# Patient Record
Sex: Male | Born: 1950 | Race: White | Hispanic: No | Marital: Married | Smoking: Former smoker
Health system: Southern US, Community
[De-identification: ages and names within clinical notes are randomized; demographics above are authoritative.]

## PROBLEM LIST (undated history)

## (undated) DIAGNOSIS — K5900 Constipation, unspecified: Secondary | ICD-10-CM

## (undated) DIAGNOSIS — I4510 Unspecified right bundle-branch block: Secondary | ICD-10-CM

## (undated) DIAGNOSIS — E785 Hyperlipidemia, unspecified: Secondary | ICD-10-CM

## (undated) DIAGNOSIS — E1140 Type 2 diabetes mellitus with diabetic neuropathy, unspecified: Secondary | ICD-10-CM

## (undated) DIAGNOSIS — R319 Hematuria, unspecified: Secondary | ICD-10-CM

## (undated) DIAGNOSIS — L0390 Cellulitis, unspecified: Secondary | ICD-10-CM

## (undated) DIAGNOSIS — I776 Arteritis, unspecified: Secondary | ICD-10-CM

## (undated) DIAGNOSIS — C679 Malignant neoplasm of bladder, unspecified: Secondary | ICD-10-CM

## (undated) DIAGNOSIS — E119 Type 2 diabetes mellitus without complications: Secondary | ICD-10-CM

## (undated) HISTORY — PX: TONSILLECTOMY: SUR1361

---

## 1978-05-28 HISTORY — PX: MOUTH SURGERY: SHX715

## 2008-03-01 ENCOUNTER — Ambulatory Visit: Payer: Self-pay | Admitting: Internal Medicine

## 2008-03-01 LAB — CONVERTED CEMR LAB
ALT: 22 units/L (ref 0–53)
Albumin: 4 g/dL (ref 3.5–5.2)
Basophils Absolute: 0 10*3/uL (ref 0.0–0.1)
Basophils Relative: 0.3 % (ref 0.0–3.0)
Calcium: 9.2 mg/dL (ref 8.4–10.5)
Creatinine, Ser: 0.9 mg/dL (ref 0.4–1.5)
Direct LDL: 131.8 mg/dL
Eosinophils Absolute: 0.3 10*3/uL (ref 0.0–0.7)
GFR calc Af Amer: 111 mL/min
HDL: 55.5 mg/dL (ref >39.0)
Hemoglobin, Urine: NEGATIVE
Leukocytes, UA: NEGATIVE
Monocytes Absolute: 0.7 10*3/uL (ref 0.1–1.0)
Neutro Abs: 6.4 10*3/uL (ref 1.4–7.7)
Neutrophils Relative %: 65 % (ref 43.0–77.0)
Platelets: 249 10*3/uL (ref 150–400)
RDW: 11.7 % (ref 11.5–14.6)
Specific Gravity, Urine: >=1.03 (ref 1.000–1.03)
Total CHOL/HDL Ratio: 3.9
Triglycerides: 140 mg/dL (ref 0–149)
Urine Glucose: >=1000 mg/dL — CR

## 2008-03-08 ENCOUNTER — Ambulatory Visit: Payer: Self-pay | Admitting: Internal Medicine

## 2008-03-08 DIAGNOSIS — E785 Hyperlipidemia, unspecified: Secondary | ICD-10-CM

## 2008-03-08 DIAGNOSIS — E1149 Type 2 diabetes mellitus with other diabetic neurological complication: Secondary | ICD-10-CM

## 2008-03-08 DIAGNOSIS — F17200 Nicotine dependence, unspecified, uncomplicated: Secondary | ICD-10-CM

## 2008-03-08 DIAGNOSIS — E1165 Type 2 diabetes mellitus with hyperglycemia: Secondary | ICD-10-CM

## 2015-01-17 ENCOUNTER — Ambulatory Visit (INDEPENDENT_AMBULATORY_CARE_PROVIDER_SITE_OTHER): Payer: Medicare HMO | Admitting: Family Medicine

## 2015-01-17 VITALS — BP 122/72 | HR 91 | Temp 98.1°F | Resp 17 | Ht 71.0 in | Wt 202.0 lb

## 2015-01-17 DIAGNOSIS — L03116 Cellulitis of left lower limb: Secondary | ICD-10-CM | POA: Diagnosis not present

## 2015-01-17 DIAGNOSIS — E118 Type 2 diabetes mellitus with unspecified complications: Secondary | ICD-10-CM

## 2015-01-17 LAB — POCT CBC
Granulocyte percent: 83.4 %G — AB (ref 37–80)
HEMATOCRIT: 51.2 % (ref 43.5–53.7)
HEMOGLOBIN: 16.9 g/dL (ref 14.1–18.1)
Lymph, poc: 1.6 (ref 0.6–3.4)
MCH: 30.2 pg (ref 27–31.2)
MCHC: 33 g/dL (ref 31.8–35.4)
MCV: 91.5 fL (ref 80–97)
MID (cbc): 0.3 (ref 0–0.9)
MPV: 7.2 fL (ref 0–99.8)
POC GRANULOCYTE: 9.4 — AB (ref 2–6.9)
POC LYMPH PERCENT: 13.9 %L (ref 10–50)
POC MID %: 2.7 % (ref 0–12)
Platelet Count, POC: 305 10*3/uL (ref 142–424)
RBC: 5.6 M/uL (ref 4.69–6.13)
RDW, POC: 13 %
WBC: 11.3 10*3/uL — AB (ref 4.6–10.2)

## 2015-01-17 LAB — BASIC METABOLIC PANEL
BUN: 30 mg/dL — AB (ref 7–25)
CO2: 26 mmol/L (ref 20–31)
CREATININE: 1.05 mg/dL (ref 0.70–1.25)
Calcium: 9.6 mg/dL (ref 8.6–10.3)
Chloride: 102 mmol/L (ref 98–110)
Glucose, Bld: 143 mg/dL — ABNORMAL HIGH (ref 65–99)
Potassium: 4.8 mmol/L (ref 3.5–5.3)
Sodium: 140 mmol/L (ref 135–146)

## 2015-01-17 LAB — POCT GLYCOSYLATED HEMOGLOBIN (HGB A1C): HEMOGLOBIN A1C: 7.1

## 2015-01-17 MED ORDER — MUPIROCIN 2 % EX OINT: TOPICAL_OINTMENT | CUTANEOUS | Status: DC

## 2015-01-17 MED ORDER — DOXYCYCLINE HYCLATE 100 MG PO CAPS: 100.0000 mg | ORAL_CAPSULE | Freq: Two times a day (BID) | ORAL | Status: DC

## 2015-01-17 MED ORDER — METFORMIN HCL 500 MG PO TABS: 500.0000 mg | ORAL_TABLET | Freq: Two times a day (BID) | ORAL | Status: DC

## 2015-01-17 NOTE — Progress Notes (Signed)
[facility] [address] [place] [address] [phone]  Date:  01/17/2015   Name:  Sergio Pennington   DOB:  1950/06/15   MRN:  [id number]  PCP:  Adella Hare, MD    Chief Complaint: Leg Injury and Insect Bite   History of Present Illness:  Sergio Pennington is a 64 y.o. very pleasant male patient who presents with the following:  Here today as a new patient with a couple of concerns A couple of months ago a piece of plywood fell and scraped the medial aspect of the left ankle and shin.  He treated it with peroxide, coconut oil, he thought it was getting better.  It never healed all the way, it left a scaly area on the shin.  However over the last 3-4 days it got a lot worse.  It is now red, tender, and weeping a bit.  He is able to walk but it does feel like "shin splints"   He also notes a bug bite on his right arm about 6 weeks ago-  It seemed to have a Bullseye around it and he was worried that it could be a tick.  It has now resolved except for a small red scaly mark at the site  He has a history of diet controlled DM- however admits that he has not followed up for routine care or A1c in some time, he has no idea how his numbers are now He has otherwise been a healthy person No other sx of lyme or RMSF  Patient Active Problem List   Diagnosis Date Noted  . DIAB W/O MENTION COMP TYPE II/UNS TYPE UNCNTRL 03/08/2008  . DIABETES MELLITUS, TYPE II, UNCONTROLLED, W/NEUROLO COMPS 03/08/2008  . HYPERLIPIDEMIA 03/08/2008  . TOBACCO ABUSE 03/08/2008    No past medical history on file.  No past surgical history on file.  Social History  Substance Use Topics  . Smoking status: Never Smoker   . Smokeless tobacco: None  . Alcohol Use: None    No family history on file.  Allergies  Allergen Reactions  . Sulfa Antibiotics Hives    Medication list has been reviewed and updated.  No current outpatient prescriptions on file  prior to visit.   No current facility-administered medications on file prior to visit.    Review of Systems:  As per HPI- otherwise negative.   Physical Examination: Filed Vitals:   01/17/15 1458  BP: 122/72  Pulse: 91  Temp: 98.1 F (36.7 C)  Resp: 17   Filed Vitals:   01/17/15 1458  Height: 5\' 11"  (1.803 m)  Weight: 202 lb (91.627 kg)   Body mass index is 28.19 kg/(m^2). Ideal Body Weight: Weight in (lb) to have BMI = 25: 178.9  GEN: WDWN, NAD, Non-toxic, A & O x 3, mild overweight, looks well HEENT: Atraumatic, Normocephalic. Neck supple. No masses, No LAD.  Bilateral TM wnl, oropharynx normal.  PEERL,EOMI.   Ears and Nose: No external deformity. CV: RRR, No M/G/R. No JVD. No thrill. No extra heart sounds. PULM: CTA B, no wheezes, crackles, rhonchi. No retractions. No resp. distress. No accessory muscle use. EXTR: No c/c/e NEURO Normal gait.  PSYCH: Normally interactive. Conversant. Not depressed or anxious appearing.  Calm demeanor.  Left shin, medial aspect: there is a dry and scaly patch approx 4in by 6in in size.  Part of this area is moist and weeping.  The entire area appears to be irritated and is red/  inflamed.  It is mildly tender.    Results for orders placed or performed in visit on 01/17/15  POCT CBC  Result Value Ref Range   WBC 11.3 (A) 4.6 - 10.2 K/uL   Lymph, poc 1.6 0.6 - 3.4   POC LYMPH PERCENT 13.9 10 - 50 %L   MID (cbc) 0.3 0 - 0.9   POC MID % 2.7 0 - 12 %M   POC Granulocyte 9.4 (A) 2 - 6.9   Granulocyte percent 83.4 (A) 37 - 80 %G   RBC 5.60 4.69 - 6.13 M/uL   Hemoglobin 16.9 14.1 - 18.1 g/dL   HCT, POC 51.2 43.5 - 53.7 %   MCV 91.5 80 - 97 fL   MCH, POC 30.2 27 - 31.2 pg   MCHC 33.0 31.8 - 35.4 g/dL   RDW, POC 13.0 %   Platelet Count, POC 305 142 - 424 K/uL   MPV 7.2 0 - 99.8 fL  POCT glycosylated hemoglobin (Hb A1C)  Result Value Ref Range   Hemoglobin A1C 7.1     Assessment and Plan: Cellulitis of leg, left - Plan: doxycycline  (VIBRAMYCIN) 100 MG capsule, POCT CBC, mupirocin ointment (BACTROBAN) 2 %  Diabetes mellitus type 2 with complications - Plan: POCT glycosylated hemoglobin (Hb A1C), metFORMIN (GLUCOPHAGE) 500 MG tablet, Basic metabolic panel  cellulitis and statis dermatitis of the left leg- dressed with bactroban and a protective dressing.   He will take doxycycline, come in for a recheck later this week Start on metformin for his DM- once daily for one week, then BID  Signed Lamar Blinks, MD

## 2015-01-17 NOTE — Patient Instructions (Addendum)
Apply the bactroban ointment to the weepy area on your ankle, and keep a non- stick dressing (may use vaseline gauze) covered by a protective dressing ove rthe wound You may also use petroleum jelly to moisturize the wound Take the doxycycline twice a day for 10 days for infection Let us know if you are not improving over the next few days- Sooner if worse.   Please come and see me Thursday or Friday to recheck your wound.  Start on the metformin for your diabetes- take it once a day in the evening for a week, then go to twice a day

## 2015-01-19 ENCOUNTER — Telehealth: Payer: Self-pay

## 2015-01-19 NOTE — Telephone Encounter (Signed)
Pt called asking should he come to be seen sooner due to his cellulitis causing "water blisters" on his skin. I advised him that this was unlikely and that he needed to come in ASAP. He will come to see Dr. Lorelei Pont tomorrow to be re-evaluated.

## 2015-01-20 ENCOUNTER — Ambulatory Visit (INDEPENDENT_AMBULATORY_CARE_PROVIDER_SITE_OTHER): Payer: Medicare HMO | Admitting: Family Medicine

## 2015-01-20 VITALS — BP 122/80 | HR 64 | Temp 98.2°F | Resp 16 | Ht 72.0 in | Wt 207.0 lb

## 2015-01-20 DIAGNOSIS — L299 Pruritus, unspecified: Secondary | ICD-10-CM | POA: Diagnosis not present

## 2015-01-20 DIAGNOSIS — L03116 Cellulitis of left lower limb: Secondary | ICD-10-CM

## 2015-01-20 MED ORDER — AMOXICILLIN-POT CLAVULANATE 875-125 MG PO TABS: 1.0000 | ORAL_TABLET | Freq: Two times a day (BID) | ORAL | Status: DC

## 2015-01-20 MED ORDER — TRIAMCINOLONE ACETONIDE 0.1 % EX CREA: 1.0000 "application " | TOPICAL_CREAM | Freq: Two times a day (BID) | CUTANEOUS | Status: DC

## 2015-01-20 NOTE — Patient Instructions (Signed)
We are going to change you over to augmentin from the doxycycline in case the itching is related  Continue to keep your ankle covered as needed However when you are at home wearing shorts you can leave the ankle open to air Please come and see me on Monday for a recheck  Let me know if any worsening or other concerns.   Use the triamcinolone cream as needed for itching   Results for orders placed or performed in visit on 123456  Basic metabolic panel  Result Value Ref Range   Sodium 140 135 - 146 mmol/L   Potassium 4.8 3.5 - 5.3 mmol/L   Chloride 102 98 - 110 mmol/L   CO2 26 20 - 31 mmol/L   Glucose, Bld 143 (H) 65 - 99 mg/dL   BUN 30 (H) 7 - 25 mg/dL   Creat 1.05 0.70 - 1.25 mg/dL   Calcium 9.6 8.6 - 10.3 mg/dL  POCT CBC  Result Value Ref Range   WBC 11.3 (A) 4.6 - 10.2 K/uL   Lymph, poc 1.6 0.6 - 3.4   POC LYMPH PERCENT 13.9 10 - 50 %L   MID (cbc) 0.3 0 - 0.9   POC MID % 2.7 0 - 12 %M   POC Granulocyte 9.4 (A) 2 - 6.9   Granulocyte percent 83.4 (A) 37 - 80 %G   RBC 5.60 4.69 - 6.13 M/uL   Hemoglobin 16.9 14.1 - 18.1 g/dL   HCT, POC 51.2 43.5 - 53.7 %   MCV 91.5 80 - 97 fL   MCH, POC 30.2 27 - 31.2 pg   MCHC 33.0 31.8 - 35.4 g/dL   RDW, POC 13.0 %   Platelet Count, POC 305 142 - 424 K/uL   MPV 7.2 0 - 99.8 fL  POCT glycosylated hemoglobin (Hb A1C)  Result Value Ref Range   Hemoglobin A1C 7.1

## 2015-01-20 NOTE — Progress Notes (Signed)
[facility] [address], [place] [address] [phone]  Date:  01/20/2015   Name:  Sergio Pennington   DOB:  [birth year]   MRN:  [id number]  PCP:  Adella Hare, MD    Chief Complaint: Follow-up; blisters on ankles; and Rash   History of Present Illness:  Sergio Pennington is a 64 y.o. very pleasant male patient who presents with the following:  Here today to recheck- he was seen on 8/22 for cellullits of the left shin.  He also had unmonitored DM We started him on doxycycline and bactroban for the leg.   A1c was just over 7%, started on metformin He is here today for a recheck- he feels like he is getting better Pain is less, however he has noted some "blisters" on his leg  He also has noted some itching of his right shin, and some itching of his chest.  Not sure if this might be related to the doxycycline.  He did have some itching prior to starting this medication but it has gotten worse  Patient Active Problem List   Diagnosis Date Noted  . DIAB W/O MENTION COMP TYPE II/UNS TYPE UNCNTRL 03/08/2008  . DIABETES MELLITUS, TYPE II, UNCONTROLLED, W/NEUROLO COMPS 03/08/2008  . HYPERLIPIDEMIA 03/08/2008  . TOBACCO ABUSE 03/08/2008    History reviewed. No pertinent past medical history.  History reviewed. No pertinent past surgical history.  Social History  Substance Use Topics  . Smoking status: Never Smoker   . Smokeless tobacco: None  . Alcohol Use: None    History reviewed. No pertinent family history.  Allergies  Allergen Reactions  . Sulfa Antibiotics Hives    Medication list has been reviewed and updated.  Current Outpatient Prescriptions on File Prior to Visit  Medication Sig Dispense Refill  . doxycycline (VIBRAMYCIN) 100 MG capsule Take 1 capsule (100 mg total) by mouth 2 (two) times daily. 20 capsule 0  . metFORMIN (GLUCOPHAGE) 500 MG tablet Take 1 tablet (500 mg total) by mouth 2 (two) times daily with a  meal. 180 tablet 3  . mupirocin ointment (BACTROBAN) 2 % Use on wound twice a day as needed 30 g 1   No current facility-administered medications on file prior to visit.    Review of Systems:  As per HPI- otherwise negative.   Physical Examination: Filed Vitals:   01/20/15 0852  BP: 122/80  Pulse: 64  Temp: 98.2 F (36.8 C)  Resp: 16   Filed Vitals:   01/20/15 0852  Height: 6' (1.829 m)  Weight: 207 lb (93.895 kg)   Body mass index is 28.07 kg/(m^2). Ideal Body Weight: Weight in (lb) to have BMI = 25: 183.9  GEN: WDWN, NAD, Non-toxic, A & O x 3, looks well HEENT: Atraumatic, Normocephalic. Neck supple. No masses, No LAD. Ears and Nose: No external deformity. CV: RRR, No M/G/R. No JVD. No thrill. No extra heart sounds. PULM: CTA B, no wheezes, crackles, rhonchi. No retractions. No resp. distress. No accessory muscle use. EXTR: No c/c/e NEURO Normal gait.  PSYCH: Normally interactive. Conversant. Not depressed or anxious appearing.  Calm demeanor.  Left ankle: the area of cellulitis is improving.  He does have a few clear blisters distal to the area that he has been wrapping.  However the weeping ulcer is healing, redness is less, heat is gone Dressed wound for him Small area of scaly rash on the right anterior shin, and a few bumps on his chest that  appear c/w folliculitis  Assessment and Plan: Cellulitis of leg, left - Plan: amoxicillin-clavulanate (AUGMENTIN) 875-125 MG per tablet  Itching - Plan: triamcinolone cream (KENALOG) 0.1 %  Changed to augmentin in case the doxy is causing itching He is using otc cortisone cream on some of the itchy areas He will come and see me next week for a follow-up- sooner if any concerns   Signed Lamar Blinks, MD

## 2015-01-24 ENCOUNTER — Ambulatory Visit (INDEPENDENT_AMBULATORY_CARE_PROVIDER_SITE_OTHER): Payer: Medicare HMO | Admitting: Family Medicine

## 2015-01-24 ENCOUNTER — Encounter: Payer: Self-pay | Admitting: Family Medicine

## 2015-01-24 VITALS — BP 133/74 | HR 72 | Temp 98.5°F | Resp 16 | Ht 72.0 in | Wt 205.0 lb

## 2015-01-24 DIAGNOSIS — L299 Pruritus, unspecified: Secondary | ICD-10-CM

## 2015-01-24 DIAGNOSIS — L97921 Non-pressure chronic ulcer of unspecified part of left lower leg limited to breakdown of skin: Secondary | ICD-10-CM

## 2015-01-24 MED ORDER — TRIAMCINOLONE ACETONIDE 0.1 % EX CREA: 1.0000 "application " | TOPICAL_CREAM | Freq: Two times a day (BID) | CUTANEOUS | Status: DC

## 2015-01-24 NOTE — Patient Instructions (Addendum)
If you cannot get in with Dr. Denna Haggard this week- in that case I will try.   We will see what Dr. Denna Haggard thinks, but we may also need to evaluate the blood flow to your leg.   It is possible that poor blood flow is contributing to your symptoms.   I will check your liver function and get back with you I refilled your steroid cream in a larger tube Let me know if you are getting worse

## 2015-01-24 NOTE — Progress Notes (Addendum)
[facility] [address]  [place] [address] [phone]  Date:  01/24/2015   Name:  Sergio Pennington   DOB:  March 17, 1951   MRN:  [id number]  PCP:  Adella Hare, MD    Chief Complaint: Follow-up and Ankle Problem   History of Present Illness:  Sergio Pennington is a 64 y.o. very pleasant male patient who presents with the following:  Here today to follow-up cellulitis of the left shin.  We changed from doxy to augmentin at our last visit as he had some itching.  However this does not seem to have improved and he continues to have itching on his limbs and trunk.  This is worst at night while he is trying to sleep  He has a history of DM on metformin only He sees Dr. Denna Haggard for derm; would be happy to go back and see him again if needed The area of the left ankle is less tender, but he continues to have some blisters with clear fluid. The skin continues to itch and crack.  He also has woody appearing hyperpigmentation superior to the ulcer on his left ankle, and some vasculitis type rash on the right ankle  Lab Results  Component Value Date   HGBA1C 7.1 01/17/2015     Patient Active Problem List   Diagnosis Date Noted  . DIAB W/O MENTION COMP TYPE II/UNS TYPE UNCNTRL 03/08/2008  . DIABETES MELLITUS, TYPE II, UNCONTROLLED, W/NEUROLO COMPS 03/08/2008  . HYPERLIPIDEMIA 03/08/2008  . TOBACCO ABUSE 03/08/2008    No past medical history on file.  No past surgical history on file.  Social History  Substance Use Topics  . Smoking status: Never Smoker   . Smokeless tobacco: None  . Alcohol Use: None    No family history on file.  Allergies  Allergen Reactions  . Sulfa Antibiotics Hives    Medication list has been reviewed and updated.  Current Outpatient Prescriptions on File Prior to Visit  Medication Sig Dispense Refill  . amoxicillin-clavulanate (AUGMENTIN) 875-125 MG per tablet Take 1 tablet by mouth 2 (two) times  daily. 20 tablet 0  . metFORMIN (GLUCOPHAGE) 500 MG tablet Take 1 tablet (500 mg total) by mouth 2 (two) times daily with a meal. 180 tablet 3  . mupirocin ointment (BACTROBAN) 2 % Use on wound twice a day as needed 30 g 1  . triamcinolone cream (KENALOG) 0.1 % Apply 1 application topically 2 (two) times daily. 30 g 0   No current facility-administered medications on file prior to visit.    Review of Systems:  As per HPI- otherwise negative.   Physical Examination: Filed Vitals:   01/24/15 1141  BP: 133/74  Pulse: 72  Temp: 98.5 F (36.9 C)  Resp: 16   Filed Vitals:   01/24/15 1141  Height: 6' (1.829 m)  Weight: 205 lb (92.987 kg)   Body mass index is 27.8 kg/(m^2). Ideal Body Weight: Weight in (lb) to have BMI = 25: 183.9  GEN: WDWN, NAD, Non-toxic, A & O x 3, looks well HEENT: Atraumatic, Normocephalic. Neck supple. No masses, No LAD. Ears and Nose: No external deformity. CV: RRR, No M/G/R. No JVD. No thrill. No extra heart sounds. PULM: CTA B, no wheezes, crackles, rhonchi. No retractions. No resp. distress. No accessory muscle use. ABD: S, NT, ND, +BS. No rebound. No HSM. EXTR: No c/c/e NEURO Normal gait.  PSYCH: Normally interactive. Conversant. Not depressed or anxious appearing.  Calm demeanor.  Left  ankle: the main ulcerated area appears a bit better, but generalized redness continues He felt all areas with monofilament on both feet, but the left toes appear slightly dusky compared to the right.  He has normal cap refill of the left toes Small patches of confluent vasculitis type rash on the right ankle, excoriations on his back  Assessment and Plan: Itching - Plan: triamcinolone cream (KENALOG) 0.1 %, Hepatic Function Panel  Lower extremity ulceration, left, limited to breakdown of skin  At this point we are not having much improvement of his wound.  Would like to consult dermatology.  Pt will call Dr. Delma Officer office but if not able to get an appt this week he  will call us.  Also consider ABI/ vascular consultation about possible PVD.  He will let me know if he needs anything, await liver function   Signed Lamar Blinks, MD  Called 8/31- got his wife.  Let her know that liver function normal She reports that he saw Dr. Denna Haggard yesterday and will see him again in a week for follow-up Results for orders placed or performed in visit on 01/24/15  Hepatic Function Panel  Result Value Ref Range   Total Bilirubin 0.4 0.2 - 1.2 mg/dL   Bilirubin, Direct 0.1 <=0.2 mg/dL   Indirect Bilirubin 0.3 0.2 - 1.2 mg/dL   Alkaline Phosphatase 52 40 - 115 U/L   AST 19 10 - 35 U/L   ALT 26 9 - 46 U/L   Total Protein 6.7 6.1 - 8.1 g/dL   Albumin 4.4 3.6 - 5.1 g/dL

## 2015-01-25 LAB — HEPATIC FUNCTION PANEL
ALBUMIN: 4.4 g/dL (ref 3.6–5.1)
ALK PHOS: 52 U/L (ref 40–115)
ALT: 26 U/L (ref 9–46)
AST: 19 U/L (ref 10–35)
BILIRUBIN INDIRECT: 0.3 mg/dL (ref 0.2–1.2)
Bilirubin, Direct: 0.1 mg/dL (ref <=0.2)
TOTAL PROTEIN: 6.7 g/dL (ref 6.1–8.1)
Total Bilirubin: 0.4 mg/dL (ref 0.2–1.2)

## 2015-01-26 ENCOUNTER — Ambulatory Visit: Payer: Self-pay | Admitting: Family

## 2016-02-14 ENCOUNTER — Other Ambulatory Visit: Payer: Self-pay | Admitting: Family Medicine

## 2016-02-14 DIAGNOSIS — E118 Type 2 diabetes mellitus with unspecified complications: Secondary | ICD-10-CM

## 2016-02-17 ENCOUNTER — Other Ambulatory Visit: Payer: Self-pay | Admitting: Emergency Medicine

## 2016-02-17 DIAGNOSIS — E118 Type 2 diabetes mellitus with unspecified complications: Secondary | ICD-10-CM

## 2016-02-17 MED ORDER — METFORMIN HCL 500 MG PO TABS: 500.0000 mg | ORAL_TABLET | Freq: Two times a day (BID) | ORAL | 0 refills | Status: DC

## 2016-03-30 ENCOUNTER — Other Ambulatory Visit: Payer: Self-pay | Admitting: Family Medicine

## 2016-03-30 DIAGNOSIS — E118 Type 2 diabetes mellitus with unspecified complications: Secondary | ICD-10-CM

## 2016-05-16 ENCOUNTER — Ambulatory Visit (INDEPENDENT_AMBULATORY_CARE_PROVIDER_SITE_OTHER): Payer: Medicare HMO | Admitting: Family Medicine

## 2016-05-16 ENCOUNTER — Encounter: Payer: Self-pay | Admitting: Family Medicine

## 2016-05-16 VITALS — BP 130/85 | HR 64 | Temp 98.0°F | Ht 72.0 in | Wt 206.6 lb

## 2016-05-16 DIAGNOSIS — Z119 Encounter for screening for infectious and parasitic diseases, unspecified: Secondary | ICD-10-CM

## 2016-05-16 DIAGNOSIS — L299 Pruritus, unspecified: Secondary | ICD-10-CM | POA: Diagnosis not present

## 2016-05-16 DIAGNOSIS — E785 Hyperlipidemia, unspecified: Secondary | ICD-10-CM | POA: Diagnosis not present

## 2016-05-16 DIAGNOSIS — E118 Type 2 diabetes mellitus with unspecified complications: Secondary | ICD-10-CM

## 2016-05-16 LAB — COMPREHENSIVE METABOLIC PANEL
ALT: 33 U/L (ref 0–53)
AST: 22 U/L (ref 0–37)
Albumin: 4.5 g/dL (ref 3.5–5.2)
Alkaline Phosphatase: 52 U/L (ref 39–117)
BILIRUBIN TOTAL: 0.6 mg/dL (ref 0.2–1.2)
BUN: 24 mg/dL — ABNORMAL HIGH (ref 6–23)
CALCIUM: 9.5 mg/dL (ref 8.4–10.5)
CHLORIDE: 103 meq/L (ref 96–112)
CO2: 29 meq/L (ref 19–32)
Creatinine, Ser: 1.06 mg/dL (ref 0.40–1.50)
GFR: 73.87 mL/min (ref >60.00)
GLUCOSE: 211 mg/dL — AB (ref 70–99)
Potassium: 4.5 mEq/L (ref 3.5–5.1)
Sodium: 139 mEq/L (ref 135–145)
Total Protein: 6.8 g/dL (ref 6.0–8.3)

## 2016-05-16 LAB — LIPID PANEL
CHOL/HDL RATIO: 3
Cholesterol: 188 mg/dL (ref 0–200)
HDL: 54.8 mg/dL (ref >39.00)
LDL CALC: 109 mg/dL — AB (ref 0–99)
NONHDL: 133.43
TRIGLYCERIDES: 124 mg/dL (ref 0.0–149.0)
VLDL: 24.8 mg/dL (ref 0.0–40.0)

## 2016-05-16 LAB — MICROALBUMIN / CREATININE URINE RATIO
Creatinine, Urine: 170 mg/dL (ref 20–370)
MICROALB UR: 1.7 mg/dL
Microalb Creat Ratio: 10 mcg/mg creat (ref <30)

## 2016-05-16 LAB — HEPATITIS C ANTIBODY: HCV Ab: NEGATIVE

## 2016-05-16 LAB — HEMOGLOBIN A1C: Hgb A1c MFr Bld: 8.2 % — ABNORMAL HIGH (ref 4.6–6.5)

## 2016-05-16 MED ORDER — METFORMIN HCL 500 MG PO TABS: ORAL_TABLET | ORAL | 3 refills | Status: DC

## 2016-05-16 MED ORDER — TRIAMCINOLONE ACETONIDE 0.1 % EX CREA: 1.0000 "application " | TOPICAL_CREAM | Freq: Two times a day (BID) | CUTANEOUS | 1 refills | Status: DC

## 2016-05-16 NOTE — Progress Notes (Signed)
Pre visit review using our clinic review tool, if applicable. No additional management support is needed unless otherwise documented below in the visit note. 

## 2016-05-16 NOTE — Addendum Note (Signed)
Addended by: Emi Holes on: 05/16/2016 05:30 PM   Modules accepted: Orders
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
## 2016-05-16 NOTE — Patient Instructions (Signed)
Thanks for waiting so patiently through our computer problems today!  I refilled your metfomin and your steroid cream I will be in touch with your labs asap
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
## 2017-06-27 ENCOUNTER — Ambulatory Visit (INDEPENDENT_AMBULATORY_CARE_PROVIDER_SITE_OTHER): Payer: Medicare Other | Admitting: Family Medicine

## 2017-06-27 ENCOUNTER — Encounter: Payer: Self-pay | Admitting: Family Medicine

## 2017-06-27 VITALS — BP 124/70 | HR 60 | Temp 97.8°F | Wt 196.4 lb

## 2017-06-27 DIAGNOSIS — E1169 Type 2 diabetes mellitus with other specified complication: Secondary | ICD-10-CM

## 2017-06-27 DIAGNOSIS — E785 Hyperlipidemia, unspecified: Secondary | ICD-10-CM

## 2017-06-27 DIAGNOSIS — E1165 Type 2 diabetes mellitus with hyperglycemia: Secondary | ICD-10-CM

## 2017-06-27 DIAGNOSIS — Z125 Encounter for screening for malignant neoplasm of prostate: Secondary | ICD-10-CM

## 2017-06-27 DIAGNOSIS — E119 Type 2 diabetes mellitus without complications: Secondary | ICD-10-CM | POA: Diagnosis not present

## 2017-06-27 DIAGNOSIS — IMO0001 Reserved for inherently not codable concepts without codable children: Secondary | ICD-10-CM

## 2017-06-27 DIAGNOSIS — L299 Pruritus, unspecified: Secondary | ICD-10-CM | POA: Diagnosis not present

## 2017-06-27 LAB — MICROALBUMIN / CREATININE URINE RATIO
Creatinine,U: 62.4 mg/dL
MICROALB UR: 1.5 mg/dL (ref 0.0–1.9)
MICROALB/CREAT RATIO: 2.5 mg/g (ref 0.0–30.0)

## 2017-06-27 LAB — COMPREHENSIVE METABOLIC PANEL
ALBUMIN: 4.4 g/dL (ref 3.5–5.2)
ALK PHOS: 63 U/L (ref 39–117)
ALT: 19 U/L (ref 0–53)
AST: 16 U/L (ref 0–37)
BUN: 21 mg/dL (ref 6–23)
CO2: 31 mEq/L (ref 19–32)
CREATININE: 0.91 mg/dL (ref 0.40–1.50)
Calcium: 9.2 mg/dL (ref 8.4–10.5)
Chloride: 102 mEq/L (ref 96–112)
GFR: 87.8 mL/min (ref >60.00)
Glucose, Bld: 170 mg/dL — ABNORMAL HIGH (ref 70–99)
POTASSIUM: 4.8 meq/L (ref 3.5–5.1)
SODIUM: 139 meq/L (ref 135–145)
TOTAL PROTEIN: 6.9 g/dL (ref 6.0–8.3)
Total Bilirubin: 0.7 mg/dL (ref 0.2–1.2)

## 2017-06-27 LAB — LIPID PANEL
Cholesterol: 185 mg/dL (ref 0–200)
HDL: 54.3 mg/dL (ref >39.00)
LDL Cholesterol: 110 mg/dL — ABNORMAL HIGH (ref 0–99)
NONHDL: 130.26
Total CHOL/HDL Ratio: 3
Triglycerides: 102 mg/dL (ref 0.0–149.0)
VLDL: 20.4 mg/dL (ref 0.0–40.0)

## 2017-06-27 LAB — CBC
HEMATOCRIT: 46.5 % (ref 39.0–52.0)
Hemoglobin: 16 g/dL (ref 13.0–17.0)
MCHC: 34.5 g/dL (ref 30.0–36.0)
MCV: 92.1 fl (ref 78.0–100.0)
PLATELETS: 251 10*3/uL (ref 150.0–400.0)
RBC: 5.04 Mil/uL (ref 4.22–5.81)
RDW: 12.8 % (ref 11.5–15.5)
WBC: 7.8 10*3/uL (ref 4.0–10.5)

## 2017-06-27 LAB — HEMOGLOBIN A1C: HEMOGLOBIN A1C: 6.6 % — AB (ref 4.6–6.5)

## 2017-06-27 LAB — PSA, MEDICARE: PSA: 0.46 ng/mL (ref 0.10–4.00)

## 2017-06-27 MED ORDER — METFORMIN HCL 500 MG PO TABS: ORAL_TABLET | ORAL | 3 refills | Status: DC

## 2017-06-27 MED ORDER — TRIAMCINOLONE ACETONIDE 0.1 % EX CREA: 1.0000 "application " | TOPICAL_CREAM | Freq: Two times a day (BID) | CUTANEOUS | 1 refills | Status: DC

## 2017-06-27 NOTE — Patient Instructions (Addendum)
Always a pleasure to see you!  I will be in touch with your labs asap Please continue to stay active and try to eat a healthful diet   I still encourage you to have your flu, pneumonia and shingles vaccines!

## 2017-07-23 ENCOUNTER — Other Ambulatory Visit: Payer: Self-pay | Admitting: Family Medicine

## 2017-07-23 DIAGNOSIS — E119 Type 2 diabetes mellitus without complications: Secondary | ICD-10-CM

## 2018-04-09 ENCOUNTER — Other Ambulatory Visit: Payer: Self-pay | Admitting: Family Medicine

## 2018-04-09 DIAGNOSIS — L299 Pruritus, unspecified: Secondary | ICD-10-CM

## 2018-04-15 ENCOUNTER — Other Ambulatory Visit: Payer: Self-pay | Admitting: Family Medicine

## 2018-04-15 DIAGNOSIS — L299 Pruritus, unspecified: Secondary | ICD-10-CM

## 2018-04-21 ENCOUNTER — Emergency Department (HOSPITAL_COMMUNITY)
Admission: EM | Admit: 2018-04-21 | Discharge: 2018-04-21 | Disposition: A | Discharge status: Home / Self Care | Payer: Medicare Other | Attending: Emergency Medicine | Admitting: Emergency Medicine

## 2018-04-21 ENCOUNTER — Encounter (HOSPITAL_COMMUNITY): Payer: Self-pay

## 2018-04-21 ENCOUNTER — Ambulatory Visit: Payer: Self-pay

## 2018-04-21 DIAGNOSIS — H1131 Conjunctival hemorrhage, right eye: Secondary | ICD-10-CM | POA: Diagnosis not present

## 2018-04-21 DIAGNOSIS — N3001 Acute cystitis with hematuria: Secondary | ICD-10-CM | POA: Diagnosis not present

## 2018-04-21 DIAGNOSIS — Z7984 Long term (current) use of oral hypoglycemic drugs: Secondary | ICD-10-CM | POA: Diagnosis not present

## 2018-04-21 DIAGNOSIS — R310 Gross hematuria: Secondary | ICD-10-CM | POA: Diagnosis not present

## 2018-04-21 DIAGNOSIS — E119 Type 2 diabetes mellitus without complications: Secondary | ICD-10-CM | POA: Diagnosis not present

## 2018-04-21 DIAGNOSIS — R319 Hematuria, unspecified: Secondary | ICD-10-CM | POA: Diagnosis present

## 2018-04-21 HISTORY — DX: Type 2 diabetes mellitus without complications: E11.9

## 2018-04-21 HISTORY — DX: Hematuria, unspecified: R31.9

## 2018-04-21 LAB — CBC WITH DIFFERENTIAL/PLATELET
Abs Immature Granulocytes: 0.06 10*3/uL (ref 0.00–0.07)
BASOS PCT: 1 %
Basophils Absolute: 0.1 10*3/uL (ref 0.0–0.1)
EOS ABS: 0.5 10*3/uL (ref 0.0–0.5)
Eosinophils Relative: 4 %
HEMATOCRIT: 44 % (ref 39.0–52.0)
Hemoglobin: 14.8 g/dL (ref 13.0–17.0)
Immature Granulocytes: 1 %
LYMPHS ABS: 1.8 10*3/uL (ref 0.7–4.0)
Lymphocytes Relative: 16 %
MCH: 31 pg (ref 26.0–34.0)
MCHC: 33.6 g/dL (ref 30.0–36.0)
MCV: 92.2 fL (ref 80.0–100.0)
MONO ABS: 1.1 10*3/uL — AB (ref 0.1–1.0)
MONOS PCT: 9 %
Neutro Abs: 7.9 10*3/uL — ABNORMAL HIGH (ref 1.7–7.7)
Neutrophils Relative %: 69 %
PLATELETS: 301 10*3/uL (ref 150–400)
RBC: 4.77 MIL/uL (ref 4.22–5.81)
RDW: 12.3 % (ref 11.5–15.5)
WBC: 11.4 10*3/uL — ABNORMAL HIGH (ref 4.0–10.5)
nRBC: 0 % (ref 0.0–0.2)

## 2018-04-21 LAB — URINALYSIS, MICROSCOPIC (REFLEX)
RBC / HPF: >50 RBC/hpf (ref 0–5)
Squamous Epithelial / LPF: NONE SEEN (ref 0–5)
WBC, UA: >50 WBC/hpf (ref 0–5)

## 2018-04-21 LAB — URINALYSIS, ROUTINE W REFLEX MICROSCOPIC
BACTERIA UA: NONE SEEN
BILIRUBIN URINE: NEGATIVE
GLUCOSE, UA: 50 mg/dL — AB
KETONES UR: NEGATIVE mg/dL
LEUKOCYTES UA: NEGATIVE
Nitrite: POSITIVE — AB
PH: 5 (ref 5.0–8.0)
PROTEIN: 30 mg/dL — AB
Specific Gravity, Urine: 1.025 (ref 1.005–1.030)

## 2018-04-21 LAB — BASIC METABOLIC PANEL
ANION GAP: 8 (ref 5–15)
BUN: 24 mg/dL — ABNORMAL HIGH (ref 8–23)
CALCIUM: 9.3 mg/dL (ref 8.9–10.3)
CO2: 28 mmol/L (ref 22–32)
Chloride: 105 mmol/L (ref 98–111)
Creatinine, Ser: 0.94 mg/dL (ref 0.61–1.24)
GFR calc Af Amer: >60 mL/min (ref >60)
GLUCOSE: 152 mg/dL — AB (ref 70–99)
Potassium: 4.2 mmol/L (ref 3.5–5.1)
Sodium: 141 mmol/L (ref 135–145)

## 2018-04-21 MED ORDER — CEPHALEXIN 500 MG PO CAPS
500.0000 mg | ORAL_CAPSULE | Freq: Once | ORAL | Status: AC
  Administered 2018-04-21: 500 mg via ORAL
  Filled 2018-04-21: qty 1

## 2018-04-21 MED ORDER — CEPHALEXIN 500 MG PO CAPS: 500.0000 mg | ORAL_CAPSULE | Freq: Two times a day (BID) | ORAL | 0 refills | Status: DC

## 2018-04-21 NOTE — ED Notes (Signed)
Pt advised that PA has ordered blood work and that they will call him for lab work from [facility]. Pt is calm and cooperative

## 2018-04-21 NOTE — ED Provider Notes (Signed)
[facility] DEPT Provider Note   CSN: [id number] Arrival date & time: 04/21/18  1442     History   Chief Complaint Chief Complaint  Patient presents with  . Hematuria    HPI Sergio Pennington is a 67 y.o. male.  Patient is a 67 year old male who presents with blood in his urine.  He has a history of diabetes and hyperlipidemia.  He is not on anticoagulants.  He noted some blood in his urine starting this morning.  He says at times he will pass little clots but over the last few hours it seems to have cleared up and now is having more clear urine.  He is able to urinate okay and does not feel like he is retaining urine.  He has a little bit of discomfort in his suprapubic area when he urinates.  He has no nausea or vomiting.  No fevers.  No history of similar symptoms in the past.  He says over the last 6 to 8 months he has had frequent urination and urgency.  He states he has to urinate about 20 times a day and several times at night.  He has not seen his physician for this.  He also says he has a little spot of redness in his right eye that was pointed out to him by someone today.  He has not noticed it prior to that.  He has no eye pain.  No vision changes.  No drainage from the eye.  No eye irritation.  No trauma to the eye.     Past Medical History:  Diagnosis Date  . Diabetes mellitus without complication [facility])     Patient Active Problem List   Diagnosis Date Noted  . DIAB W/O MENTION COMP TYPE II/UNS TYPE UNCNTRL 03/08/2008  . DIABETES MELLITUS, TYPE II, UNCONTROLLED, W/NEUROLO COMPS 03/08/2008  . HYPERLIPIDEMIA 03/08/2008  . TOBACCO ABUSE 03/08/2008    History reviewed. No pertinent surgical history.      Home Medications    Prior to Admission medications   Medication Sig Start Date End Date Taking? Authorizing Provider  Cholecalciferol (VITAMIN D3 PO) Take 4 capsules by mouth daily.   Yes [provider]    Cyanocobalamin (VITAMIN B-12 PO) Take 5,000 mcg by mouth daily.   Yes [provider]  diphenhydrAMINE (BENADRYL) 25 MG tablet Take 25 mg by mouth every 6 (six) hours as needed for itching.   Yes [provider]  Menaquinone-7 (VITAMIN K2 PO) Take 1 capsule by mouth daily.   Yes [provider]  metFORMIN (GLUCOPHAGE) 500 MG tablet TAKE ONE TABLET BY MOUTH TWICE A DAY WITH A MEAL 06/27/17  Yes [name], [name], MD  Taurine 500 MG CAPS Take 500 mg by mouth daily.   Yes [provider]  triamcinolone cream (KENALOG) 0.1 % APPLY TO AFFECTED AREA TWICE A DAY Patient taking differently: Apply 1 application topically 2 (two) times daily as needed (itching).  04/10/18  Yes [name], [name], MD  cephALEXin (KEFLEX) 500 MG capsule Take 1 capsule (500 mg total) by mouth 2 (two) times daily. 04/21/18   Malvin Johns, MD    Family History History reviewed. No pertinent family history.  Social History Social History   Tobacco Use  . Smoking status: Never Smoker  . Smokeless tobacco: Never Used  Substance Use Topics  . Alcohol use: Yes    Alcohol/week: 1.0 standard drinks    Types: 1 Glasses of wine per week  .  Drug use: Not on file     Allergies   Sulfa antibiotics   Review of Systems Review of Systems  Constitutional: Negative for chills, diaphoresis, fatigue and fever.  HENT: Negative for congestion, rhinorrhea and sneezing.   Eyes: Positive for redness.  Respiratory: Negative for cough, chest tightness and shortness of breath.   Cardiovascular: Negative for chest pain and leg swelling.  Gastrointestinal: Positive for abdominal pain. Negative for blood in stool, diarrhea, nausea and vomiting.  Genitourinary: Positive for hematuria. Negative for difficulty urinating, flank pain, frequency, penile swelling, scrotal swelling and testicular pain.  Musculoskeletal: Negative for arthralgias and back pain.  Skin: Negative for rash.  Neurological:  Negative for dizziness, speech difficulty, weakness, numbness and headaches.     Physical Exam Updated Vital Signs BP (!) 154/83 (BP Location: Left Arm)   Pulse 77   Temp 98.6 F (37 C) (Oral)   Resp 16   SpO2 97%   Physical Exam  Constitutional: He is oriented to person, place, and time. He appears well-developed and well-nourished.  HENT:  Head: Normocephalic and atraumatic.  Eyes: Pupils are equal, round, and reactive to light. EOM are normal. Right eye exhibits no discharge. Left eye exhibits no discharge.  Patient has a small subconjunctival hemorrhage to the medial aspect of the right eye.  There is no drainage from the eye.  Neck: Normal range of motion. Neck supple.  Cardiovascular: Normal rate, regular rhythm and normal heart sounds.  Pulmonary/Chest: Effort normal and breath sounds normal. No respiratory distress. He has no wheezes. He has no rales. He exhibits no tenderness.  Abdominal: Soft. Bowel sounds are normal. There is no tenderness. There is no rebound and no guarding.  Genitourinary:  Genitourinary Comments: Normal-appearing male genitalia with a circumcised penis.  There is no bleeding.  There is no pain in the testicular area or inguinal canals.  Musculoskeletal: Normal range of motion. He exhibits no edema.  Lymphadenopathy:    He has no cervical adenopathy.  Neurological: He is alert and oriented to person, place, and time.  Skin: Skin is warm and dry. No rash noted.  Psychiatric: He has a normal mood and affect.     ED Treatments / Results  Labs (all labs ordered are listed, but only abnormal results are displayed) Labs Reviewed  URINALYSIS, ROUTINE W REFLEX MICROSCOPIC - Abnormal; Notable for the following components:      Result Value   Color, Urine RED (*)    APPearance TURBID (*)    Glucose, UA 50 (*)    Hgb urine dipstick MODERATE (*)    Protein, ur 30 (*)    Nitrite POSITIVE (*)    All other components within normal limits  BASIC METABOLIC  PANEL - Abnormal; Notable for the following components:   Glucose, Bld 152 (*)    BUN 24 (*)    All other components within normal limits  CBC WITH DIFFERENTIAL/PLATELET - Abnormal; Notable for the following components:   WBC 11.4 (*)    Neutro Abs 7.9 (*)    Monocytes Absolute 1.1 (*)    All other components within normal limits  URINALYSIS, MICROSCOPIC (REFLEX) - Abnormal; Notable for the following components:   Bacteria, UA MANY (*)    All other components within normal limits  URINE CULTURE    EKG None  Radiology No results found.  Procedures Procedures (including critical care time)  Medications Ordered in ED Medications  cephALEXin (KEFLEX) capsule 500 mg (has no administration in time range)  Initial Impression / Assessment and Plan / ED Course  I have reviewed the triage vital signs and the nursing notes.  Pertinent labs & imaging results that were available during my care of the patient were reviewed by me and considered in my medical decision making (see chart for details).     Patient presents with hematuria.  His urine appears to be infected which is likely the etiology for the hematuria.  There is no suggestions of retention.  His creatinine is normal.  His hemoglobin is normal.  He has a small subconjunctival hemorrhage which does not seem to be bothering him at all.  He has no other reports of bleeding.  No GI bleeding.  No nosebleeds.  He is not on anticoagulants.  He was started on Keflex.  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  This may be the etiology of the infection.  I encouraged him to have follow-up with urology.  He was given strict return precautions.  He was advised to drink water during the day to flush his urine out.  Final Clinical Impressions(s) / ED Diagnoses   Final diagnoses:  Acute cystitis with hematuria  Gross hematuria  Subconjunctival hemorrhage of right eye    ED Discharge Orders         Ordered    cephALEXin  (KEFLEX) 500 MG capsule  2 times daily     04/21/18 2206           Malvin Johns, MD 04/21/18 2218

## 2018-04-21 NOTE — ED Triage Notes (Signed)
Patient c/o of blood in his urine. Blood clots passed today with significant amount of blood. Patient c/o of his right eye being red. Denies painful urination. Patient states he has frequent urination X6 months.

## 2018-04-21 NOTE — Telephone Encounter (Signed)
Pt called with C/O passing clots in his urine.  The symptoms started today. He states he has some urgency and went to relieve his bladder and it was like a balloon bursting and he expelled a lot of blood and then the balloon (clot)came out. He states he is going frequently but does not feel that he is emptying. He has rt back into his hip pain. The pain is mild.  Pt denies taking anticoagulants. Per protocol pt will go to the ED for evaluation. Care advice read to patient Pt verbalized understanding.  Reason for Disposition . Passing pure blood or large blood clots (i.e., size > a dime) (Exception: fleck or small strands)  Answer Assessment - Initial Assessment Questions 1. COLOR of URINE: "Describe the color of the urine."  (e.g., tea-colored, pink, red, blood clots, bloody)     blood clots, 2. ONSET: "When did the bleeding start?"      today 3. EPISODES: "How many times has there been blood in the urine?" or "How many times today?"    4 4. PAIN with URINATION: "Is there any pain with passing your urine?" If so, ask: "How bad is the pain?"  (Scale 1-10; or mild, moderate, severe)    - MILD - complains slightly about urination hurting    - MODERATE - interferes with normal activities      - SEVERE - excruciating, unwilling or unable to urinate because of the pain      mild 5. FEVER: "Do you have a fever?" If so, ask: "What is your temperature, how was it measured, and when did it start?"     no 6. ASSOCIATED SYMPTOMS: "Are you passing urine more frequently than usual?"     Frequency, retention 7. OTHER SYMPTOMS: "Do you have any other symptoms?" (e.g., back/flank pain, abdominal pain, vomiting)     Back rt hip 8. PREGNANCY: "Is there any chance you are pregnant?" "When was your last menstrual period?"     N/A  Protocols used: URINE - BLOOD IN-A-AH

## 2018-04-23 LAB — URINE CULTURE: Culture: <10000 — AB

## 2018-06-09 ENCOUNTER — Other Ambulatory Visit: Payer: Self-pay | Admitting: Urology

## 2018-06-10 ENCOUNTER — Encounter (HOSPITAL_COMMUNITY): Payer: Self-pay

## 2018-06-10 NOTE — Patient Instructions (Addendum)
Your procedure is scheduled on: Monday, Jan. 20, 2020   Surgery Time:  7:30AM-8:39 AM   Report to [facility]  Entrance    Report to admitting at 5:30 AM   Call this number if you have problems the morning of surgery [phone]   Do not eat food or drink liquids :After Midnight.   Brush your teeth the morning of surgery.   Do NOT smoke after Midnight   Take these medicines the morning of surgery with A SIP OF WATER: None  DO NOT TAKE ANY DIABETIC MEDICATIONS DAY OF YOUR SURGERY                               You may not have any metal on your body including  jewelry, and body piercings             Do not wear lotions, powders, perfumes/cologne, or deodorant                         Men may shave face and neck.   Do not bring valuables to the hospital. [place].   Contacts, dentures or bridgework may not be worn into surgery.   Leave suitcase in the car. After surgery it may be brought to your room.   Special Instructions: Bring a copy of your healthcare power of attorney and living will documents         the day of surgery if you haven't scanned them in before.              Please read over the following fact sheets you were given:  [facility] - Preparing for Surgery Before surgery, you can play an important role.  Because skin is not sterile, your skin needs to be as free of germs as possible.  You can reduce the number of germs on your skin by washing with CHG (chlorahexidine gluconate) soap before surgery.  CHG is an antiseptic cleaner which kills germs and bonds with the skin to continue killing germs even after washing. Please DO NOT use if you have an allergy to CHG or antibacterial soaps.  If your skin becomes reddened/irritated stop using the CHG and inform your nurse when you arrive at Short Stay. Do not shave (including legs and underarms) for at least 48 hours prior to the first CHG shower.  You  may shave your face/neck.  Please follow these instructions carefully:  1.  Shower with CHG Soap the night before surgery and the  morning of surgery.  2.  If you choose to wash your hair, wash your hair first as usual with your normal  shampoo.  3.  After you shampoo, rinse your hair and body thoroughly to remove the shampoo.                             4.  Use CHG as you would any other liquid soap.  You can apply chg directly to the skin and wash.  Gently with a scrungie or clean washcloth.  5.  Apply the CHG Soap to your body ONLY FROM THE NECK DOWN.   Do   not use on face/ open  Wound or open sores. Avoid contact with eyes, ears mouth and   genitals (private parts).                       Wash face,  Genitals (private parts) with your normal soap.             6.  Wash thoroughly, paying special attention to the area where your    surgery  will be performed.  7.  Thoroughly rinse your body with warm water from the neck down.  8.  DO NOT shower/wash with your normal soap after using and rinsing off the CHG Soap.                9.  Pat yourself dry with a clean towel.            10.  Wear clean pajamas.            11.  Place clean sheets on your bed the night of your first shower and do not  sleep with pets. Day of Surgery : Do not apply any lotions/deodorants the morning of surgery.  Please wear clean clothes to the hospital/surgery center.  FAILURE TO FOLLOW THESE INSTRUCTIONS MAY RESULT IN THE CANCELLATION OF YOUR SURGERY  PATIENT SIGNATURE_________________________________  NURSE SIGNATURE__________________________________  ________________________________________________________________________  WHAT IS A BLOOD TRANSFUSION? Blood Transfusion Information  A transfusion is the replacement of blood or some of its parts. Blood is made up of multiple cells which provide different functions.  Red blood cells carry oxygen and are used for blood loss  replacement.  White blood cells fight against infection.  Platelets control bleeding.  Plasma helps clot blood.  Other blood products are available for specialized needs, such as hemophilia or other clotting disorders. BEFORE THE TRANSFUSION  Who gives blood for transfusions?   Healthy volunteers who are fully evaluated to make sure their blood is safe. This is blood bank blood. Transfusion therapy is the safest it has ever been in the practice of medicine. Before blood is taken from a donor, a complete history is taken to make sure that person has no history of diseases nor engages in risky social behavior (examples are intravenous drug use or sexual activity with multiple partners). The donor's travel history is screened to minimize risk of transmitting infections, such as malaria. The donated blood is tested for signs of infectious diseases, such as HIV and hepatitis. The blood is then tested to be sure it is compatible with you in order to minimize the chance of a transfusion reaction. If you or a relative donates blood, this is often done in anticipation of surgery and is not appropriate for emergency situations. It takes many days to process the donated blood. RISKS AND COMPLICATIONS Although transfusion therapy is very safe and saves many lives, the main dangers of transfusion include:   Getting an infectious disease.  Developing a transfusion reaction. This is an allergic reaction to something in the blood you were given. Every precaution is taken to prevent this. The decision to have a blood transfusion has been considered carefully by your caregiver before blood is given. Blood is not given unless the benefits outweigh the risks. AFTER THE TRANSFUSION  Right after receiving a blood transfusion, you will usually feel much better and more energetic. This is especially true if your red blood cells have gotten low (anemic). The transfusion raises the level of the red blood cells which  carry oxygen, and this usually  causes an energy increase.  The nurse administering the transfusion will monitor you carefully for complications. HOME CARE INSTRUCTIONS  No special instructions are needed after a transfusion. You may find your energy is better. Speak with your caregiver about any limitations on activity for underlying diseases you may have. SEEK MEDICAL CARE IF:   Your condition is not improving after your transfusion.  You develop redness or irritation at the intravenous (IV) site. SEEK IMMEDIATE MEDICAL CARE IF:  Any of the following symptoms occur over the next 12 hours:  Shaking chills.  You have a temperature by mouth above 102 F (38.9 C), not controlled by medicine.  Chest, back, or muscle pain.  People around you feel you are not acting correctly or are confused.  Shortness of breath or difficulty breathing.  Dizziness and fainting.  You get a rash or develop hives.  You have a decrease in urine output.  Your urine turns a dark color or changes to pink, red, or brown. Any of the following symptoms occur over the next 10 days:  You have a temperature by mouth above 102 F (38.9 C), not controlled by medicine.  Shortness of breath.  Weakness after normal activity.  The white part of the eye turns yellow (jaundice).  You have a decrease in the amount of urine or are urinating less often.  Your urine turns a dark color or changes to pink, red, or brown. Document Released: 05/11/2000 Document Revised: 08/06/2011 Document Reviewed: 12/29/2007 [facility] Patient Information 2014 [place], [place].  _______________________________________________________________________

## 2018-06-13 ENCOUNTER — Other Ambulatory Visit: Payer: Self-pay

## 2018-06-13 ENCOUNTER — Encounter (HOSPITAL_COMMUNITY)
Admission: RE | Admit: 2018-06-13 | Discharge: 2018-06-13 | Disposition: A | Discharge status: Home / Self Care | Payer: Medicare Other | Source: Ambulatory Visit | Attending: Urology | Admitting: Urology

## 2018-06-13 ENCOUNTER — Encounter (HOSPITAL_COMMUNITY): Payer: Self-pay

## 2018-06-13 DIAGNOSIS — Z01818 Encounter for other preprocedural examination: Secondary | ICD-10-CM | POA: Insufficient documentation

## 2018-06-13 DIAGNOSIS — Z8042 Family history of malignant neoplasm of prostate: Secondary | ICD-10-CM | POA: Diagnosis not present

## 2018-06-13 DIAGNOSIS — Z87891 Personal history of nicotine dependence: Secondary | ICD-10-CM | POA: Diagnosis not present

## 2018-06-13 DIAGNOSIS — N35911 Unspecified urethral stricture, male, meatal: Secondary | ICD-10-CM | POA: Diagnosis not present

## 2018-06-13 DIAGNOSIS — Z79899 Other long term (current) drug therapy: Secondary | ICD-10-CM | POA: Diagnosis not present

## 2018-06-13 DIAGNOSIS — R590 Localized enlarged lymph nodes: Secondary | ICD-10-CM | POA: Diagnosis not present

## 2018-06-13 DIAGNOSIS — C679 Malignant neoplasm of bladder, unspecified: Secondary | ICD-10-CM | POA: Diagnosis not present

## 2018-06-13 DIAGNOSIS — Z7984 Long term (current) use of oral hypoglycemic drugs: Secondary | ICD-10-CM | POA: Diagnosis not present

## 2018-06-13 DIAGNOSIS — E119 Type 2 diabetes mellitus without complications: Secondary | ICD-10-CM | POA: Diagnosis not present

## 2018-06-13 DIAGNOSIS — I4510 Unspecified right bundle-branch block: Secondary | ICD-10-CM

## 2018-06-13 DIAGNOSIS — R3129 Other microscopic hematuria: Secondary | ICD-10-CM | POA: Diagnosis not present

## 2018-06-13 DIAGNOSIS — N1330 Unspecified hydronephrosis: Secondary | ICD-10-CM | POA: Diagnosis not present

## 2018-06-13 DIAGNOSIS — Z882 Allergy status to sulfonamides status: Secondary | ICD-10-CM | POA: Diagnosis not present

## 2018-06-13 DIAGNOSIS — D494 Neoplasm of unspecified behavior of bladder: Secondary | ICD-10-CM | POA: Diagnosis present

## 2018-06-13 HISTORY — DX: Constipation, unspecified: K59.00

## 2018-06-13 HISTORY — DX: Hyperlipidemia, unspecified: E78.5

## 2018-06-13 HISTORY — DX: Unspecified right bundle-branch block: I45.10

## 2018-06-13 HISTORY — DX: Cellulitis, unspecified: L03.90

## 2018-06-13 HISTORY — DX: Arteritis, unspecified: I77.6

## 2018-06-13 HISTORY — DX: Hematuria, unspecified: R31.9

## 2018-06-13 HISTORY — DX: Type 2 diabetes mellitus with diabetic neuropathy, unspecified: E11.40

## 2018-06-13 HISTORY — DX: Malignant neoplasm of bladder, unspecified: C67.9

## 2018-06-13 LAB — CBC
HEMATOCRIT: 43.2 % (ref 39.0–52.0)
HEMOGLOBIN: 14.6 g/dL (ref 13.0–17.0)
MCH: 30.4 pg (ref 26.0–34.0)
MCHC: 33.8 g/dL (ref 30.0–36.0)
MCV: 89.8 fL (ref 80.0–100.0)
Platelets: 362 10*3/uL (ref 150–400)
RBC: 4.81 MIL/uL (ref 4.22–5.81)
RDW: 12.3 % (ref 11.5–15.5)
WBC: 11.4 10*3/uL — ABNORMAL HIGH (ref 4.0–10.5)
nRBC: 0 % (ref 0.0–0.2)

## 2018-06-13 LAB — HEMOGLOBIN A1C
Hgb A1c MFr Bld: 6.8 % — ABNORMAL HIGH (ref 4.8–5.6)
Mean Plasma Glucose: 148.46 mg/dL

## 2018-06-13 LAB — BASIC METABOLIC PANEL
Anion gap: 12 (ref 5–15)
BUN: 26 mg/dL — ABNORMAL HIGH (ref 8–23)
CO2: 25 mmol/L (ref 22–32)
Calcium: 8.9 mg/dL (ref 8.9–10.3)
Chloride: 102 mmol/L (ref 98–111)
Creatinine, Ser: 0.96 mg/dL (ref 0.61–1.24)
GFR calc Af Amer: >60 mL/min (ref >60)
GFR calc non Af Amer: >60 mL/min (ref >60)
Glucose, Bld: 174 mg/dL — ABNORMAL HIGH (ref 70–99)
POTASSIUM: 4.3 mmol/L (ref 3.5–5.1)
Sodium: 139 mmol/L (ref 135–145)

## 2018-06-13 LAB — PROTIME-INR
INR: 0.92
Prothrombin Time: 12.3 seconds (ref 11.4–15.2)

## 2018-06-13 LAB — ABO/RH: ABO/RH(D): O POS

## 2018-06-13 LAB — GLUCOSE, CAPILLARY: Glucose-Capillary: 167 mg/dL — ABNORMAL HIGH (ref 70–99)

## 2018-06-13 NOTE — Pre-Procedure Instructions (Signed)
CBC, BMP, Hgb A1C results 06/13/2018 sent to Dr. Gloriann Loan via epic.

## 2018-06-15 ENCOUNTER — Other Ambulatory Visit: Payer: Self-pay | Admitting: Family Medicine

## 2018-06-15 DIAGNOSIS — E119 Type 2 diabetes mellitus without complications: Secondary | ICD-10-CM

## 2018-06-15 NOTE — Anesthesia Preprocedure Evaluation (Addendum)
Anesthesia Evaluation  Patient identified by MRN, date of birth, ID band Patient awake    Reviewed: Allergy & Precautions, NPO status , Patient's Chart, lab work & pertinent test results  History of Anesthesia Complications Negative for: history of anesthetic complications  Airway Mallampati: I  TM Distance: >3 FB Neck ROM: Full    Dental  (+) Dental Advisory Given, Edentulous Upper, Edentulous Lower   Pulmonary former smoker,    Pulmonary exam normal breath sounds clear to auscultation       Cardiovascular negative cardio ROS Normal cardiovascular exam Rhythm:Regular Rate:Normal  EKG 06/13/18: incomplete RBBB   Neuro/Psych negative neurological ROS     GI/Hepatic negative GI ROS, Neg liver ROS,   Endo/Other  diabetes, Type 2, Oral Hypoglycemic Agents  Renal/GU negative Renal ROS     Musculoskeletal negative musculoskeletal ROS (+)   Abdominal   Peds  Hematology negative hematology ROS (+)   Anesthesia Other Findings Day of surgery medications reviewed with the patient.  Reproductive/Obstetrics                            Anesthesia Physical Anesthesia Plan  ASA: II  Anesthesia Plan: General   Post-op Pain Management:    Induction: Intravenous  PONV Risk Score and Plan: 2 and Treatment may vary due to age or medical condition, Ondansetron and Dexamethasone  Airway Management Planned: Oral ETT  Additional Equipment:   Intra-op Plan:   Post-operative Plan: Extubation in OR  Informed Consent: I have reviewed the patients History and Physical, chart, labs and discussed the procedure including the risks, benefits and alternatives for the proposed anesthesia with the patient or authorized representative who has indicated his/her understanding and acceptance.     Dental advisory given  Plan Discussed with: CRNA  Anesthesia Plan Comments:        Anesthesia Quick  Evaluation

## 2018-06-16 ENCOUNTER — Encounter (HOSPITAL_COMMUNITY): Payer: Self-pay | Admitting: *Deleted

## 2018-06-16 ENCOUNTER — Ambulatory Visit (HOSPITAL_COMMUNITY): Payer: Medicare Other | Admitting: Anesthesiology

## 2018-06-16 ENCOUNTER — Ambulatory Visit (HOSPITAL_COMMUNITY): Payer: Medicare Other | Admitting: Physician Assistant

## 2018-06-16 ENCOUNTER — Telehealth: Payer: Self-pay | Admitting: Oncology

## 2018-06-16 ENCOUNTER — Ambulatory Visit (HOSPITAL_COMMUNITY)
Admission: RE | Admit: 2018-06-16 | Discharge: 2018-06-16 | Disposition: A | Discharge status: Home / Self Care | Payer: Medicare Other | Attending: Urology | Admitting: Urology

## 2018-06-16 ENCOUNTER — Encounter (HOSPITAL_COMMUNITY)
Admission: RE | Disposition: A | Discharge status: Home / Self Care | Payer: Self-pay | Source: Home / Self Care | Attending: Urology

## 2018-06-16 DIAGNOSIS — C679 Malignant neoplasm of bladder, unspecified: Secondary | ICD-10-CM | POA: Diagnosis not present

## 2018-06-16 DIAGNOSIS — R590 Localized enlarged lymph nodes: Secondary | ICD-10-CM | POA: Insufficient documentation

## 2018-06-16 DIAGNOSIS — Z7984 Long term (current) use of oral hypoglycemic drugs: Secondary | ICD-10-CM | POA: Insufficient documentation

## 2018-06-16 DIAGNOSIS — Z8042 Family history of malignant neoplasm of prostate: Secondary | ICD-10-CM | POA: Insufficient documentation

## 2018-06-16 DIAGNOSIS — Z882 Allergy status to sulfonamides status: Secondary | ICD-10-CM | POA: Insufficient documentation

## 2018-06-16 DIAGNOSIS — E119 Type 2 diabetes mellitus without complications: Secondary | ICD-10-CM | POA: Insufficient documentation

## 2018-06-16 DIAGNOSIS — Z87891 Personal history of nicotine dependence: Secondary | ICD-10-CM | POA: Insufficient documentation

## 2018-06-16 DIAGNOSIS — N35911 Unspecified urethral stricture, male, meatal: Secondary | ICD-10-CM | POA: Diagnosis not present

## 2018-06-16 DIAGNOSIS — N1330 Unspecified hydronephrosis: Secondary | ICD-10-CM | POA: Insufficient documentation

## 2018-06-16 DIAGNOSIS — R3129 Other microscopic hematuria: Secondary | ICD-10-CM | POA: Diagnosis not present

## 2018-06-16 DIAGNOSIS — Z79899 Other long term (current) drug therapy: Secondary | ICD-10-CM | POA: Insufficient documentation

## 2018-06-16 HISTORY — PX: TRANSURETHRAL RESECTION OF BLADDER TUMOR: SHX2575

## 2018-06-16 LAB — TYPE AND SCREEN
ABO/RH(D): O POS
ANTIBODY SCREEN: NEGATIVE

## 2018-06-16 LAB — GLUCOSE, CAPILLARY
Glucose-Capillary: 163 mg/dL — ABNORMAL HIGH (ref 70–99)
Glucose-Capillary: 158 mg/dL — ABNORMAL HIGH (ref 70–99)

## 2018-06-16 SURGERY — TURBT (TRANSURETHRAL RESECTION OF BLADDER TUMOR)
Anesthesia: General | Laterality: Right

## 2018-06-16 MED ORDER — ROCURONIUM BROMIDE 100 MG/10ML IV SOLN
INTRAVENOUS | Status: AC
  Filled 2018-06-16: qty 1

## 2018-06-16 MED ORDER — ONDANSETRON HCL 4 MG/2ML IJ SOLN
INTRAMUSCULAR | Status: AC
  Filled 2018-06-16: qty 2

## 2018-06-16 MED ORDER — FENTANYL CITRATE (PF) 100 MCG/2ML IJ SOLN
INTRAMUSCULAR | Status: AC
  Filled 2018-06-16: qty 2

## 2018-06-16 MED ORDER — LACTATED RINGERS IV SOLN
INTRAVENOUS | Status: DC
  Administered 2018-06-16: 06:00:00 via INTRAVENOUS

## 2018-06-16 MED ORDER — SODIUM CHLORIDE 0.9 % IR SOLN
Status: DC | PRN
  Administered 2018-06-16: 15000 mL via INTRAVESICAL

## 2018-06-16 MED ORDER — LIDOCAINE 2% (20 MG/ML) 5 ML SYRINGE
INTRAMUSCULAR | Status: DC | PRN
  Administered 2018-06-16: 100 mg via INTRAVENOUS

## 2018-06-16 MED ORDER — LACTATED RINGERS IV SOLN
INTRAVENOUS | Status: DC | PRN
  Administered 2018-06-16 (×2): via INTRAVENOUS

## 2018-06-16 MED ORDER — ONDANSETRON HCL 4 MG/2ML IJ SOLN
INTRAMUSCULAR | Status: DC | PRN
  Administered 2018-06-16: 4 mg via INTRAVENOUS

## 2018-06-16 MED ORDER — LIDOCAINE 2% (20 MG/ML) 5 ML SYRINGE
INTRAMUSCULAR | Status: AC
  Filled 2018-06-16: qty 5

## 2018-06-16 MED ORDER — SUGAMMADEX SODIUM 200 MG/2ML IV SOLN
INTRAVENOUS | Status: AC
  Filled 2018-06-16: qty 2

## 2018-06-16 MED ORDER — HYDROCODONE-ACETAMINOPHEN 5-325 MG PO TABS: 1.0000 | ORAL_TABLET | ORAL | 0 refills | Status: DC | PRN

## 2018-06-16 MED ORDER — DEXAMETHASONE SODIUM PHOSPHATE 10 MG/ML IJ SOLN
INTRAMUSCULAR | Status: DC | PRN
  Administered 2018-06-16: 4 mg via INTRAVENOUS

## 2018-06-16 MED ORDER — CEFAZOLIN SODIUM-DEXTROSE 2-4 GM/100ML-% IV SOLN
2.0000 g | INTRAVENOUS | Status: AC
  Administered 2018-06-16: 2 g via INTRAVENOUS
  Filled 2018-06-16: qty 100

## 2018-06-16 MED ORDER — OXYCODONE HCL 5 MG/5ML PO SOLN
5.0000 mg | Freq: Once | ORAL | Status: DC | PRN
  Filled 2018-06-16: qty 5

## 2018-06-16 MED ORDER — ROCURONIUM BROMIDE 10 MG/ML (PF) SYRINGE
PREFILLED_SYRINGE | INTRAVENOUS | Status: DC | PRN
  Administered 2018-06-16: 45 mg via INTRAVENOUS
  Administered 2018-06-16: 5 mg via INTRAVENOUS
  Administered 2018-06-16: 10 mg via INTRAVENOUS

## 2018-06-16 MED ORDER — PROPOFOL 10 MG/ML IV BOLUS
INTRAVENOUS | Status: DC | PRN
  Administered 2018-06-16: 180 mg via INTRAVENOUS

## 2018-06-16 MED ORDER — OXYCODONE HCL 5 MG PO TABS: 5.0000 mg | ORAL_TABLET | Freq: Once | ORAL | Status: DC | PRN

## 2018-06-16 MED ORDER — DEXAMETHASONE SODIUM PHOSPHATE 10 MG/ML IJ SOLN
INTRAMUSCULAR | Status: AC
  Filled 2018-06-16: qty 1

## 2018-06-16 MED ORDER — FENTANYL CITRATE (PF) 100 MCG/2ML IJ SOLN: 25.0000 ug | INTRAMUSCULAR | Status: DC | PRN

## 2018-06-16 MED ORDER — 0.9 % SODIUM CHLORIDE (POUR BTL) OPTIME
TOPICAL | Status: DC | PRN
  Administered 2018-06-16: 1000 mL

## 2018-06-16 MED ORDER — ACETAMINOPHEN 10 MG/ML IV SOLN: 1000.0000 mg | Freq: Once | INTRAVENOUS | Status: DC | PRN

## 2018-06-16 MED ORDER — MIDAZOLAM HCL 2 MG/2ML IJ SOLN
INTRAMUSCULAR | Status: AC
  Filled 2018-06-16: qty 2

## 2018-06-16 MED ORDER — PROPOFOL 10 MG/ML IV BOLUS
INTRAVENOUS | Status: AC
  Filled 2018-06-16: qty 20

## 2018-06-16 MED ORDER — PROMETHAZINE HCL 25 MG/ML IJ SOLN: 6.2500 mg | INTRAMUSCULAR | Status: DC | PRN

## 2018-06-16 MED ORDER — FENTANYL CITRATE (PF) 100 MCG/2ML IJ SOLN
INTRAMUSCULAR | Status: DC | PRN
  Administered 2018-06-16 (×3): 50 ug via INTRAVENOUS

## 2018-06-16 MED ORDER — SUGAMMADEX SODIUM 200 MG/2ML IV SOLN
INTRAVENOUS | Status: DC | PRN
  Administered 2018-06-16: 200 mg via INTRAVENOUS

## 2018-06-16 MED ORDER — MIDAZOLAM HCL 5 MG/5ML IJ SOLN
INTRAMUSCULAR | Status: DC | PRN
  Administered 2018-06-16: 2 mg via INTRAVENOUS

## 2018-06-16 SURGICAL SUPPLY — 16 items
BAG URINE DRAINAGE (UROLOGICAL SUPPLIES) IMPLANT
BAG URO CATCHER STRL LF (MISCELLANEOUS) ×3 IMPLANT
CATH INTERMIT  6FR 70CM (CATHETERS) ×3 IMPLANT
CLOTH BEACON ORANGE TIMEOUT ST (SAFETY) ×2 IMPLANT
COVER WAND RF STERILE (DRAPES) IMPLANT
ELECT REM PT RETURN 15FT ADLT (MISCELLANEOUS) ×2 IMPLANT
GLOVE BIO SURGEON STRL SZ7.5 (GLOVE) ×3 IMPLANT
GOWN STRL REUS W/TWL XL LVL3 (GOWN DISPOSABLE) ×1 IMPLANT
GUIDEWIRE STR DUAL SENSOR (WIRE) ×3 IMPLANT
LOOP CUT BIPOLAR 24F LRG (ELECTROSURGICAL) ×1 IMPLANT
MANIFOLD NEPTUNE II (INSTRUMENTS) ×3 IMPLANT
PACK CYSTO (CUSTOM PROCEDURE TRAY) ×3 IMPLANT
SET ASPIRATION TUBING (TUBING) IMPLANT
SYRINGE IRR TOOMEY STRL 70CC (SYRINGE) ×1 IMPLANT
TUBING CONNECTING 10 (TUBING) ×3 IMPLANT
TUBING UROLOGY SET (TUBING) ×3 IMPLANT

## 2018-06-16 NOTE — Anesthesia Procedure Notes (Signed)
Procedure Name: Intubation Date/Time: 06/16/2018 7:39 AM Performed by: Lind Covert, CRNA Pre-anesthesia Checklist: Patient identified, Emergency Drugs available, Suction available, Patient being monitored and Timeout performed Patient Re-evaluated:Patient Re-evaluated prior to induction Oxygen Delivery Method: Circle system utilized Preoxygenation: Pre-oxygenation with 100% oxygen Induction Type: IV induction Ventilation: Mask ventilation without difficulty Laryngoscope Size: [name] and 4 Grade View: Grade I Tube size: 7.5 mm Number of attempts: 1 Airway Equipment and Method: Stylet Placement Confirmation: ETT inserted through vocal cords under direct vision,  positive ETCO2 and breath sounds checked- equal and bilateral Secured at: 22 cm Tube secured with: Tape Dental Injury: Teeth and Oropharynx as per pre-operative assessment

## 2018-06-16 NOTE — H&P (Signed)
CC/HPI: Cc: Gross hematuria, lower urinary tract symptoms  HPI:  06/05/2018  68 year old male went to the emergency department back in November. He had gross hematuria at that time. Urinalysis confirmed this and urine culture was negative for significant growth. Creatinine and hemoglobin at the time were normal. He does have microscopic hematuria today. He states that he passed what looked like a possible clot today. He has not had any imaging. He also has significant lower urinary tract symptoms including frequency, difficulty postponing urination, occasional burning with urination, nocturia, postvoid dribbling, intermittency, hesitancy, straining to void, weak stream. He has been having some intermittent gross hematuria since November. He has a family history of prostate cancer including prostate cancer in his 2 brothers.   06/09/2018  Patient underwent a CT IVP. Unfortunately, this revealed a 8 cm bladder tumor obstructing the right ureterovesicular junction. There was upstream hydroureteronephrosis on the right. No hydronephrosis on the left. Unfortunately, there were scattered pelvic lymph nodes greater than 1 cm. These were suggestive of metastatic disease. He continues to pass clots as well as tumor tissue.     ALLERGIES: SULFA - Hives    MEDICATIONS: Metformin Hcl  Tamsulosin Hcl 0.4 mg capsule 1 capsule PO Daily  Digestive Enzymes  Diphenhydramine Hcl 25 mg tablet  Prostate Health  Stool Softener  Taurine  Triamcinolone  Vitamin B12  Vitamin K2     GU PSH: Locm 300-399Mg /Ml Iodine,1Ml - 06/06/2018      PSH Notes: Oral Surgery   NON-GU PSH: Tonsillectomy..    GU PMH: Dysuria - 06/05/2018 Encounter for Prostate Cancer screening - 06/05/2018 Family Hx of Prostate Cancer - 06/05/2018 Gross hematuria - 06/05/2018 Nocturia - 06/05/2018 Prostate nodule w/ LUTS - 06/05/2018 Straining on Urination - 06/05/2018 Urinary Frequency - 06/05/2018 Urinary Hesitancy - 06/05/2018 Weak Urinary Stream -  06/05/2018    NON-GU PMH: Diabetes Type 2    FAMILY HISTORY: Heart Attack - Father nephrolithiasis - Brother, Father Prostate Cancer - Brother   SOCIAL HISTORY: Marital Status: Married Preferred Language: English; Race: White Current Smoking Status: Patient does not smoke anymore. Has not smoked since 05/28/1998.   Tobacco Use Assessment Completed: Used Tobacco in last 30 days? Does not drink caffeine.    REVIEW OF SYSTEMS:    GU Review Male:   Patient denies frequent urination, hard to postpone urination, burning/ pain with urination, get up at night to urinate, leakage of urine, stream starts and stops, trouble starting your stream, have to strain to urinate , erection problems, and penile pain.  Gastrointestinal (Upper):   Patient denies nausea, vomiting, and indigestion/ heartburn.  Gastrointestinal (Lower):   Patient denies diarrhea and constipation.  Constitutional:   Patient denies fever, fatigue, weight loss, and night sweats.  Skin:   Patient denies skin rash/ lesion and itching.  Eyes:   Patient denies blurred vision and double vision.  Ears/ Nose/ Throat:   Patient denies sore throat and sinus problems.  Hematologic/Lymphatic:   Patient denies swollen glands and easy bruising.  Cardiovascular:   Patient denies leg swelling and chest pains.  Respiratory:   Patient denies cough and shortness of breath.  Endocrine:   Patient denies excessive thirst.  Musculoskeletal:   Patient denies back pain and joint pain.  Neurological:   Patient denies headaches and dizziness.  Psychologic:   Patient denies depression and anxiety.   VITAL SIGNS:      06/09/2018 11:16 AM  BP 149/75 mmHg  Heart Rate 76 /min  Temperature 98.0 F /  36.6 C   MULTI-SYSTEM PHYSICAL EXAMINATION:    Constitutional: Well-nourished. No physical deformities. Normally developed. Good grooming.  Respiratory: No labored breathing, no use of accessory muscles.   Cardiovascular: Normal temperature, adequate  perfusion of extremities  Skin: No paleness, no jaundice  Neurologic / Psychiatric: Oriented to time, oriented to place, oriented to person. No depression, no anxiety, no agitation.  Gastrointestinal: No mass, no tenderness, no rigidity, non obese abdomen.  Eyes: Normal conjunctivae. Normal eyelids.  Musculoskeletal: Normal gait and station of head and neck.     PAST DATA REVIEWED:  Source Of History:  Patient  Records Review:   Previous Patient Records  X-Ray Review: C.T. Abdomen/Pelvis: Reviewed Films. Reviewed Report. Discussed With Patient.     06/05/18  PSA  Total PSA 0.40 ng/mL    PROCEDURES:          Urinalysis w/Scope Dipstick Dipstick Cont'd Micro  Color: Yellow Bilirubin: Neg mg/dL WBC/hpf: 6 - 10/hpf  Appearance: Cloudy Ketones: Neg mg/dL RBC/hpf: 40 - 60/hpf  Specific Gravity: 1.025 Blood: 3+ ery/uL Bacteria: Few (10-25/hpf)  pH: <=5.0 Protein: 1+ mg/dL Cystals: NS (Not Seen)  Glucose: Neg mg/dL Urobilinogen: 0.2 mg/dL Casts: NS (Not Seen)    Nitrites: Neg Trichomonas: Not Present    Leukocyte Esterase: Trace leu/uL Mucous: Not Present      Epithelial Cells: 0 - 5/hpf      Yeast: NS (Not Seen)      Sperm: Not Present    ASSESSMENT:      ICD-10 Details  1 GU:   Bladder tumor/neoplasm - D41.4   2   Hydronephrosis - N13.0    PLAN:           Document Letter(s):  Created for Patient: Clinical Summary         Notes:   I discussed the patient's CT scan extensively. I recommended TURBT with possible right ureteral stent placement. 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 When the pathological diagnosis and therefore proceed with biopsy versus radical TURBT for tumor debulking. I informed him that he will almost certainly need neoadjuvant chemotherapy before consideration of a cystectomy. He will need to respond to the chemotherapy before consideration of removal of the bladder. He understands this is a poor prognosis  having metastatic bladder cancer. If I'm unable to place a right ureteral stent, we will consider right nephrostomy tube placement in order to preserve renal function in preparation for cisplatin-based chemotherapy. Unfortunately, he appears to have great performance status and renal function is excellent.           Next Appointment:      Next Appointment: 06/20/2018 09:30 AM    Appointment Type: Male Cysto    Location: [facility] [phone]    Provider: Link Snuffer, III, M.D.    Reason for Visit: cysto, CT results - overbook per Meryl Hubers     Signed by Link Snuffer, III, M.D. on 06/09/18 at 11:43 AM (EST

## 2018-06-16 NOTE — Anesthesia Postprocedure Evaluation (Signed)
Anesthesia Post Note  Patient: Les Longmore Andersen-Whitehurst  Procedure(s) Performed: TRANSURETHRAL RESECTION OF BLADDER TUMOR (TURBT) (N/A )     Patient location during evaluation: PACU Anesthesia Type: General Level of consciousness: awake and alert Pain management: pain level controlled Vital Signs Assessment: post-procedure vital signs reviewed and stable Respiratory status: spontaneous breathing, nonlabored ventilation and respiratory function stable Cardiovascular status: blood pressure returned to baseline and stable Postop Assessment: no apparent nausea or vomiting Anesthetic complications: no    Last Vitals:  Vitals:   06/16/18 0915 06/16/18 0925  BP: (!) 152/77 (!) 158/76  Pulse: 71 77  Resp: 13 16  Temp: 36.9 C 36.9 C  SpO2: 98% 97%    Last Pain:  Vitals:   06/16/18 0925  TempSrc:   PainSc: 0-No pain                 Brennan Bailey

## 2018-06-16 NOTE — Discharge Instructions (Addendum)
Transurethral Resection of Bladder Tumor (TURBT) or Bladder Biopsy ° ° °Definition: ° Transurethral Resection of the Bladder Tumor is a surgical procedure used to diagnose and remove tumors within the bladder. TURBT is the most common treatment for early stage bladder cancer. ° °General instructions: °   ° Your recent bladder surgery requires very little post hospital care but some definite precautions. ° °Despite the fact that no skin incisions were used, the area around the bladder incisions are raw and covered with scabs to promote healing and prevent bleeding. Certain precautions are needed to insure that the scabs are not disturbed over the next 2-4 weeks while the healing proceeds. ° °Because the raw surface inside your bladder and the irritating effects of urine you may expect frequency of urination and/or urgency (a stronger desire to urinate) and perhaps even getting up at night more often. This will usually resolve or improve slowly over the healing period. You may see some blood in your urine over the first 6 weeks. Do not be alarmed, even if the urine was clear for a while. Get off your feet and drink lots of fluids until clearing occurs. If you start to pass clots or don't improve call us. ° °Diet: ° °You may return to your normal diet immediately. Because of the raw surface of your bladder, alcohol, spicy foods, foods high in acid and drinks with caffeine may cause irritation or frequency and should be used in moderation. To keep your urine flowing freely and avoid constipation, drink plenty of fluids during the day (8-10 glasses). Tip: Avoid cranberry juice because it is very acidic. ° °Activity: ° °Your physical activity doesn't need to be restricted. However, if you are very active, you may see some blood in the urine. We suggest that you reduce your activity under the circumstances until the bleeding has stopped. ° °Bowels: ° °It is important to keep your bowels regular during the postoperative  period. Straining with bowel movements can cause bleeding. A bowel movement every other day is reasonable. Use a mild laxative if needed, such as milk of magnesia 2-3 tablespoons, or 2 Dulcolax tablets. Call if you continue to have problems. If you had been taking narcotics for pain, before, during or after your surgery, you may be constipated. Take a laxative if necessary. ° ° ° °Medication: ° °You should resume your pre-surgery medications unless told not to. In addition you may be given an antibiotic to prevent or treat infection. Antibiotics are not always necessary. All medication should be taken as prescribed until the bottles are finished unless you are having an unusual reaction to one of the drugs. ° ° °General Anesthesia, Adult, Care After °This sheet gives you information about how to care for yourself after your procedure. Your health care provider may also give you more specific instructions. If you have problems or questions, contact your health care provider. °What can I expect after the procedure? °After the procedure, the following side effects are common: °· Pain or discomfort at the IV site. °· Nausea. °· Vomiting. °· Sore throat. °· Trouble concentrating. °· Feeling cold or chills. °· Weak or tired. °· Sleepiness and fatigue. °· Soreness and body aches. These side effects can affect parts of the body that were not involved in surgery. °Follow these instructions at home: ° °For at least 24 hours after the procedure: °· Have a responsible adult stay with you. It is important to have someone help care for you until you are awake and alert. °·   Rest as needed. °· Do not: °? Participate in activities in which you could fall or become injured. °? Drive. °? Use heavy machinery. °? Drink alcohol. °? Take sleeping pills or medicines that cause drowsiness. °? Make important decisions or sign legal documents. °? Take care of children on your own. °Eating and drinking °· Follow any instructions from your  health care provider about eating or drinking restrictions. °· When you feel hungry, start by eating small amounts of foods that are soft and easy to digest (bland), such as toast. Gradually return to your regular diet. °· Drink enough fluid to keep your urine pale yellow. °· If you vomit, rehydrate by drinking water, juice, or clear broth. °General instructions °· If you have sleep apnea, surgery and certain medicines can increase your risk for breathing problems. Follow instructions from your health care provider about wearing your sleep device: °? Anytime you are sleeping, including during daytime naps. °? While taking prescription pain medicines, sleeping medicines, or medicines that make you drowsy. °· Return to your normal activities as told by your health care provider. Ask your health care provider what activities are safe for you. °· Take over-the-counter and prescription medicines only as told by your health care provider. °· If you smoke, do not smoke without supervision. °· Keep all follow-up visits as told by your health care provider. This is important. °Contact a health care provider if: °· You have nausea or vomiting that does not get better with medicine. °· You cannot eat or drink without vomiting. °· You have pain that does not get better with medicine. °· You are unable to pass urine. °· You develop a skin rash. °· You have a fever. °· You have redness around your IV site that gets worse. °Get help right away if: °· You have difficulty breathing. °· You have chest pain. °· You have blood in your urine or stool, or you vomit blood. °Summary °· After the procedure, it is common to have a sore throat or nausea. It is also common to feel tired. °· Have a responsible adult stay with you for the first 24 hours after general anesthesia. It is important to have someone help care for you until you are awake and alert. °· When you feel hungry, start by eating small amounts of foods that are soft and easy  to digest (bland), such as toast. Gradually return to your regular diet. °· Drink enough fluid to keep your urine pale yellow. °· Return to your normal activities as told by your health care provider. Ask your health care provider what activities are safe for you. °This information is not intended to replace advice given to you by your health care provider. Make sure you discuss any questions you have with your health care provider. °Document Released: 08/20/2000 Document Revised: 12/28/2016 Document Reviewed: 12/28/2016 °Elsevier Interactive Patient Education © 2019 [employer] ° °

## 2018-06-16 NOTE — Op Note (Addendum)
Operative Note  Preoperative diagnosis:  1.  Bladder tumor--large  Postoperative diagnosis: 1.  Bladder tumor--large 2. Meatal stenosis  Procedure(s): 1.  Transurethral resection of bladder tumor--large, greater than 5 cm 2. Urethral dilation  Surgeon: Link Snuffer, MD  Assistants: None  Anesthesia: General  Complications: None immediate  EBL: 30 cc  Specimens: 1.  Bladder tumor  Drains/Catheters: 1.  None   Intraoperative findings: 1.  Normal anterior urethra.  He did have very mild meatal stenosis unable to insert a 26 French resectoscope and therefore required sequential dilation to 30 [place].  Scope then passed in easily.  2.  There appeared to be some prostatic involvement of tumor. 3.  Left ureteral orifice was normal.  I was unable to visualize the right ureteral orifice to access it for stent placement .  Large invasive tumor almost certainly muscle invasive and high-grade.  The tumor was greater than 5 cm.  It was not completely resectable.  Indication: 68 year old male evaluated for gross hematuria found to have a large bladder mass on CT scan and also pelvic lymphadenopathy consistent with metastatic disease.  He presents for the previously mentioned operation.  Description of procedure:  The patient was identified and consent was obtained.  The patient was taken to the operating room and placed in the supine position.  The patient was placed under general anesthesia.  Perioperative antibiotics were administered.  The patient was placed in dorsal lithotomy.  Patient was prepped and draped in a standard sterile fashion and a timeout was performed.  A 21 French rigid cystoscope was advanced into the urethra and into the bladder.  Complete cystoscopy was performed with the findings noted above.  I exchanged the cystoscope for a 29 French resectoscope with a visual obturator in place.  Prior to inserting the resectoscope, the meatus was a little bit tight and therefore I  sequentially dilated it from 24 [place] to 30 [place] with urethral sounds.  I then placed the 26 French resectoscope with a visual obturator in place and this was exchanged for the working element.  On bipolar settings I proceeded to resect the bladder tumor of interest.  It was not completely resectable.  I was not able to visualize the right ureteral orifices place a stent.  I collected bladder tumor for permanent specimen.  I reinspected the bladder and obtained hemostasis with electrocautery.  Once there was no significant active bleeding, I drained the bladder and reinspected the bladder once more and again there was no significant active bleeding noted and therefore the bladder was drained and scope withdrawn.  Patient tolerated procedure well and was stable postoperatively.  Plan: Return in 1 week for pathology review.  I will go ahead and start working on consultation with oncology as he will need neoadjuvant chemotherapy with positive response prior to any consideration of a cystectomy.  If he has good response, he may need a concurrent urethrectomy given that he visually appeared to have prostatic involvement.

## 2018-06-16 NOTE — Telephone Encounter (Signed)
Lft the pt's wife to schedule an appt w/Dr. Alen Blew on 1/21 at 11am. I asked that they call to confirm the appt.

## 2018-06-16 NOTE — Transfer of Care (Signed)
Immediate Anesthesia Transfer of Care Note  Patient: Sergio Pennington  Procedure(s) Performed: TRANSURETHRAL RESECTION OF BLADDER TUMOR (TURBT) (N/A )  Patient Location: PACU  Anesthesia Type:General  Level of Consciousness: sedated  Airway & Oxygen Therapy: Patient Spontanous Breathing and Patient connected to face mask oxygen  Post-op Assessment: Report given to RN and Post -op Vital signs reviewed and stable  Post vital signs: Reviewed and stable  Last Vitals:  Vitals Value Taken Time  BP 135/67 06/16/2018  8:35 AM  Temp    Pulse 78 06/16/2018  8:36 AM  Resp 15 06/16/2018  8:36 AM  SpO2 100 % 06/16/2018  8:36 AM  Vitals shown include unvalidated device data.  Last Pain:  Vitals:   06/16/18 0612  TempSrc:   PainSc: 2       Patients Stated Pain Goal: 3 (06/18/22 1146)  Complications: No apparent anesthesia complications

## 2018-06-16 NOTE — Telephone Encounter (Signed)
Pt has been scheduled to see Dr. Alen Blew on 1/21 at 11am. I spoke to the pt's wife and she's agreed to the appt date and time. Aware to arrive 30 minutes early.

## 2018-06-17 ENCOUNTER — Telehealth: Payer: Self-pay | Admitting: Oncology

## 2018-06-17 ENCOUNTER — Encounter (HOSPITAL_COMMUNITY): Payer: Self-pay | Admitting: Urology

## 2018-06-17 ENCOUNTER — Inpatient Hospital Stay: Payer: Medicare Other | Attending: Oncology | Admitting: Oncology

## 2018-06-17 DIAGNOSIS — Z5111 Encounter for antineoplastic chemotherapy: Secondary | ICD-10-CM | POA: Insufficient documentation

## 2018-06-17 DIAGNOSIS — C678 Malignant neoplasm of overlapping sites of bladder: Secondary | ICD-10-CM | POA: Diagnosis not present

## 2018-06-17 DIAGNOSIS — E1140 Type 2 diabetes mellitus with diabetic neuropathy, unspecified: Secondary | ICD-10-CM | POA: Insufficient documentation

## 2018-06-17 DIAGNOSIS — Z79899 Other long term (current) drug therapy: Secondary | ICD-10-CM

## 2018-06-17 DIAGNOSIS — Z87891 Personal history of nicotine dependence: Secondary | ICD-10-CM

## 2018-06-17 DIAGNOSIS — C679 Malignant neoplasm of bladder, unspecified: Secondary | ICD-10-CM | POA: Insufficient documentation

## 2018-06-17 DIAGNOSIS — E119 Type 2 diabetes mellitus without complications: Secondary | ICD-10-CM

## 2018-06-17 MED ORDER — PROCHLORPERAZINE MALEATE 10 MG PO TABS: 10.0000 mg | ORAL_TABLET | Freq: Four times a day (QID) | ORAL | 0 refills | Status: DC | PRN

## 2018-06-17 MED ORDER — LIDOCAINE-PRILOCAINE 2.5-2.5 % EX CREA: 1.0000 "application " | TOPICAL_CREAM | CUTANEOUS | 0 refills | Status: DC | PRN

## 2018-06-17 NOTE — Progress Notes (Signed)
Reason for the request:    Bladder cancer  HPI: I was asked by Dr. Gloriann Loan to evaluate [name] for the evaluation of bladder cancer.  He is a 68 year old man with history of diabetes that started to develop a symptoms of frequency and hematuria in November 2019.  He subsequently was evaluated by Dr. Gloriann Loan.  CT scan of the abdomen and pelvis obtained on June 06, 2018 which showed a 7.5 cm tumor in the right urinary bladder with retroperitoneal and pelvic adenopathy.  His adenopathy measuring 1.1 cm in the left periaortic area as well as 0.9 retrocaval lymph node.  A 1.4 cm right external iliac and 1.3 cm left external iliac was noted.  He underwent TURBT on June 16, 2018 and a large tumor was resected at that time.  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  The carcinoma invades into the detrusor muscle.  He tolerated the procedure well does report some discomfort and very little hematuria after that.  He is urinating freely otherwise.  He denies any constitutional symptoms of weight loss or appetite changes.  He remains active and continues to attend activities of daily living.  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  He does not report any headaches, blurry vision, syncope or seizures. Does not report any fevers, chills or sweats.  Does not report any cough, wheezing or hemoptysis.  Does not report any chest pain, palpitation, orthopnea or leg edema.  Does not report any nausea, vomiting or abdominal pain.  Does not report any constipation or diarrhea.  Does not report any skeletal complaints.    Does not report frequency, urgency or hematuria.  Does not report any skin rashes or lesions. Does not report any heat or cold intolerance.  Does not report any lymphadenopathy or petechiae.  Does not report any anxiety or depression.  Remaining review of systems is negative.    Past Medical History:   Diagnosis Date  . Bladder cancer ([place])   . Cellulitis    Left leg   . Constipation   . Diabetes mellitus without complication ([place])   . Diabetic neuropathy ([place])    Feet  . Hematuria 04/21/2018  . Hyperlipidemia   . 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 Vasculitis ([place])    Left leg  :  Past Surgical History:  Procedure Laterality Date  . MOUTH SURGERY  1980  . TONSILLECTOMY     4th grade  . TRANSURETHRAL RESECTION OF BLADDER TUMOR N/A 06/16/2018   Procedure: TRANSURETHRAL RESECTION OF BLADDER TUMOR (TURBT);  Surgeon: Lucas Mallow, MD;  Location: [facility];  Service: Urology;  Laterality: N/A;  :   Current Outpatient Medications:  .  Cholecalciferol (VITAMIN D3) 125 MCG (5000 UT) CAPS, Take 5,000 Units by mouth daily., Disp: , Rfl:  .  Cyanocobalamin (VITAMIN B-12) 5000 MCG TBDP, Take 5,000 mcg by mouth daily. , Disp: , Rfl:  .  Digestive Enzymes (DIGESTIVE ENZYME PO), Take 1 capsule by mouth 2 (two) times daily. , Disp: , Rfl:  .  diphenhydrAMINE (BENADRYL) 25 MG tablet, Take 25 mg by mouth at bedtime., Disp: , Rfl:  .  Emollient (CERAVE) CREA, Apply 1 application topically 2 (two) times daily., Disp: , Rfl:  .  HYDROcodone-acetaminophen (NORCO/VICODIN) 5-325 MG tablet, Take 1 tablet by mouth every 4 (four) hours as needed for moderate pain., Disp: 12 tablet, Rfl:  0 .  lidocaine-prilocaine (EMLA) cream, Apply 1 application topically as needed., Disp: 30 g, Rfl: 0 .  Menaquinone-7 (VITAMIN K2 PO), Take 50 mcg by mouth daily. , Disp: , Rfl:  .  metFORMIN (GLUCOPHAGE) 500 MG tablet, Take 1 tablet (500 mg total) by mouth 2 (two) times daily with a meal., Disp: 30 tablet, Rfl: 0 .  OVER THE COUNTER MEDICATION, Take 240 mLs by mouth daily. [place]a Liquid Supplement, Disp: , Rfl:  .  prochlorperazine (COMPAZINE) 10 MG tablet, Take 1 tablet (10 mg total) by mouth every 6 (six) hours as needed for nausea or vomiting., Disp: 30 tablet, Rfl: 0 .   Sennosides-Docusate Sodium (STOOL SOFTENER & LAXATIVE PO), Take by mouth 2 (two) times daily., Disp: , Rfl:  .  tamsulosin (FLOMAX) 0.4 MG CAPS capsule, Take 0.4 mg by mouth daily., Disp: , Rfl:  .  tolnaftate (TINACTIN) 1 % spray, Apply 1 spray topically daily as needed (for feet itching)., Disp: , Rfl:  .  triamcinolone cream (KENALOG) 0.1 %, APPLY TO AFFECTED AREA TWICE A DAY (Patient taking differently: Apply 1 application topically 2 (two) times daily as needed (for itching). ), Disp: 80 g, Rfl: 1:  Allergies  Allergen Reactions  . Sulfa Antibiotics Hives  :  No family history on file.:  Social History   Socioeconomic History  . Marital status: Married    Spouse name: Not on file  . Number of children: Not on file  . Years of education: Not on file  . Highest education level: Not on file  Occupational History  . Not on file  Social Needs  . Financial resource strain: Not on file  . Food insecurity:    Worry: Not on file    Inability: Not on file  . Transportation needs:    Medical: Not on file    Non-medical: Not on file  Tobacco Use  . Smoking status: Former Research scientist (life sciences)  . Smokeless tobacco: Never Used  Substance and Sexual Activity  . Alcohol use: Not Currently    Alcohol/week: 1.0 standard drinks    Types: 1 Glasses of wine per week  . Drug use: Not Currently  . Sexual activity: Not on file  Lifestyle  . Physical activity:    Days per week: Not on file    Minutes per session: Not on file  . Stress: Not on file  Relationships  . Social connections:    Talks on phone: Not on file    Gets together: Not on file    Attends religious service: Not on file    Active member of club or organization: Not on file    Attends meetings of clubs or organizations: Not on file    Relationship status: Not on file  . Intimate partner violence:    Fear of current or ex partner: Not on file    Emotionally abused: Not on file    Physically abused: Not on file    Forced sexual  activity: Not on file  Other Topics Concern  . Not on file  Social History Narrative  . Not on file  :  Pertinent items are noted in HPI.  Exam: Blood pressure 123/72, pulse 80, temperature 97.8 F (36.6 C), temperature source Oral, resp. rate 18, height 6' (1.829 m), weight 182 lb 3.2 oz (82.6 kg), SpO2 99 %.  ECOG 0 General appearance: alert and cooperative appeared without distress. Head: atraumatic without any abnormalities. Eyes: conjunctivae/corneas clear. PERRL.  Sclera anicteric. Throat: lips, mucosa,  and tongue normal; without oral thrush or ulcers. Resp: clear to auscultation bilaterally without rhonchi, wheezes or dullness to percussion. Cardio: regular rate and rhythm, S1, S2 normal, no murmur, click, rub or gallop GI: soft, non-tender; bowel sounds normal; no masses,  no organomegaly Skin: Skin color, texture, turgor normal. No rashes or lesions Lymph nodes: Cervical, supraclavicular, and axillary nodes normal. Neurologic: Grossly normal without any motor, sensory or deep tendon reflexes. Musculoskeletal: No joint deformity or effusion.  CBC    Component Value Date/Time   WBC 11.4 (H) 06/13/2018 0858   RBC 4.81 06/13/2018 0858   HGB 14.6 06/13/2018 0858   HCT 43.2 06/13/2018 0858   PLT 362 06/13/2018 0858   MCV 89.8 06/13/2018 0858   MCV 91.5 01/17/2015 1544   MCH 30.4 06/13/2018 0858   MCHC 33.8 06/13/2018 0858   RDW 12.3 06/13/2018 0858   LYMPHSABS 1.8 04/21/2018 2035   MONOABS 1.1 (H) 04/21/2018 2035   EOSABS 0.5 04/21/2018 2035   BASOSABS 0.1 04/21/2018 2035     Chemistry      Component Value Date/Time   NA 139 06/13/2018 0858   K 4.3 06/13/2018 0858   CL 102 06/13/2018 0858   CO2 25 06/13/2018 0858   BUN 26 (H) 06/13/2018 0858   CREATININE 0.96 06/13/2018 0858   CREATININE 1.05 01/17/2015 1559      Component Value Date/Time   CALCIUM 8.9 06/13/2018 0858   ALKPHOS 63 06/27/2017 0958   AST 16 06/27/2017 0958   ALT 19 06/27/2017 0958   BILITOT  0.7 06/27/2017 0958       Assessment and Plan:    68 year old man with the following:  1.  Bladder cancer diagnosed in January 2020 after presenting with a large bladder tumor and pelvic adenopathy.  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  He does have pelvic and retroperitoneal adenopathy indicating advanced disease.  The natural course of this disease and treatment options were discussed at this time.  His presentation indicating advanced disease however his performance status and kidney function remains excellent and aggressive therapy is warranted.  Systemic chemotherapy would be the first-line to treat his disease and if he has an excellent response with no disease outside of the bladder, then radical cystectomy could be considered.  He also understands that his disease might progress or not completely response to chemotherapy and cystectomy might not be option at that time.  The logistics and rationale for using chemotherapy was reviewed today in detail.  Complication associated with cisplatin and gemcitabine chemotherapy was discussed.  These complications include nausea, vomiting, myelosuppression, fatigue, infusion related complications, renal insufficiency, neutropenia, neutropenic sepsis and rarely serious thrombosis, hospitalization and death.  The benefit would also if he has an excellent response to chemotherapy, curative surgical resection may be attempted.  The plan is to treat with gemcitabine and cisplatin on day 1, gemcitabine day 8 out of a 21-day cycle.  Anticipate needing 4-6 cycles of therapy to achieve a complete response before consideration for surgery.  After discussion today, he is agreeable to proceed after chemo education class.   2.  IV access: Risks and benefits of Port-A-Cath insertion was reviewed today.  Complications include thrombosis, bleeding  and infection.  He is agreeable to proceed prior to proceeding with the start of chemotherapy.  3.  Antiemetics: Prescription for Compazine was made available to him.  4.  Renal function surveillance: His  knee function at baseline is normal.  We will continue to monitor on cisplatin therapy.  5.  Goals of care: He has advanced disease but could potentially be curable.  His performance status is excellent and aggressive therapy is warranted.  6.  Follow-up: We will be in the immediate future to start chemotherapy.   Thank you for the referral. A copy of this consult has been forwarded to the requesting physician.

## 2018-06-17 NOTE — Telephone Encounter (Signed)
Scheduled appts per 01/21 los.  Printed calendar and avs.  Added first treatment to the book for approval.

## 2018-06-17 NOTE — Progress Notes (Signed)
START ON PATHWAY REGIMEN - Bladder     A cycle is every 21 days:     Gemcitabine      Cisplatin   **Always confirm dose/schedule in your pharmacy ordering system**  Patient Characteristics: Metastatic Disease, First Line, No Prior Neoadjuvant/Adjuvant Therapy, Good Renal Function (CrCl ? 50 mL/min) AJCC M Category: M1 AJCC N Category: NX AJCC T Category: TX Current evidence of distant metastases<= Yes AJCC 8 Stage Grouping: IVB Line of Therapy: First Line Prior Neoadjuvant/Adjuvant Therapy<= No Renal Function: Good Renal Function (CrCl ? 50 mL/min) Intent of Therapy: Curative Intent, Discussed with Patient

## 2018-06-18 ENCOUNTER — Telehealth: Payer: Self-pay

## 2018-06-18 NOTE — Telephone Encounter (Signed)
Received call from patient spouse following up with questions regarding scheduling. Communicated appointment dates and times and answered questions and concerns. Sergio Pennington verbalized understanding of appointment information provided and had no other questions or concerns.

## 2018-06-18 NOTE — Telephone Encounter (Signed)
Spoke with [employer] in IR and she confirmed that they can draw the labs in IR on 1/30 after port placement but she stated that they cannot leave the port accessed overnight. Communicated labs that need to be drawn are CBC and CMP, confirmed by Tiffany.

## 2018-06-19 ENCOUNTER — Telehealth: Payer: Self-pay | Admitting: Oncology

## 2018-06-19 NOTE — Telephone Encounter (Signed)
Called patient to let the patient know that there treatment was added.  Patient is aware of his appt.

## 2018-06-19 NOTE — Telephone Encounter (Signed)
per edu nurse moved from 01/24 to 01/27. Spoke with wife

## 2018-06-20 ENCOUNTER — Inpatient Hospital Stay: Payer: Medicare Other

## 2018-06-23 ENCOUNTER — Telehealth: Payer: Self-pay | Admitting: Oncology

## 2018-06-23 ENCOUNTER — Encounter: Payer: Self-pay | Admitting: Oncology

## 2018-06-23 ENCOUNTER — Inpatient Hospital Stay: Payer: Medicare Other

## 2018-06-23 NOTE — Telephone Encounter (Signed)
Patient spouse came and got a print out of the updated appt calendar.

## 2018-06-23 NOTE — Progress Notes (Signed)
Met with patient and spouse to introduce myself as Arboriculturist and to offer available resources.  Discussed one-time $32 Engineer, drilling to assist with personal expenses while going through treatment. Patient states they are ove the income but ok financially right now.  Gave my card for any additional financial questions or concerns.

## 2018-06-25 ENCOUNTER — Other Ambulatory Visit: Payer: Self-pay | Admitting: Physician Assistant

## 2018-06-26 ENCOUNTER — Ambulatory Visit (HOSPITAL_COMMUNITY)
Admission: RE | Admit: 2018-06-26 | Discharge: 2018-06-26 | Disposition: A | Discharge status: Home / Self Care | Payer: Medicare Other | Source: Ambulatory Visit | Attending: Oncology | Admitting: Oncology

## 2018-06-26 ENCOUNTER — Other Ambulatory Visit: Payer: Medicare Other

## 2018-06-26 ENCOUNTER — Other Ambulatory Visit: Payer: Self-pay

## 2018-06-26 ENCOUNTER — Encounter (HOSPITAL_COMMUNITY): Payer: Self-pay

## 2018-06-26 ENCOUNTER — Other Ambulatory Visit: Payer: Self-pay | Admitting: Oncology

## 2018-06-26 DIAGNOSIS — C678 Malignant neoplasm of overlapping sites of bladder: Secondary | ICD-10-CM

## 2018-06-26 DIAGNOSIS — Z7984 Long term (current) use of oral hypoglycemic drugs: Secondary | ICD-10-CM | POA: Diagnosis not present

## 2018-06-26 DIAGNOSIS — E1140 Type 2 diabetes mellitus with diabetic neuropathy, unspecified: Secondary | ICD-10-CM | POA: Diagnosis not present

## 2018-06-26 DIAGNOSIS — E785 Hyperlipidemia, unspecified: Secondary | ICD-10-CM | POA: Insufficient documentation

## 2018-06-26 DIAGNOSIS — Z87891 Personal history of nicotine dependence: Secondary | ICD-10-CM | POA: Insufficient documentation

## 2018-06-26 DIAGNOSIS — Z79899 Other long term (current) drug therapy: Secondary | ICD-10-CM | POA: Insufficient documentation

## 2018-06-26 HISTORY — PX: IR IMAGING GUIDED PORT INSERTION: IMG5740

## 2018-06-26 LAB — COMPREHENSIVE METABOLIC PANEL WITH GFR
ALT: 20 U/L (ref 0–44)
AST: 21 U/L (ref 15–41)
Albumin: 4 g/dL (ref 3.5–5.0)
Alkaline Phosphatase: 58 U/L (ref 38–126)
Anion gap: 10 (ref 5–15)
BUN: 25 mg/dL — ABNORMAL HIGH (ref 8–23)
CO2: 26 mmol/L (ref 22–32)
Calcium: 8.9 mg/dL (ref 8.9–10.3)
Chloride: 102 mmol/L (ref 98–111)
Creatinine, Ser: 0.82 mg/dL (ref 0.61–1.24)
GFR calc Af Amer: >60 mL/min
GFR calc non Af Amer: >60 mL/min
Glucose, Bld: 148 mg/dL — ABNORMAL HIGH (ref 70–99)
Potassium: 3.8 mmol/L (ref 3.5–5.1)
Sodium: 138 mmol/L (ref 135–145)
Total Bilirubin: 0.3 mg/dL (ref 0.3–1.2)
Total Protein: 7.1 g/dL (ref 6.5–8.1)

## 2018-06-26 LAB — CBC
HCT: 41.8 % (ref 39.0–52.0)
Hemoglobin: 13.6 g/dL (ref 13.0–17.0)
MCH: 30.6 pg (ref 26.0–34.0)
MCHC: 32.5 g/dL (ref 30.0–36.0)
MCV: 94.1 fL (ref 80.0–100.0)
Platelets: 361 10*3/uL (ref 150–400)
RBC: 4.44 MIL/uL (ref 4.22–5.81)
RDW: 12.9 % (ref 11.5–15.5)
WBC: 12.4 10*3/uL — ABNORMAL HIGH (ref 4.0–10.5)
nRBC: 0 % (ref 0.0–0.2)

## 2018-06-26 LAB — PROTIME-INR
INR: 1.02
Prothrombin Time: 13.3 s (ref 11.4–15.2)

## 2018-06-26 LAB — GLUCOSE, CAPILLARY: Glucose-Capillary: 127 mg/dL — ABNORMAL HIGH (ref 70–99)

## 2018-06-26 LAB — MAGNESIUM: MAGNESIUM: 2.1 mg/dL (ref 1.7–2.4)

## 2018-06-26 LAB — APTT: aPTT: 32 seconds (ref 24–36)

## 2018-06-26 MED ORDER — CEFAZOLIN SODIUM-DEXTROSE 2-4 GM/100ML-% IV SOLN
2.0000 g | Freq: Once | INTRAVENOUS | Status: AC
  Administered 2018-06-26: 2 g via INTRAVENOUS

## 2018-06-26 MED ORDER — MIDAZOLAM HCL 2 MG/2ML IJ SOLN
INTRAMUSCULAR | Status: AC | PRN
  Administered 2018-06-26 (×3): 1 mg via INTRAVENOUS

## 2018-06-26 MED ORDER — FENTANYL CITRATE (PF) 100 MCG/2ML IJ SOLN
INTRAMUSCULAR | Status: AC
  Filled 2018-06-26: qty 2

## 2018-06-26 MED ORDER — MIDAZOLAM HCL 2 MG/2ML IJ SOLN
INTRAMUSCULAR | Status: AC
  Filled 2018-06-26: qty 4

## 2018-06-26 MED ORDER — HEPARIN SOD (PORK) LOCK FLUSH 100 UNIT/ML IV SOLN
INTRAVENOUS | Status: AC | PRN
  Administered 2018-06-26: 500 [IU]

## 2018-06-26 MED ORDER — CEFAZOLIN SODIUM-DEXTROSE 2-4 GM/100ML-% IV SOLN
INTRAVENOUS | Status: AC
  Administered 2018-06-26: 2 g via INTRAVENOUS
  Filled 2018-06-26: qty 100

## 2018-06-26 MED ORDER — SODIUM CHLORIDE 0.9 % IV SOLN
INTRAVENOUS | Status: DC
  Administered 2018-06-26: 13:00:00 via INTRAVENOUS

## 2018-06-26 MED ORDER — HEPARIN SOD (PORK) LOCK FLUSH 100 UNIT/ML IV SOLN
INTRAVENOUS | Status: AC
  Filled 2018-06-26: qty 5

## 2018-06-26 MED ORDER — LIDOCAINE HCL 1 % IJ SOLN
INTRAMUSCULAR | Status: AC
  Filled 2018-06-26: qty 20

## 2018-06-26 MED ORDER — FENTANYL CITRATE (PF) 100 MCG/2ML IJ SOLN
INTRAMUSCULAR | Status: AC | PRN
  Administered 2018-06-26 (×2): 50 ug via INTRAVENOUS

## 2018-06-26 MED ORDER — LIDOCAINE HCL (PF) 1 % IJ SOLN
INTRAMUSCULAR | Status: AC | PRN
  Administered 2018-06-26 (×2): 10 mL

## 2018-06-26 NOTE — Procedures (Signed)
RIJV PAC SVC RA EBL 0 Comp 0 

## 2018-06-26 NOTE — H&P (Signed)
Chief Complaint: Patient was seen in consultation today for bladder cancer.  Referring Physician(s): Wyatt Portela  Supervising Physician: Marybelle Killings  Patient Status: Sergio Pennington - Out-pt  History of Present Illness: Sergio Pennington is a 68 y.o. male with a past medical history of vasculitis, RBBB, hyperlipidemia, bladder cancer, diabetes mellitus with associated diabetic neuropathy, and cellulitis. He was unfortunately diagnosed with bladder cancer in 05/2018. His cancer is managed by Dr. Alen Blew. He has tentative plans to begin chemotherapy.  IR requested by Dr. Alen Blew for possible image-guided Port-a-cath insertion. Patient awake and alert sitting in bed. Accompanied by wife at bedside. Complains of lower abdominal/pelvic pain, rated 2/10 at this time. States this is normal for him due to his bladder cancer. Denies fever, chills, chest pain, dyspnea, or headache.   Past Medical History:  Diagnosis Date  . Bladder cancer ([place])   . Cellulitis    Left leg   . Constipation   . Diabetes mellitus without complication ([name])   . Diabetic neuropathy ([place])    Feet  . Hematuria 04/21/2018  . Hyperlipidemia   . 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 Vasculitis ([place])    Left leg    Past Surgical History:  Procedure Laterality Date  . MOUTH SURGERY  1980  . TONSILLECTOMY     4th grade  . TRANSURETHRAL RESECTION OF BLADDER TUMOR N/A 06/16/2018   Procedure: TRANSURETHRAL RESECTION OF BLADDER TUMOR (TURBT);  Surgeon: Lucas Mallow, MD;  Location: [facility];  Service: Urology;  Laterality: N/A;    Allergies: Sulfa antibiotics  Medications: Prior to Admission medications   Medication Sig Start Date End Date Taking? Authorizing Provider  Cholecalciferol (VITAMIN D3) 125 MCG (5000 UT) CAPS Take 5,000 Units by mouth daily.    [provider]  Cyanocobalamin (VITAMIN B-12) 5000 MCG TBDP Take 5,000 mcg by mouth daily.      [provider]  Digestive Enzymes (DIGESTIVE ENZYME PO) Take 1 capsule by mouth 2 (two) times daily.     [provider]  diphenhydrAMINE (BENADRYL) 25 MG tablet Take 25 mg by mouth at bedtime.    [provider]  Emollient (CERAVE) CREA Apply 1 application topically 2 (two) times daily.    [provider]  HYDROcodone-acetaminophen (NORCO/VICODIN) 5-325 MG tablet Take 1 tablet by mouth every 4 (four) hours as needed for moderate pain. 06/16/18 06/16/19  Lucas Mallow, MD  lidocaine-prilocaine (EMLA) cream Apply 1 application topically as needed. 06/17/18   Wyatt Portela, MD  Menaquinone-7 (VITAMIN K2 PO) Take 50 mcg by mouth daily.     [provider]  metFORMIN (GLUCOPHAGE) 500 MG tablet Take 1 tablet (500 mg total) by mouth 2 (two) times daily with a meal. 06/17/18   [name], [name], MD  OVER THE COUNTER MEDICATION Take 240 mLs by mouth daily. Asca Liquid Supplement    [provider]  prochlorperazine (COMPAZINE) 10 MG tablet Take 1 tablet (10 mg total) by mouth every 6 (six) hours as needed for nausea or vomiting. 06/17/18   Wyatt Portela, MD  Sennosides-Docusate Sodium (STOOL SOFTENER & LAXATIVE PO) Take by mouth 2 (two) times daily.    [provider]  tamsulosin (FLOMAX) 0.4 MG CAPS capsule Take 0.4 mg by mouth daily. 06/13/18   [provider]  tolnaftate (TINACTIN) 1 % spray Apply 1 spray topically daily as needed (for feet itching).    [provider]  triamcinolone cream (KENALOG) 0.1 % APPLY TO AFFECTED AREA TWICE A DAY Patient taking differently: Apply 1 application topically 2 (two) times daily as needed (for itching).  04/10/18   [name], [name], MD     History reviewed. No pertinent family history.  Social History   Socioeconomic History  . Marital status: Married    Spouse name: Not on file  . Number of children: Not on file  . Years of education: Not on file  . Highest education  level: Not on file  Occupational History  . Not on file  Social Needs  . Financial resource strain: Not on file  . Food insecurity:    Worry: Not on file    Inability: Not on file  . Transportation needs:    Medical: Not on file    Non-medical: Not on file  Tobacco Use  . Smoking status: Former Research scientist (life sciences)  . Smokeless tobacco: Never Used  Substance and Sexual Activity  . Alcohol use: Not Currently    Alcohol/week: 1.0 standard drinks    Types: 1 Glasses of wine per week  . Drug use: Not Currently  . Sexual activity: Not on file  Lifestyle  . Physical activity:    Days per week: Not on file    Minutes per session: Not on file  . Stress: Not on file  Relationships  . Social connections:    Talks on phone: Not on file    Gets together: Not on file    Attends religious service: Not on file    Active member of club or organization: Not on file    Attends meetings of clubs or organizations: Not on file    Relationship status: Not on file  Other Topics Concern  . Not on file  Social History Narrative  . Not on file     Review of Systems: A 12 point ROS discussed and pertinent positives are indicated in the HPI above.  All other systems are negative.  Review of Systems  Constitutional: Negative for chills and fever.  Respiratory: Negative for shortness of breath and wheezing.   Cardiovascular: Negative for chest pain and palpitations.  Gastrointestinal: Positive for abdominal pain.  Neurological: Negative for headaches.  Psychiatric/Behavioral: Negative for behavioral problems and confusion.    Vital Signs: BP (!) 155/84 (BP Location: Left Arm)   Pulse 80   Temp 97.8 F (36.6 C) (Oral)   Resp 18   Ht 6' (1.829 m)   Wt 182 lb 3.2 oz (82.6 kg)   SpO2 98%   BMI 24.71 kg/m   Physical Exam Vitals signs and nursing note reviewed.  Constitutional:      General: He is not in acute distress.    Appearance: Normal appearance.  Cardiovascular:     Rate and Rhythm: Normal  rate and regular rhythm.     Heart sounds: Normal heart sounds. No murmur.  Pulmonary:     Effort: Pulmonary effort is normal. No respiratory distress.     Breath sounds: Normal breath sounds. No wheezing.  Abdominal:     Palpations: Abdomen is soft.     Tenderness: There is no abdominal tenderness. There is guarding.  Skin:    General: Skin is warm and dry.  Neurological:     Mental Status: He is alert and oriented to person, place, and time.  Psychiatric:        Mood and Affect: Mood normal.        Behavior: Behavior normal.  Thought Content: Thought content normal.        Judgment: Judgment normal.      MD Evaluation Airway: WNL Heart: WNL Abdomen: WNL Chest/ Lungs: WNL ASA  Classification: 3 Mallampati/Airway Score: One   Imaging: No results found.  Labs:  CBC: Recent Labs    06/27/17 0958 04/21/18 2035 06/13/18 0858 06/26/18 1208  WBC 7.8 11.4* 11.4* 12.4*  HGB 16.0 14.8 14.6 13.6  HCT 46.5 44.0 43.2 41.8  PLT 251.0 301 362 361    COAGS: Recent Labs    06/13/18 0858 06/26/18 1208  INR 0.92 1.02  APTT  --  32    BMP: Recent Labs    06/27/17 0958 04/21/18 2035 06/13/18 0858  NA 139 141 139  K 4.8 4.2 4.3  CL 102 105 102  CO2 31 28 25   GLUCOSE 170* 152* 174*  BUN 21 24* 26*  CALCIUM 9.2 9.3 8.9  CREATININE 0.91 0.94 0.96  GFRNONAA  --  >60 >60  GFRAA  --  >60 >60    LIVER FUNCTION TESTS: Recent Labs    06/27/17 0958  BILITOT 0.7  AST 16  ALT 19  ALKPHOS 63  PROT 6.9  ALBUMIN 4.4    Assessment and Plan:  Bladder cancer. Plan for image-guided Port-a-cath insertion today with Dr. Barbie Banner. Patient is NPO. Afebrile. He does not take blood thinners. INR 1.02 seconds today.  Risks and benefits of image guided port-a-catheter placement was discussed with the patient including, but not limited to bleeding, infection, pneumothorax, or fibrin sheath development and need for additional procedures. All of the patient's questions  were answered, patient is agreeable to proceed. Consent signed and in chart.   Thank you for this interesting consult.  I greatly enjoyed meeting LUIE LANEVE and look forward to participating in their care.  A copy of this report was sent to the requesting provider on this date.  Electronically Signed: Earley Abide, PA-C 06/26/2018, 12:44 PM   I spent a total of 40 Minutes in face to face in clinical consultation, greater than 50% of which was counseling/coordinating care for bladder cancer.

## 2018-06-26 NOTE — Discharge Instructions (Signed)
Moderate Conscious Sedation, Adult, Care After  These instructions provide you with information about caring for yourself after your procedure. Your health care provider may also give you more specific instructions. Your treatment has been planned according to current medical practices, but problems sometimes occur. Call your health care provider if you have any problems or questions after your procedure.  What can I expect after the procedure?  After your procedure, it is common:  · To feel sleepy for several hours.  · To feel clumsy and have poor balance for several hours.  · To have poor judgment for several hours.  · To vomit if you eat too soon.  Follow these instructions at home:  For at least 24 hours after the procedure:    · Do not:  ? Participate in activities where you could fall or become injured.  ? Drive.  ? Use heavy machinery.  ? Drink alcohol.  ? Take sleeping pills or medicines that cause drowsiness.  ? Make important decisions or sign legal documents.  ? Take care of children on your own.  · Rest.  Eating and drinking  · Follow the diet recommended by your health care provider.  · If you vomit:  ? Drink water, juice, or soup when you can drink without vomiting.  ? Make sure you have little or no nausea before eating solid foods.  General instructions  · Have a responsible adult stay with you until you are awake and alert.  · Take over-the-counter and prescription medicines only as told by your health care provider.  · If you smoke, do not smoke without supervision.  · Keep all follow-up visits as told by your health care provider. This is important.  Contact a health care provider if:  · You keep feeling nauseous or you keep vomiting.  · You feel light-headed.  · You develop a rash.  · You have a fever.  Get help right away if:  · You have trouble breathing.  This information is not intended to replace advice given to you by your health care provider. Make sure you discuss any questions you have  with your health care provider.  Document Released: 03/04/2013 Document Revised: 10/17/2015 Document Reviewed: 09/03/2015  Elsevier Interactive Patient Education © 2019 [employer]  Implanted Port Insertion, Care After  This sheet gives you information about how to care for yourself after your procedure. Your health care provider may also give you more specific instructions. If you have problems or questions, contact your health care provider.  What can I expect after the procedure?  After the procedure, it is common to have:  · Discomfort at the port insertion site.  · Bruising on the skin over the port. This should improve over 3-4 days.  Follow these instructions at home:  Port care  · After your port is placed, you will get a manufacturer's information card. The card has information about your port. Keep this card with you at all times.  · Take care of the port as told by your health care provider. Ask your health care provider if you or a family member can get training for taking care of the port at home. A home health care nurse may also take care of the port.  · Make sure to remember what type of port you have.  Incision care         · Follow instructions from your health care provider about how to take care of your port insertion   site. Make sure you:  ? Wash your hands with soap and water before and after you change your bandage (dressing). If soap and water are not available, use hand sanitizer.  ? Change your dressing as told by your health care provider.  ? Leave stitches (sutures), skin glue, or adhesive strips in place. These skin closures may need to stay in place for 2 weeks or longer. If adhesive strip edges start to loosen and curl up, you may trim the loose edges. Do not remove adhesive strips completely unless your health care provider tells you to do that.  · Check your port insertion site every day for signs of infection. Check for:  ? Redness, swelling, or pain.  ? Fluid or  blood.  ? Warmth.  ? Pus or a bad smell.  Activity  · Return to your normal activities as told by your health care provider. Ask your health care provider what activities are safe for you.  · Do not lift anything that is heavier than 10 lb (4.5 kg), or the limit that you are told, until your health care provider says that it is safe.  General instructions  · Take over-the-counter and prescription medicines only as told by your health care provider.  · Do not take baths, swim, or use a hot tub until your health care provider approves. Ask your health care provider if you may take showers. You may only be allowed to take sponge baths.  · Do not drive for 24 hours if you were given a sedative during your procedure.  · Wear a medical alert bracelet in case of an emergency. This will tell any health care providers that you have a port.  · Keep all follow-up visits as told by your health care provider. This is important.  Contact a health care provider if:  · You cannot flush your port with saline as directed, or you cannot draw blood from the port.  · You have a fever or chills.  · You have redness, swelling, or pain around your port insertion site.  · You have fluid or blood coming from your port insertion site.  · Your port insertion site feels warm to the touch.  · You have pus or a bad smell coming from the port insertion site.  Get help right away if:  · You have chest pain or shortness of breath.  · You have bleeding from your port that you cannot control.  Summary  · Take care of the port as told by your health care provider. Keep the manufacturer's information card with you at all times.  · Change your dressing as told by your health care provider.  · Contact a health care provider if you have a fever or chills or if you have redness, swelling, or pain around your port insertion site.  · Keep all follow-up visits as told by your health care provider.  This information is not intended to replace advice given to  you by your health care provider. Make sure you discuss any questions you have with your health care provider.  Document Released: 03/04/2013 Document Revised: 12/10/2017 Document Reviewed: 12/10/2017  Elsevier Interactive Patient Education © 2019 [employer]

## 2018-06-27 ENCOUNTER — Inpatient Hospital Stay: Payer: Medicare Other

## 2018-06-27 ENCOUNTER — Other Ambulatory Visit: Payer: Medicare Other

## 2018-06-27 ENCOUNTER — Ambulatory Visit (HOSPITAL_BASED_OUTPATIENT_CLINIC_OR_DEPARTMENT_OTHER): Payer: Medicare Other | Admitting: Medical

## 2018-06-27 VITALS — BP 131/66 | HR 68 | Temp 98.2°F | Resp 18

## 2018-06-27 DIAGNOSIS — C679 Malignant neoplasm of bladder, unspecified: Secondary | ICD-10-CM | POA: Diagnosis present

## 2018-06-27 DIAGNOSIS — T8090XA Unspecified complication following infusion and therapeutic injection, initial encounter: Secondary | ICD-10-CM

## 2018-06-27 DIAGNOSIS — C678 Malignant neoplasm of overlapping sites of bladder: Secondary | ICD-10-CM

## 2018-06-27 DIAGNOSIS — Z5111 Encounter for antineoplastic chemotherapy: Secondary | ICD-10-CM | POA: Diagnosis present

## 2018-06-27 DIAGNOSIS — E1140 Type 2 diabetes mellitus with diabetic neuropathy, unspecified: Secondary | ICD-10-CM | POA: Diagnosis not present

## 2018-06-27 MED ORDER — SODIUM CHLORIDE 0.9 % IV SOLN
Freq: Once | INTRAVENOUS | Status: AC
  Administered 2018-06-27: 09:00:00 via INTRAVENOUS
  Filled 2018-06-27: qty 250

## 2018-06-27 MED ORDER — SODIUM CHLORIDE 0.9 % IV SOLN
Freq: Once | INTRAVENOUS | Status: DC
  Filled 2018-06-27: qty 250

## 2018-06-27 MED ORDER — FAMOTIDINE IN NACL 20-0.9 MG/50ML-% IV SOLN
20.0000 mg | Freq: Once | INTRAVENOUS | Status: AC
  Administered 2018-06-27: 20 mg via INTRAVENOUS

## 2018-06-27 MED ORDER — SODIUM CHLORIDE 0.9 % IV SOLN
Freq: Once | INTRAVENOUS | Status: AC
  Administered 2018-06-27: 11:00:00 via INTRAVENOUS
  Filled 2018-06-27: qty 5

## 2018-06-27 MED ORDER — SODIUM CHLORIDE 0.9 % IV SOLN
2000.0000 mg | Freq: Once | INTRAVENOUS | Status: AC
  Administered 2018-06-27: 2000 mg via INTRAVENOUS
  Filled 2018-06-27: qty 52.6

## 2018-06-27 MED ORDER — PALONOSETRON HCL INJECTION 0.25 MG/5ML
INTRAVENOUS | Status: AC
  Filled 2018-06-27: qty 5

## 2018-06-27 MED ORDER — PALONOSETRON HCL INJECTION 0.25 MG/5ML
0.2500 mg | Freq: Once | INTRAVENOUS | Status: AC
  Administered 2018-06-27: 0.25 mg via INTRAVENOUS

## 2018-06-27 MED ORDER — SODIUM CHLORIDE 0.9 % IV SOLN
70.0000 mg/m2 | Freq: Once | INTRAVENOUS | Status: AC
  Administered 2018-06-27: 144 mg via INTRAVENOUS
  Filled 2018-06-27: qty 144

## 2018-06-27 MED ORDER — HEPARIN SOD (PORK) LOCK FLUSH 100 UNIT/ML IV SOLN
500.0000 [IU] | Freq: Once | INTRAVENOUS | Status: AC | PRN
  Administered 2018-06-27: 500 [IU]
  Filled 2018-06-27: qty 5

## 2018-06-27 MED ORDER — POTASSIUM CHLORIDE 2 MEQ/ML IV SOLN
Freq: Once | INTRAVENOUS | Status: AC
  Administered 2018-06-27: 09:00:00 via INTRAVENOUS
  Filled 2018-06-27: qty 10

## 2018-06-27 MED ORDER — SODIUM CHLORIDE 0.9% FLUSH
10.0000 mL | INTRAVENOUS | Status: DC | PRN
  Administered 2018-06-27: 10 mL
  Filled 2018-06-27: qty 10

## 2018-06-27 NOTE — Patient Instructions (Signed)
[facility] Discharge Instructions for Patients Receiving Chemotherapy  Today you received the following chemotherapy agents Gemzar; Cisplatin  To help prevent nausea and vomiting after your treatment, we encourage you to take your nausea medication as directed. If you develop nausea and vomiting that is not controlled by your nausea medication, call the clinic.   BELOW ARE SYMPTOMS THAT SHOULD BE REPORTED IMMEDIATELY:  *FEVER GREATER THAN 100.5 F  *CHILLS WITH OR WITHOUT FEVER  NAUSEA AND VOMITING THAT IS NOT CONTROLLED WITH YOUR NAUSEA MEDICATION  *UNUSUAL SHORTNESS OF BREATH  *UNUSUAL BRUISING OR BLEEDING  TENDERNESS IN MOUTH AND THROAT WITH OR WITHOUT PRESENCE OF ULCERS  *URINARY PROBLEMS  *BOWEL PROBLEMS  UNUSUAL RASH Items with * indicate a potential emergency and should be followed up as soon as possible.  Feel free to call the clinic should you have any questions or concerns. The clinic phone number is [phone].  Please show the [facility] at check-in to the Emergency Department and triage nurse.   Gemcitabine injection What is this medicine? GEMCITABINE (jem SYE ta been) is a chemotherapy drug. This medicine is used to treat many types of cancer like breast cancer, lung cancer, pancreatic cancer, and ovarian cancer. This medicine may be used for other purposes; ask your health care provider or pharmacist if you have questions. COMMON BRAND NAME(S): Gemzar, Infugem What should I tell my health care provider before I take this medicine? They need to know if you have any of these conditions: -blood disorders -infection -kidney disease -liver disease -lung or breathing disease, like asthma -recent or ongoing radiation therapy -an unusual or allergic reaction to gemcitabine, other chemotherapy, other medicines, foods, dyes, or preservatives -pregnant or trying to get pregnant -breast-feeding How should I use this medicine? This drug is  given as an infusion into a vein. It is administered in a hospital or clinic by a specially trained health care professional. Talk to your pediatrician regarding the use of this medicine in children. Special care may be needed. Overdosage: If you think you have taken too much of this medicine contact a poison control center or emergency room at once. NOTE: This medicine is only for you. Do not share this medicine with others. What if I miss a dose? It is important not to miss your dose. Call your doctor or health care professional if you are unable to keep an appointment. What may interact with this medicine? -medicines to increase blood counts like filgrastim, pegfilgrastim, sargramostim -some other chemotherapy drugs like cisplatin -vaccines Talk to your doctor or health care professional before taking any of these medicines: -acetaminophen -aspirin -ibuprofen -ketoprofen -naproxen This list may not describe all possible interactions. Give your health care provider a list of all the medicines, herbs, non-prescription drugs, or dietary supplements you use. Also tell them if you smoke, drink alcohol, or use illegal drugs. Some items may interact with your medicine. What should I watch for while using this medicine? Visit your doctor for checks on your progress. This drug may make you feel generally unwell. This is not uncommon, as chemotherapy can affect healthy cells as well as cancer cells. Report any side effects. Continue your course of treatment even though you feel ill unless your doctor tells you to stop. In some cases, you may be given additional medicines to help with side effects. Follow all directions for their use. Call your doctor or health care professional for advice if you get a fever, chills or sore throat,  or other symptoms of a cold or flu. Do not treat yourself. This drug decreases your body's ability to fight infections. Try to avoid being around people who are sick. This  medicine may increase your risk to bruise or bleed. Call your doctor or health care professional if you notice any unusual bleeding. Be careful brushing and flossing your teeth or using a toothpick because you may get an infection or bleed more easily. If you have any dental work done, tell your dentist you are receiving this medicine. Avoid taking products that contain aspirin, acetaminophen, ibuprofen, naproxen, or ketoprofen unless instructed by your doctor. These medicines may hide a fever. Do not become pregnant while taking this medicine or for 6 months after stopping it. Women should inform their doctor if they wish to become pregnant or think they might be pregnant. Men should not father a child while taking this medicine and for 3 months after stopping it. There is a potential for serious side effects to an unborn child. Talk to your health care professional or pharmacist for more information. Do not breast-feed an infant while taking this medicine or for at least 1 week after stopping it. Men should inform their doctors if they wish to father a child. This medicine may lower sperm counts. Talk with your doctor or health care professional if you are concerned about your fertility. What side effects may I notice from receiving this medicine? Side effects that you should report to your doctor or health care professional as soon as possible: -allergic reactions like skin rash, itching or hives, swelling of the face, lips, or tongue -breathing problems -pain, redness, or irritation at site where injected -signs and symptoms of a dangerous change in heartbeat or heart rhythm like chest pain; dizziness; fast or irregular heartbeat; palpitations; feeling faint or lightheaded, falls; breathing problems -signs of decreased platelets or bleeding - bruising, pinpoint red spots on the skin, black, tarry stools, blood in the urine -signs of decreased red blood cells - unusually weak or tired, feeling faint  or lightheaded, falls -signs of infection - fever or chills, cough, sore throat, pain or difficulty passing urine -signs and symptoms of kidney injury like trouble passing urine or change in the amount of urine -signs and symptoms of liver injury like dark yellow or brown urine; general ill feeling or flu-like symptoms; light-colored stools; loss of appetite; nausea; right upper belly pain; unusually weak or tired; yellowing of the eyes or skin -swelling of ankles, feet, hands Side effects that usually do not require medical attention (report to your doctor or health care professional if they continue or are bothersome): -constipation -diarrhea -hair loss -loss of appetite -nausea -rash -vomiting This list may not describe all possible side effects. Call your doctor for medical advice about side effects. You may report side effects to FDA at 1-800-FDA-1088. Where should I keep my medicine? This drug is given in a hospital or clinic and will not be stored at home. NOTE: This sheet is a summary. It may not cover all possible information. If you have questions about this medicine, talk to your doctor, pharmacist, or health care provider.  2019 [employer] Standard (2017-08-07 18:06:11)  Cisplatin injection What is this medicine? CISPLATIN (SIS pla tin) is a chemotherapy drug. It targets fast dividing cells, like cancer cells, and causes these cells to die. This medicine is used to treat many types of cancer like bladder, ovarian, and testicular cancers. This medicine may be used for other purposes; ask your  health care provider or pharmacist if you have questions. COMMON BRAND NAME(S): Platinol, Platinol -AQ What should I tell my health care provider before I take this medicine? They need to know if you have any of these conditions: -blood disorders -hearing problems -kidney disease -recent or ongoing radiation therapy -an unusual or allergic reaction to cisplatin, carboplatin, other  chemotherapy, other medicines, foods, dyes, or preservatives -pregnant or trying to get pregnant -breast-feeding How should I use this medicine? This drug is given as an infusion into a vein. It is administered in a hospital or clinic by a specially trained health care professional. Talk to your pediatrician regarding the use of this medicine in children. Special care may be needed. Overdosage: If you think you have taken too much of this medicine contact a poison control center or emergency room at once. NOTE: This medicine is only for you. Do not share this medicine with others. What if I miss a dose? It is important not to miss a dose. Call your doctor or health care professional if you are unable to keep an appointment. What may interact with this medicine? -dofetilide -foscarnet -medicines for seizures -medicines to increase blood counts like filgrastim, pegfilgrastim, sargramostim -probenecid -pyridoxine used with altretamine -rituximab -some antibiotics like amikacin, gentamicin, neomycin, polymyxin B, streptomycin, tobramycin -sulfinpyrazone -vaccines -zalcitabine Talk to your doctor or health care professional before taking any of these medicines: -acetaminophen -aspirin -ibuprofen -ketoprofen -naproxen This list may not describe all possible interactions. Give your health care provider a list of all the medicines, herbs, non-prescription drugs, or dietary supplements you use. Also tell them if you smoke, drink alcohol, or use illegal drugs. Some items may interact with your medicine. What should I watch for while using this medicine? Your condition will be monitored carefully while you are receiving this medicine. You will need important blood work done while you are taking this medicine. This drug may make you feel generally unwell. This is not uncommon, as chemotherapy can affect healthy cells as well as cancer cells. Report any side effects. Continue your course of  treatment even though you feel ill unless your doctor tells you to stop. In some cases, you may be given additional medicines to help with side effects. Follow all directions for their use. Call your doctor or health care professional for advice if you get a fever, chills or sore throat, or other symptoms of a cold or flu. Do not treat yourself. This drug decreases your body's ability to fight infections. Try to avoid being around people who are sick. This medicine may increase your risk to bruise or bleed. Call your doctor or health care professional if you notice any unusual bleeding. Be careful brushing and flossing your teeth or using a toothpick because you may get an infection or bleed more easily. If you have any dental work done, tell your dentist you are receiving this medicine. Avoid taking products that contain aspirin, acetaminophen, ibuprofen, naproxen, or ketoprofen unless instructed by your doctor. These medicines may hide a fever. Do not become pregnant while taking this medicine. Women should inform their doctor if they wish to become pregnant or think they might be pregnant. There is a potential for serious side effects to an unborn child. Talk to your health care professional or pharmacist for more information. Do not breast-feed an infant while taking this medicine. Drink fluids as directed while you are taking this medicine. This will help protect your kidneys. Call your doctor or health care professional if  you get diarrhea. Do not treat yourself. What side effects may I notice from receiving this medicine? Side effects that you should report to your doctor or health care professional as soon as possible: -allergic reactions like skin rash, itching or hives, swelling of the face, lips, or tongue -signs of infection - fever or chills, cough, sore throat, pain or difficulty passing urine -signs of decreased platelets or bleeding - bruising, pinpoint red spots on the skin, black,  tarry stools, nosebleeds -signs of decreased red blood cells - unusually weak or tired, fainting spells, lightheadedness -breathing problems -changes in hearing -gout pain -low blood counts - This drug may decrease the number of white blood cells, red blood cells and platelets. You may be at increased risk for infections and bleeding. -nausea and vomiting -pain, swelling, redness or irritation at the injection site -pain, tingling, numbness in the hands or feet -problems with balance, movement -trouble passing urine or change in the amount of urine Side effects that usually do not require medical attention (report to your doctor or health care professional if they continue or are bothersome): -changes in vision -loss of appetite -metallic taste in the mouth or changes in taste This list may not describe all possible side effects. Call your doctor for medical advice about side effects. You may report side effects to FDA at 1-800-FDA-1088. Where should I keep my medicine? This drug is given in a hospital or clinic and will not be stored at home. NOTE: This sheet is a summary. It may not cover all possible information. If you have questions about this medicine, talk to your doctor, pharmacist, or health care provider.  2019 [employer]vier/Gold Standard (2007-08-19 14:40:54)

## 2018-06-27 NOTE — Progress Notes (Signed)
Per Dr. Alen Blew Pt OK to treat with current labs.   At 1217 patient began to sweat during first time infusion of Gemzar. No other acute symptoms. Infusion was stopped and the emergency protocol was initiated. Vitals were taken and stable. Sandi Mealy, PA came and prescribed Pepcid and fluids before restarting the Gemzar at it's original rate. Vitals were taken after the pepcid and the patient's symptoms resolved. Treatment was reinstated and the patient is at baseline.

## 2018-06-30 ENCOUNTER — Encounter (HOSPITAL_COMMUNITY)
Admission: RE | Admit: 2018-06-30 | Discharge: 2018-06-30 | Disposition: A | Discharge status: Home / Self Care | Payer: Medicare Other | Source: Ambulatory Visit | Attending: Oncology | Admitting: Oncology

## 2018-06-30 ENCOUNTER — Telehealth: Payer: Self-pay

## 2018-06-30 ENCOUNTER — Other Ambulatory Visit: Payer: Self-pay | Admitting: Family Medicine

## 2018-06-30 DIAGNOSIS — C678 Malignant neoplasm of overlapping sites of bladder: Secondary | ICD-10-CM | POA: Diagnosis not present

## 2018-06-30 DIAGNOSIS — E119 Type 2 diabetes mellitus without complications: Secondary | ICD-10-CM

## 2018-06-30 LAB — GLUCOSE, CAPILLARY: GLUCOSE-CAPILLARY: 168 mg/dL — AB (ref 70–99)

## 2018-06-30 MED ORDER — FLUDEOXYGLUCOSE F - 18 (FDG) INJECTION
9.0900 | Freq: Once | INTRAVENOUS | Status: AC | PRN
  Administered 2018-06-30: 9.09 via INTRAVENOUS

## 2018-06-30 NOTE — Telephone Encounter (Signed)
Follow up call with patient regarding on-call message of patient report of burn to scrotum from using the urinal during his chemo infusion. He stated that he is feeling better today and is improving each day. Patient stated that he had to use the urinal constantly due to a constant urge to urinate. He is not able to go to the restroom in enough time and is concerned that this will happen again. Discussed use of a condom catheter to prevent any further burns from urine. Explained that use of condom cath during chemo treatments will be updated in his chart but to also make the nurse aware prior to treatment. Patient was agreeable with plan and verbalized understanding and had no other questions or concerns.

## 2018-07-02 ENCOUNTER — Encounter: Payer: Self-pay | Admitting: Oncology

## 2018-07-02 ENCOUNTER — Telehealth: Payer: Self-pay

## 2018-07-02 NOTE — Telephone Encounter (Signed)
Received call from patient spouse with questions regarding the EMLA cream application and clarification of appointments. Spouse also verbalized concern about the patient burn due to urinal use. Explained that his chart has a note requesting use of a condom cath during chemo treatments. Also discussed that tomorrow will be a shorter treatment without all the fluids he received last time. Explained that if patient prefers to use a condom catheter to let the infusion room RN know. [name] was appreciative of information and had no other questions or concerns.

## 2018-07-03 ENCOUNTER — Inpatient Hospital Stay: Payer: Medicare Other

## 2018-07-03 ENCOUNTER — Encounter: Payer: Self-pay | Admitting: Oncology

## 2018-07-03 ENCOUNTER — Telehealth: Payer: Self-pay

## 2018-07-03 ENCOUNTER — Inpatient Hospital Stay: Payer: Medicare Other | Attending: Oncology

## 2018-07-03 ENCOUNTER — Inpatient Hospital Stay (HOSPITAL_BASED_OUTPATIENT_CLINIC_OR_DEPARTMENT_OTHER): Payer: Medicare Other | Admitting: Oncology

## 2018-07-03 VITALS — BP 142/78 | HR 70 | Temp 98.2°F | Resp 17 | Ht 72.0 in | Wt 177.8 lb

## 2018-07-03 DIAGNOSIS — C679 Malignant neoplasm of bladder, unspecified: Secondary | ICD-10-CM

## 2018-07-03 DIAGNOSIS — C678 Malignant neoplasm of overlapping sites of bladder: Secondary | ICD-10-CM

## 2018-07-03 DIAGNOSIS — Z95828 Presence of other vascular implants and grafts: Secondary | ICD-10-CM | POA: Insufficient documentation

## 2018-07-03 DIAGNOSIS — Z5111 Encounter for antineoplastic chemotherapy: Secondary | ICD-10-CM | POA: Insufficient documentation

## 2018-07-03 DIAGNOSIS — D709 Neutropenia, unspecified: Secondary | ICD-10-CM | POA: Diagnosis not present

## 2018-07-03 LAB — CMP (CANCER CENTER ONLY)
ALBUMIN: 3.6 g/dL (ref 3.5–5.0)
ALT: 30 U/L (ref 0–44)
AST: 21 U/L (ref 15–41)
Alkaline Phosphatase: 74 U/L (ref 38–126)
Anion gap: 9 (ref 5–15)
BUN: 29 mg/dL — ABNORMAL HIGH (ref 8–23)
CO2: 27 mmol/L (ref 22–32)
Calcium: 9.6 mg/dL (ref 8.9–10.3)
Chloride: 100 mmol/L (ref 98–111)
Creatinine: 1.01 mg/dL (ref 0.61–1.24)
GFR, Est AFR Am: >60 mL/min (ref >60)
GFR, Estimated: >60 mL/min (ref >60)
Glucose, Bld: 159 mg/dL — ABNORMAL HIGH (ref 70–99)
Potassium: 4.5 mmol/L (ref 3.5–5.1)
Sodium: 136 mmol/L (ref 135–145)
Total Bilirubin: 0.7 mg/dL (ref 0.3–1.2)
Total Protein: 6.9 g/dL (ref 6.5–8.1)

## 2018-07-03 LAB — CBC WITH DIFFERENTIAL (CANCER CENTER ONLY)
Abs Immature Granulocytes: 0.04 10*3/uL (ref 0.00–0.07)
Basophils Absolute: 0 10*3/uL (ref 0.0–0.1)
Basophils Relative: 1 %
Eosinophils Absolute: 0.1 10*3/uL (ref 0.0–0.5)
Eosinophils Relative: 1 %
HCT: 35.6 % — ABNORMAL LOW (ref 39.0–52.0)
Hemoglobin: 12.3 g/dL — ABNORMAL LOW (ref 13.0–17.0)
IMMATURE GRANULOCYTES: 1 %
Lymphocytes Relative: 11 %
Lymphs Abs: 0.9 10*3/uL (ref 0.7–4.0)
MCH: 30.3 pg (ref 26.0–34.0)
MCHC: 34.6 g/dL (ref 30.0–36.0)
MCV: 87.7 fL (ref 80.0–100.0)
Monocytes Absolute: 0.2 10*3/uL (ref 0.1–1.0)
Monocytes Relative: 2 %
NEUTROS PCT: 84 %
Neutro Abs: 7.3 10*3/uL (ref 1.7–7.7)
Platelet Count: 239 10*3/uL (ref 150–400)
RBC: 4.06 MIL/uL — ABNORMAL LOW (ref 4.22–5.81)
RDW: 12 % (ref 11.5–15.5)
WBC Count: 8.5 10*3/uL (ref 4.0–10.5)
nRBC: 0 % (ref 0.0–0.2)

## 2018-07-03 LAB — MAGNESIUM: Magnesium: 1.7 mg/dL (ref 1.7–2.4)

## 2018-07-03 MED ORDER — PROCHLORPERAZINE MALEATE 10 MG PO TABS
ORAL_TABLET | ORAL | Status: AC
  Filled 2018-07-03: qty 1

## 2018-07-03 MED ORDER — SODIUM CHLORIDE 0.9 % IV SOLN
2000.0000 mg | Freq: Once | INTRAVENOUS | Status: AC
  Administered 2018-07-03: 2000 mg via INTRAVENOUS
  Filled 2018-07-03: qty 52.6

## 2018-07-03 MED ORDER — SODIUM CHLORIDE 0.9% FLUSH
10.0000 mL | INTRAVENOUS | Status: DC | PRN
  Administered 2018-07-03: 10 mL
  Filled 2018-07-03: qty 10

## 2018-07-03 MED ORDER — PROCHLORPERAZINE MALEATE 10 MG PO TABS
10.0000 mg | ORAL_TABLET | Freq: Once | ORAL | Status: AC
  Administered 2018-07-03: 10 mg via ORAL

## 2018-07-03 MED ORDER — SODIUM CHLORIDE 0.9 % IV SOLN
Freq: Once | INTRAVENOUS | Status: AC
  Administered 2018-07-03: 11:00:00 via INTRAVENOUS
  Filled 2018-07-03: qty 250

## 2018-07-03 MED ORDER — SODIUM CHLORIDE 0.9 % IV SOLN
Freq: Once | INTRAVENOUS | Status: DC
  Filled 2018-07-03: qty 250

## 2018-07-03 MED ORDER — HEPARIN SOD (PORK) LOCK FLUSH 100 UNIT/ML IV SOLN
500.0000 [IU] | Freq: Once | INTRAVENOUS | Status: AC | PRN
  Administered 2018-07-03: 500 [IU]
  Filled 2018-07-03: qty 5

## 2018-07-03 MED ORDER — SODIUM CHLORIDE 0.9% FLUSH
10.0000 mL | Freq: Once | INTRAVENOUS | Status: AC
  Administered 2018-07-03: 10 mL
  Filled 2018-07-03: qty 10

## 2018-07-03 NOTE — Progress Notes (Signed)
Hematology and Oncology Follow Up Visit  Sergio Pennington [id number] [birth year] 68 y.o. 07/03/2018 10:25 AM [name], [name], MDCopland, [name], MD   Principle Diagnosis: 68 year old with stage IV bladder cancer diagnosed in January 2020.  Presented with a bladder mass and lymphadenopathy in the pelvic and retroperitoneal area.   Prior Therapy:  Status post TURBT on January 28 of 2020 showed a large tumor with the pathology showed high-grade urothelial carcinoma.  Current therapy: Chemotherapy utilizing cisplatin and gemcitabine on day 1 and gemcitabine on day 8 out of a 21-day cycle.  He is here for day 8 of cycle 1.  Interim History: [name] returns today for a repeat evaluation.  Since the last visit, he received the first day of cycle 1 of chemotherapy utilizing gemcitabine and cisplatin.  He tolerated chemotherapy well without any nausea or vomiting or infusion related complications.  Did have urinary incontinence while receiving chemotherapy and had urine dribbling with scrotal irritation related to that.  He applied hydrocortisone cream on his scrotum which is healing reasonably well at this time.  He denies any other complications at this time.  His performance status and quality of life remains excellent.  Patient denied any alteration mental status, neuropathy, confusion or dizziness.  Denies any headaches or lethargy.  Denies any night sweats, weight loss or changes in appetite.  Denied orthopnea, dyspnea on exertion or chest discomfort.  Denies shortness of breath, difficulty breathing hemoptysis or cough.  Denies any abdominal distention, nausea, early satiety or dyspepsia.  Denies any hematuria, frequency, dysuria or nocturia.  Denies any skin irritation, dryness or rash.  Denies any ecchymosis or petechiae.  Denies any lymphadenopathy or clotting.  Denies any heat or cold intolerance.  Denies any anxiety or depression.  Remaining review of system is  negative.        Medications: I have reviewed the patient's current medications.  Current Outpatient Medications  Medication Sig Dispense Refill  . Acetaminophen (TYLENOL PO) Take by mouth every 6 (six) hours as needed.    . Cholecalciferol (VITAMIN D3) 125 MCG (5000 UT) CAPS Take 5,000 Units by mouth daily.    . Cyanocobalamin (VITAMIN B-12) 5000 MCG TBDP Take 5,000 mcg by mouth daily.     . Digestive Enzymes (DIGESTIVE ENZYME PO) Take 1 capsule by mouth 2 (two) times daily.     . diphenhydrAMINE (BENADRYL) 25 MG tablet Take 25 mg by mouth at bedtime.    . Emollient (CERAVE) CREA Apply 1 application topically 2 (two) times daily.    Marland Kitchen HYDROcodone-acetaminophen (NORCO/VICODIN) 5-325 MG tablet Take 1 tablet by mouth every 4 (four) hours as needed for moderate pain. 12 tablet 0  . lidocaine-prilocaine (EMLA) cream Apply 1 application topically as needed. 30 g 0  . Menaquinone-7 (VITAMIN K2 PO) Take 50 mcg by mouth daily.     . metFORMIN (GLUCOPHAGE) 500 MG tablet TAKE 1 TABLET (500 MG TOTAL) BY MOUTH 2 (TWO) TIMES DAILY WITH A MEAL. 30 tablet 0  . OVER THE COUNTER MEDICATION Take 240 mLs by mouth daily. Asca Liquid Supplement    . OVER THE COUNTER MEDICATION 1,000 mg 2 (two) times daily. Graviola    . OVER THE COUNTER MEDICATION CTFO super fruit chew    . OVER THE COUNTER MEDICATION Alive Men's 50+ gummy    . OVER THE COUNTER MEDICATION Elderberry syrup with turmeric    . prochlorperazine (COMPAZINE) 10 MG tablet Take 1 tablet (10 mg total) by mouth every 6 (six) hours  as needed for nausea or vomiting. 30 tablet 0  . Sennosides-Docusate Sodium (STOOL SOFTENER & LAXATIVE PO) Take by mouth 2 (two) times daily.    . tamsulosin (FLOMAX) 0.4 MG CAPS capsule Take 0.4 mg by mouth daily.    Marland Kitchen tolnaftate (TINACTIN) 1 % spray Apply 1 spray topically daily as needed (for feet itching).    . triamcinolone cream (KENALOG) 0.1 % APPLY TO AFFECTED AREA TWICE A DAY (Patient taking differently: Apply 1  application topically 2 (two) times daily as needed (for itching). ) 80 g 1  . UNABLE TO FIND 2 (two) times daily as needed (for constipation). Triphala    . UNABLE TO FIND 1,000 mg 3 (three) times daily. Med Name:[facility]    . Zinc 30 MG TABS Take by mouth daily.     No current facility-administered medications for this visit.      Allergies:  Allergies  Allergen Reactions  . Sulfa Antibiotics Hives    Past Medical History, Surgical history, Social history, and Family History were reviewed and updated.  Review of Systems:  Remaining ROS negative.  Physical Exam: Blood pressure (!) 142/78, pulse 70, temperature 98.2 F (36.8 C), temperature source Oral, resp. rate 17, height 6' (1.829 m), weight 177 lb 12.8 oz (80.6 kg), SpO2 98 %. ECOG: 0   General appearance: Comfortable appearing without any discomfort Head: Normocephalic without any trauma Oropharynx: Mucous membranes are moist and pink without any thrush or ulcers. Eyes: Pupils are equal and round reactive to light. Lymph nodes: No cervical, supraclavicular, inguinal or axillary lymphadenopathy.   Heart:regular rate and rhythm.  S1 and S2 without leg edema. Lung: Clear without any rhonchi or wheezes.  No dullness to percussion. Abdomin: Soft, nontender, nondistended with good bowel sounds.  No hepatosplenomegaly. GU exam: Slight irritation noted on his scrotum.  No masses or lesions. Musculoskeletal: No joint deformity or effusion.  Full range of motion noted. Neurological: No deficits noted on motor, sensory and deep tendon reflex exam. Skin: No petechial rash or dryness.  Appeared moist.  Psychiatric: Mood and affect appeared appropriate.    Lab Results: Lab Results  Component Value Date   WBC 8.5 07/03/2018   HGB 12.3 (L) 07/03/2018   HCT 35.6 (L) 07/03/2018   MCV 87.7 07/03/2018   PLT 239 07/03/2018     Chemistry      Component Value Date/Time   NA 138 06/26/2018 1208   K 3.8 06/26/2018 1208   CL 102  06/26/2018 1208   CO2 26 06/26/2018 1208   BUN 25 (H) 06/26/2018 1208   CREATININE 0.82 06/26/2018 1208   CREATININE 1.05 01/17/2015 1559      Component Value Date/Time   CALCIUM 8.9 06/26/2018 1208   ALKPHOS 58 06/26/2018 1208   AST 21 06/26/2018 1208   ALT 20 06/26/2018 1208   BILITOT 0.3 06/26/2018 1208       Radiological Studies: EXAM: NUCLEAR MEDICINE PET SKULL BASE TO THIGH  TECHNIQUE: 9.09 mCi F-18 FDG was injected intravenously. Full-ring PET imaging was performed from the skull base to thigh after the radiotracer. CT data was obtained and used for attenuation correction and anatomic localization.  Fasting blood glucose: 168 mg/dl  COMPARISON:  None  FINDINGS: Mediastinal blood pool activity: SUV max 2.47  NECK: No hypermetabolic lymph nodes in the neck.  Incidental CT findings: none  CHEST: No hypermetabolic supraclavicular, axillary, mediastinal or hilar lymph nodes. No pleural effusion identified. Small nodule in the posterior left lower lobe measures 4  mm, image 35/9. Too small to characterize by PET-CT. Scattered scar like densities are identified in both lungs. No pleural effusion.  Incidental CT findings: Aortic atherosclerosis. Calcification in the LAD coronary artery noted.  ABDOMEN/PELVIS: No abnormal uptake identified within the liver. Normal right adrenal gland.  No abnormal uptake within the adrenal glands. Intense radiotracer uptake associated with the bladder mass has an SUV max of 7.45.  Retroperitoneal and pelvic adenopathy is again identified. Index left retroperitoneal lymph node measures 1.2 cm within SUV max of 5.69. Right common iliac lymph node measures 1.4 cm and has an SUV max of 6.20. Left external iliac lymph node measures 1.5 cm and has an SUV max of 5.39. Right external iliac lymph node measures 1.2 cm within SUV max of 6.12. No hypermetabolic inguinal lymph nodes.  Incidental CT findings: Aortic  atherosclerosis. Left adrenal adenoma is again noted measuring 1.5 cm. Previously characterized Bosniak category 89F lesion in the right inferior pole is again noted.  SKELETON: No focal hypermetabolic activity to suggest skeletal metastasis.  Incidental CT findings: none  IMPRESSION: 1. Large bladder mass is again noted exhibiting moderate increased radiotracer uptake compatible with primary urothelial carcinoma. 2. Enlarged and hypermetabolic retroperitoneal and bilateral iliac lymph nodes compatible with metastatic adenopathy. 3. Left adrenal gland adenoma. 4. Aortic Atherosclerosis (ICD10-I70.0). Lad coronary artery atherosclerotic calcifications noted  Impression and Plan:  68 year old man with:  1.  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  His PET CT scan obtained on June 30, 2018 was personally reviewed and discussed with the patient today.  His disease is predominantly into the lymph nodes and the plan is to proceed and continue with chemotherapy.  Risks and benefits of continuing this regimen was discussed today and is agreeable to continue.  Plan is to repeat imaging studies after 3 cycles and consideration for surgical resection would be made after completing 6 cycles.  2.  IV access: Port-A-Cath inserted without any complications and continues to be in use.  3.  Antiemetics: No issues with nausea or vomiting at this time.  Compazine is available to him.  4.  Renal function surveillance: Creatinine remains stable without any issues at this time this will be monitored on cisplatin therapy.  5.  Scrotal irritation: He will use condom catheter with each chemotherapy treatment.  I urged him to continue to apply hydrocortisone cream and avoid any irritation or urine dribble  5.  Goals of care: Aggressive therapy is warranted given his performance status and his disease remains potentially  curable.  6.  Follow-up: We will be in 2 weeks to start cycle 2 of therapy.  25  minutes was spent with the patient face-to-face today.  More than 50% of time was dedicated to reviewing laboratory data, imaging studies and answering questions regarding future plan of care.   Zola Button, MD 2/6/202010:25 AM

## 2018-07-03 NOTE — Telephone Encounter (Signed)
Printed avs and calender of upcoming appointment. Per 2/6 los 

## 2018-07-03 NOTE — Progress Notes (Signed)
    DATE:  06/27/2018                                          X CHEMO/IMMUNOTHERAPY REACTION             MD:  Dr. Zola Button   AGENT/BLOOD PRODUCT RECEIVING TODAY:               Gemcitabine   AGENT/BLOOD PRODUCT RECEIVING IMMEDIATELY PRIOR TO REACTION:           Gemcitabine   VS: BP:      138/69   P:        72       SPO2:        99% on room air                   REACTION(S):            Sweats   PREMEDS:      Aloxi and Emend   INTERVENTION: Pepcid 20 mg IV x1   Review of Systems  Review of Systems  Constitutional: Positive for diaphoresis. Negative for chills and fever.  HENT: Negative for trouble swallowing and voice change.   Respiratory: Negative for cough, chest tightness, shortness of breath and wheezing.   Cardiovascular: Negative for chest pain and palpitations.  Gastrointestinal: Negative for abdominal pain, constipation, diarrhea, nausea and vomiting.  Musculoskeletal: Negative for back pain and myalgias.  Neurological: Negative for dizziness, light-headedness and headaches.     Physical Exam  Physical Exam Constitutional:      General: He is not in acute distress.    Appearance: He is not diaphoretic.  HENT:     Head: Normocephalic and atraumatic.  Cardiovascular:     Rate and Rhythm: Normal rate and regular rhythm.     Heart sounds: Normal heart sounds. No murmur. No friction rub. No gallop.   Pulmonary:     Effort: Pulmonary effort is normal. No respiratory distress.     Breath sounds: Normal breath sounds. No wheezing or rales.  Skin:    General: Skin is warm and dry.     Findings: No erythema or rash.  Neurological:     Mental Status: He is alert.     OUTCOME:                Gemcitabine was paused and the patient was given Pepcid 20 mg IV x1.  His symptoms did not recur.  He was able to complete the remainder of his infusion of gemcitabine without additional issues of concern.   Sandi Mealy, MHS, PA-C

## 2018-07-03 NOTE — Patient Instructions (Signed)
[facility] Discharge Instructions for Patients Receiving Chemotherapy  Today you received the following chemotherapy agents Gemzar  To help prevent nausea and vomiting after your treatment, we encourage you to take your nausea medication as directed.    If you develop nausea and vomiting that is not controlled by your nausea medication, call the clinic.   BELOW ARE SYMPTOMS THAT SHOULD BE REPORTED IMMEDIATELY:  *FEVER GREATER THAN 100.5 F  *CHILLS WITH OR WITHOUT FEVER  NAUSEA AND VOMITING THAT IS NOT CONTROLLED WITH YOUR NAUSEA MEDICATION  *UNUSUAL SHORTNESS OF BREATH  *UNUSUAL BRUISING OR BLEEDING  TENDERNESS IN MOUTH AND THROAT WITH OR WITHOUT PRESENCE OF ULCERS  *URINARY PROBLEMS  *BOWEL PROBLEMS  UNUSUAL RASH Items with * indicate a potential emergency and should be followed up as soon as possible.  Feel free to call the clinic should you have any questions or concerns. The clinic phone number is [phone].  Please show the CHEMO ALERT CARD at check-in to the Emergency Department and triage nurse.    Gemcitabine injection What is this medicine? GEMCITABINE (jem SYE ta been) is a chemotherapy drug. This medicine is used to treat many types of cancer like breast cancer, lung cancer, pancreatic cancer, and ovarian cancer. This medicine may be used for other purposes; ask your health care provider or pharmacist if you have questions. COMMON BRAND NAME(S): Gemzar, Infugem What should I tell my health care provider before I take this medicine? They need to know if you have any of these conditions: -blood disorders -infection -kidney disease -liver disease -lung or breathing disease, like asthma -recent or ongoing radiation therapy -an unusual or allergic reaction to gemcitabine, other chemotherapy, other medicines, foods, dyes, or preservatives -pregnant or trying to get pregnant -breast-feeding How should I use this medicine? This drug is given  as an infusion into a vein. It is administered in a hospital or clinic by a specially trained health care professional. Talk to your pediatrician regarding the use of this medicine in children. Special care may be needed. Overdosage: If you think you have taken too much of this medicine contact a poison control center or emergency room at once. NOTE: This medicine is only for you. Do not share this medicine with others. What if I miss a dose? It is important not to miss your dose. Call your doctor or health care professional if you are unable to keep an appointment. What may interact with this medicine? -medicines to increase blood counts like filgrastim, pegfilgrastim, sargramostim -some other chemotherapy drugs like cisplatin -vaccines Talk to your doctor or health care professional before taking any of these medicines: -acetaminophen -aspirin -ibuprofen -ketoprofen -naproxen This list may not describe all possible interactions. Give your health care provider a list of all the medicines, herbs, non-prescription drugs, or dietary supplements you use. Also tell them if you smoke, drink alcohol, or use illegal drugs. Some items may interact with your medicine. What should I watch for while using this medicine? Visit your doctor for checks on your progress. This drug may make you feel generally unwell. This is not uncommon, as chemotherapy can affect healthy cells as well as cancer cells. Report any side effects. Continue your course of treatment even though you feel ill unless your doctor tells you to stop. In some cases, you may be given additional medicines to help with side effects. Follow all directions for their use. Call your doctor or health care professional for advice if you get a fever, chills   or sore throat, or other symptoms of a cold or flu. Do not treat yourself. This drug decreases your body's ability to fight infections. Try to avoid being around people who are sick. This medicine  may increase your risk to bruise or bleed. Call your doctor or health care professional if you notice any unusual bleeding. Be careful brushing and flossing your teeth or using a toothpick because you may get an infection or bleed more easily. If you have any dental work done, tell your dentist you are receiving this medicine. Avoid taking products that contain aspirin, acetaminophen, ibuprofen, naproxen, or ketoprofen unless instructed by your doctor. These medicines may hide a fever. Do not become pregnant while taking this medicine or for 6 months after stopping it. Women should inform their doctor if they wish to become pregnant or think they might be pregnant. Men should not father a child while taking this medicine and for 3 months after stopping it. There is a potential for serious side effects to an unborn child. Talk to your health care professional or pharmacist for more information. Do not breast-feed an infant while taking this medicine or for at least 1 week after stopping it. Men should inform their doctors if they wish to father a child. This medicine may lower sperm counts. Talk with your doctor or health care professional if you are concerned about your fertility. What side effects may I notice from receiving this medicine? Side effects that you should report to your doctor or health care professional as soon as possible: -allergic reactions like skin rash, itching or hives, swelling of the face, lips, or tongue -breathing problems -pain, redness, or irritation at site where injected -signs and symptoms of a dangerous change in heartbeat or heart rhythm like chest pain; dizziness; fast or irregular heartbeat; palpitations; feeling faint or lightheaded, falls; breathing problems -signs of decreased platelets or bleeding - bruising, pinpoint red spots on the skin, black, tarry stools, blood in the urine -signs of decreased red blood cells - unusually weak or tired, feeling faint or  lightheaded, falls -signs of infection - fever or chills, cough, sore throat, pain or difficulty passing urine -signs and symptoms of kidney injury like trouble passing urine or change in the amount of urine -signs and symptoms of liver injury like dark yellow or brown urine; general ill feeling or flu-like symptoms; light-colored stools; loss of appetite; nausea; right upper belly pain; unusually weak or tired; yellowing of the eyes or skin -swelling of ankles, feet, hands Side effects that usually do not require medical attention (report to your doctor or health care professional if they continue or are bothersome): -constipation -diarrhea -hair loss -loss of appetite -nausea -rash -vomiting This list may not describe all possible side effects. Call your doctor for medical advice about side effects. You may report side effects to FDA at 1-800-FDA-1088. Where should I keep my medicine? This drug is given in a hospital or clinic and will not be stored at home. NOTE: This sheet is a summary. It may not cover all possible information. If you have questions about this medicine, talk to your doctor, pharmacist, or health care provider.  2019 [employer]vier/Gold Standard (2017-08-07 18:06:11)  

## 2018-07-04 ENCOUNTER — Encounter: Payer: Self-pay | Admitting: Medical

## 2018-07-04 ENCOUNTER — Ambulatory Visit: Payer: Medicare Other

## 2018-07-09 ENCOUNTER — Encounter: Payer: Self-pay | Admitting: Oncology

## 2018-07-10 ENCOUNTER — Telehealth: Payer: Self-pay | Admitting: *Deleted

## 2018-07-10 NOTE — Telephone Encounter (Signed)
Wife asking if she could help with cbc results by changing patient's diet. No.

## 2018-07-17 ENCOUNTER — Inpatient Hospital Stay: Payer: Medicare Other

## 2018-07-17 ENCOUNTER — Telehealth: Payer: Self-pay | Admitting: Oncology

## 2018-07-17 ENCOUNTER — Inpatient Hospital Stay (HOSPITAL_BASED_OUTPATIENT_CLINIC_OR_DEPARTMENT_OTHER): Payer: Medicare Other | Admitting: Oncology

## 2018-07-17 VITALS — BP 157/72 | HR 86 | Temp 97.8°F | Resp 18 | Ht 72.0 in | Wt 182.5 lb

## 2018-07-17 DIAGNOSIS — C678 Malignant neoplasm of overlapping sites of bladder: Secondary | ICD-10-CM

## 2018-07-17 DIAGNOSIS — Z5111 Encounter for antineoplastic chemotherapy: Secondary | ICD-10-CM | POA: Diagnosis not present

## 2018-07-17 DIAGNOSIS — C679 Malignant neoplasm of bladder, unspecified: Secondary | ICD-10-CM

## 2018-07-17 DIAGNOSIS — D709 Neutropenia, unspecified: Secondary | ICD-10-CM | POA: Diagnosis not present

## 2018-07-17 DIAGNOSIS — Z95828 Presence of other vascular implants and grafts: Secondary | ICD-10-CM

## 2018-07-17 LAB — CBC WITH DIFFERENTIAL (CANCER CENTER ONLY)
Abs Immature Granulocytes: 0.03 10*3/uL (ref 0.00–0.07)
BASOS PCT: 1 %
Basophils Absolute: 0 10*3/uL (ref 0.0–0.1)
Eosinophils Absolute: 0.1 10*3/uL (ref 0.0–0.5)
Eosinophils Relative: 2 %
HCT: 31.2 % — ABNORMAL LOW (ref 39.0–52.0)
Hemoglobin: 10.5 g/dL — ABNORMAL LOW (ref 13.0–17.0)
Immature Granulocytes: 1 %
Lymphocytes Relative: 28 %
Lymphs Abs: 0.9 10*3/uL (ref 0.7–4.0)
MCH: 30.7 pg (ref 26.0–34.0)
MCHC: 33.7 g/dL (ref 30.0–36.0)
MCV: 91.2 fL (ref 80.0–100.0)
Monocytes Absolute: 0.6 10*3/uL (ref 0.1–1.0)
Monocytes Relative: 19 %
Neutro Abs: 1.6 10*3/uL — ABNORMAL LOW (ref 1.7–7.7)
Neutrophils Relative %: 49 %
PLATELETS: 598 10*3/uL — AB (ref 150–400)
RBC: 3.42 MIL/uL — ABNORMAL LOW (ref 4.22–5.81)
RDW: 13.7 % (ref 11.5–15.5)
WBC Count: 3.3 10*3/uL — ABNORMAL LOW (ref 4.0–10.5)
nRBC: 0 % (ref 0.0–0.2)

## 2018-07-17 LAB — CMP (CANCER CENTER ONLY)
ALBUMIN: 3.6 g/dL (ref 3.5–5.0)
ALT: 15 U/L (ref 0–44)
AST: 15 U/L (ref 15–41)
Alkaline Phosphatase: 81 U/L (ref 38–126)
Anion gap: 11 (ref 5–15)
BILIRUBIN TOTAL: 0.2 mg/dL — AB (ref 0.3–1.2)
BUN: 20 mg/dL (ref 8–23)
CO2: 24 mmol/L (ref 22–32)
Calcium: 8.8 mg/dL — ABNORMAL LOW (ref 8.9–10.3)
Chloride: 104 mmol/L (ref 98–111)
Creatinine: 1 mg/dL (ref 0.61–1.24)
GFR, Est AFR Am: >60 mL/min (ref >60)
GFR, Estimated: >60 mL/min (ref >60)
GLUCOSE: 247 mg/dL — AB (ref 70–99)
Potassium: 4.5 mmol/L (ref 3.5–5.1)
Sodium: 139 mmol/L (ref 135–145)
Total Protein: 6.7 g/dL (ref 6.5–8.1)

## 2018-07-17 MED ORDER — HEPARIN SOD (PORK) LOCK FLUSH 100 UNIT/ML IV SOLN
500.0000 [IU] | Freq: Once | INTRAVENOUS | Status: AC | PRN
  Administered 2018-07-17: 500 [IU]
  Filled 2018-07-17: qty 5

## 2018-07-17 MED ORDER — SODIUM CHLORIDE 0.9% FLUSH
10.0000 mL | INTRAVENOUS | Status: DC | PRN
  Administered 2018-07-17: 10 mL
  Filled 2018-07-17: qty 10

## 2018-07-17 MED ORDER — SODIUM CHLORIDE 0.9 % IV SOLN
70.0000 mg/m2 | Freq: Once | INTRAVENOUS | Status: AC
  Administered 2018-07-17: 144 mg via INTRAVENOUS
  Filled 2018-07-17: qty 144

## 2018-07-17 MED ORDER — SODIUM CHLORIDE 0.9 % IV SOLN
Freq: Once | INTRAVENOUS | Status: AC
  Administered 2018-07-17: 12:00:00 via INTRAVENOUS
  Filled 2018-07-17: qty 5

## 2018-07-17 MED ORDER — PALONOSETRON HCL INJECTION 0.25 MG/5ML
INTRAVENOUS | Status: AC
  Filled 2018-07-17: qty 5

## 2018-07-17 MED ORDER — SODIUM CHLORIDE 0.9% FLUSH
10.0000 mL | Freq: Once | INTRAVENOUS | Status: AC
  Administered 2018-07-17: 10 mL
  Filled 2018-07-17: qty 10

## 2018-07-17 MED ORDER — SODIUM CHLORIDE 0.9 % IV SOLN
Freq: Once | INTRAVENOUS | Status: AC
  Administered 2018-07-17: 09:00:00 via INTRAVENOUS
  Filled 2018-07-17: qty 250

## 2018-07-17 MED ORDER — PALONOSETRON HCL INJECTION 0.25 MG/5ML
0.2500 mg | Freq: Once | INTRAVENOUS | Status: AC
  Administered 2018-07-17: 0.25 mg via INTRAVENOUS

## 2018-07-17 MED ORDER — POTASSIUM CHLORIDE 2 MEQ/ML IV SOLN
Freq: Once | INTRAVENOUS | Status: AC
  Administered 2018-07-17: 10:00:00 via INTRAVENOUS
  Filled 2018-07-17: qty 10

## 2018-07-17 MED ORDER — SODIUM CHLORIDE 0.9 % IV SOLN
2000.0000 mg | Freq: Once | INTRAVENOUS | Status: AC
  Administered 2018-07-17: 2000 mg via INTRAVENOUS
  Filled 2018-07-17: qty 53

## 2018-07-17 NOTE — Progress Notes (Signed)
Okay to release treatment orders with UOP of 225.

## 2018-07-17 NOTE — Telephone Encounter (Signed)
Gave avs and calendar ° °

## 2018-07-17 NOTE — Progress Notes (Signed)
Hematology and Oncology Follow Up Visit  Sergio Pennington [id number] 02-21-1951 68 y.o. 07/17/2018 8:28 AM [name], [name], MDCopland, [name], MD   Principle Diagnosis: 68 year old with bladder cancer diagnosed with a bladder tumor and lymphadenopathy indicating stage IV in January 2020.     Prior Therapy:  Status post TURBT on January 28 of 2020 showed a large tumor with the pathology showed high-grade urothelial carcinoma.  Current therapy: Chemotherapy utilizing cisplatin and gemcitabine on day 1 and gemcitabine on day 8 out of a 21-day cycle.  He is here for day 1 of cycle 2.  Interim History: [name] is here for a follow-up visit.  Since last visit, he tolerated the last infusion without any major complications.  He did use condom catheter which has cut down on his urinary leaking and was able to tolerate his infusion without any issues.  He denies any nausea, vomiting or infusion related issues.  He denies any excessive fatigue or tiredness.  He denies any worsening neuropathy.  His energy and performance status remains excellent.  Patient denied headaches, blurry vision, syncope or seizures.  Denies any fevers, chills or sweats.  Denied chest pain, palpitation, orthopnea or leg edema.  Denied cough, wheezing or hemoptysis.  Denied nausea, vomiting or abdominal pain.  Denies any constipation or diarrhea.  Denies any frequency urgency or hesitancy.  Denies any arthralgias or myalgias.  Denies any skin rashes or lesions.  Denies any bleeding or clotting tendency.  Denies any easy bruising.  Denies any hair or nail changes.  Denies any anxiety or depression.  Remaining review of system is negative.          Medications: I have reviewed the patient's current medications.  Current Outpatient Medications  Medication Sig Dispense Refill  . Acetaminophen (TYLENOL PO) Take by mouth every 6 (six) hours as needed.    . Cholecalciferol (VITAMIN D3) 125 MCG  (5000 UT) CAPS Take 5,000 Units by mouth daily.    . Cyanocobalamin (VITAMIN B-12) 5000 MCG TBDP Take 5,000 mcg by mouth daily.     . Digestive Enzymes (DIGESTIVE ENZYME PO) Take 1 capsule by mouth 2 (two) times daily.     . diphenhydrAMINE (BENADRYL) 25 MG tablet Take 25 mg by mouth at bedtime.    . Emollient (CERAVE) CREA Apply 1 application topically 2 (two) times daily.    Marland Kitchen HYDROcodone-acetaminophen (NORCO/VICODIN) 5-325 MG tablet Take 1 tablet by mouth every 4 (four) hours as needed for moderate pain. 12 tablet 0  . lidocaine-prilocaine (EMLA) cream Apply 1 application topically as needed. 30 g 0  . Menaquinone-7 (VITAMIN K2 PO) Take 50 mcg by mouth daily.     . metFORMIN (GLUCOPHAGE) 500 MG tablet TAKE 1 TABLET (500 MG TOTAL) BY MOUTH 2 (TWO) TIMES DAILY WITH A MEAL. 30 tablet 0  . OVER THE COUNTER MEDICATION Take 240 mLs by mouth daily. Asca Liquid Supplement    . OVER THE COUNTER MEDICATION 1,000 mg 2 (two) times daily. Graviola    . OVER THE COUNTER MEDICATION CTFO super fruit chew    . OVER THE COUNTER MEDICATION Alive Men's 50+ gummy    . OVER THE COUNTER MEDICATION Elderberry syrup with turmeric    . prochlorperazine (COMPAZINE) 10 MG tablet Take 1 tablet (10 mg total) by mouth every 6 (six) hours as needed for nausea or vomiting. 30 tablet 0  . Sennosides-Docusate Sodium (STOOL SOFTENER & LAXATIVE PO) Take by mouth 2 (two) times daily.    Marland Kitchen  tamsulosin (FLOMAX) 0.4 MG CAPS capsule Take 0.4 mg by mouth daily.    Marland Kitchen tolnaftate (TINACTIN) 1 % spray Apply 1 spray topically daily as needed (for feet itching).    . triamcinolone cream (KENALOG) 0.1 % APPLY TO AFFECTED AREA TWICE A DAY (Patient taking differently: Apply 1 application topically 2 (two) times daily as needed (for itching). ) 80 g 1  . UNABLE TO FIND 2 (two) times daily as needed (for constipation). Triphala    . UNABLE TO FIND 1,000 mg 3 (three) times daily. Med Name:[facility]    . Zinc 30 MG TABS Take by mouth daily.     No  current facility-administered medications for this visit.      Allergies:  Allergies  Allergen Reactions  . Sulfa Antibiotics Hives    Past Medical History, Surgical history, Social history, and Family History were reviewed and updated.    Physical Exam: Blood pressure (!) 157/72, pulse 86, temperature 97.8 F (36.6 C), temperature source Oral, resp. rate 18, height 6' (1.829 m), weight 182 lb 8 oz (82.8 kg), SpO2 100 %.   ECOG: 0   General appearance: Alert, awake without any distress. Head: Atraumatic without abnormalities Oropharynx: Without any thrush or ulcers. Eyes: No scleral icterus. Lymph nodes: No lymphadenopathy noted in the cervical, supraclavicular, or axillary nodes Heart:regular rate and rhythm, without any murmurs or gallops.   Lung: Clear to auscultation without any rhonchi, wheezes or dullness to percussion. Abdomin: Soft, nontender without any shifting dullness or ascites. Musculoskeletal: No clubbing or cyanosis. Neurological: No motor or sensory deficits. Skin: No rashes or lesions.     Lab Results: Lab Results  Component Value Date   WBC 3.3 (L) 07/17/2018   HGB 10.5 (L) 07/17/2018   HCT 31.2 (L) 07/17/2018   MCV 91.2 07/17/2018   PLT 598 (H) 07/17/2018     Chemistry      Component Value Date/Time   NA 136 07/03/2018 0955   K 4.5 07/03/2018 0955   CL 100 07/03/2018 0955   CO2 27 07/03/2018 0955   BUN 29 (H) 07/03/2018 0955   CREATININE 1.01 07/03/2018 0955   CREATININE 1.05 01/17/2015 1559      Component Value Date/Time   CALCIUM 9.6 07/03/2018 0955   ALKPHOS 74 07/03/2018 0955   AST 21 07/03/2018 0955   ALT 30 07/03/2018 0955   BILITOT 0.7 07/03/2018 0955       Impression and Plan:  68 year old man with:  1.  Bladder cancer diagnosed in January 2020.  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   He has tolerated the chemotherapy after the first cycle without any major concerns.   Risks and benefits of continuing this approach was discussed today.  Long-term complication associated with therapy including neutropenia, sepsis, bleeding among others.  The plan is to complete 6 cycles of therapy and if he achieves a complete response, salvage cystectomy will be used at that time.  He is agreeable to proceed with the current plan at this time.  2.  IV access: Port-A-Cath remains in use without any issues.  3.  Antiemetics: No issues reported at this time antiemetics available to him.   4.  Renal function surveillance: Kidney function remains normal at this time we will continue to monitor on this current therapy.  5.  Scrotal irritation: Improved with topical cream and using condom catheter for chemotherapy.  5.  Goals of care: Therapy remains palliative although aggressive therapy is warranted.  This therapy can potentially be curative he had an excellent response.  6.  Neutropenia: He will require growth factor support after day 8 of therapy.  7.  Follow-up: We will be in 1 week for day 8 of the current cycle of therapy.  He will return in 3 weeks for the beginning of the next cycle.  25  minutes was spent with the patient face-to-face today.  More than 50% of time was dedicated to discussing the natural course of his disease, treatment options and outlining future plan of care.   Zola Button, MD 2/20/20208:28 AM

## 2018-07-17 NOTE — Patient Instructions (Signed)
[facility] Discharge Instructions for Patients Receiving Chemotherapy  Today you received the following chemotherapy agents Cisplatin and Gemzar  To help prevent nausea and vomiting after your treatment, we encourage you to take your nausea medication as directed.    If you develop nausea and vomiting that is not controlled by your nausea medication, call the clinic.   BELOW ARE SYMPTOMS THAT SHOULD BE REPORTED IMMEDIATELY:  *FEVER GREATER THAN 100.5 F  *CHILLS WITH OR WITHOUT FEVER  NAUSEA AND VOMITING THAT IS NOT CONTROLLED WITH YOUR NAUSEA MEDICATION  *UNUSUAL SHORTNESS OF BREATH  *UNUSUAL BRUISING OR BLEEDING  TENDERNESS IN MOUTH AND THROAT WITH OR WITHOUT PRESENCE OF ULCERS  *URINARY PROBLEMS  *BOWEL PROBLEMS  UNUSUAL RASH Items with * indicate a potential emergency and should be followed up as soon as possible.  Feel free to call the clinic should you have any questions or concerns. The clinic phone number is [phone].  Please show the CHEMO ALERT CARD at check-in to the Emergency Department and triage nurse.   

## 2018-07-18 ENCOUNTER — Encounter: Payer: Self-pay | Admitting: Oncology

## 2018-07-24 ENCOUNTER — Inpatient Hospital Stay: Payer: Medicare Other

## 2018-07-24 VITALS — BP 161/75 | HR 67 | Temp 97.9°F | Resp 18

## 2018-07-24 DIAGNOSIS — C678 Malignant neoplasm of overlapping sites of bladder: Secondary | ICD-10-CM

## 2018-07-24 DIAGNOSIS — Z5111 Encounter for antineoplastic chemotherapy: Secondary | ICD-10-CM | POA: Diagnosis not present

## 2018-07-24 DIAGNOSIS — Z95828 Presence of other vascular implants and grafts: Secondary | ICD-10-CM

## 2018-07-24 LAB — CMP (CANCER CENTER ONLY)
ALT: 40 U/L (ref 0–44)
AST: 27 U/L (ref 15–41)
Albumin: 3.6 g/dL (ref 3.5–5.0)
Alkaline Phosphatase: 76 U/L (ref 38–126)
Anion gap: 11 (ref 5–15)
BUN: 22 mg/dL (ref 8–23)
CO2: 27 mmol/L (ref 22–32)
CREATININE: 1.24 mg/dL (ref 0.61–1.24)
Calcium: 9 mg/dL (ref 8.9–10.3)
Chloride: 99 mmol/L (ref 98–111)
GFR, Est AFR Am: >60 mL/min (ref >60)
GFR, Estimated: 59 mL/min — ABNORMAL LOW (ref >60)
Glucose, Bld: 243 mg/dL — ABNORMAL HIGH (ref 70–99)
Potassium: 4.4 mmol/L (ref 3.5–5.1)
Sodium: 137 mmol/L (ref 135–145)
Total Bilirubin: 0.3 mg/dL (ref 0.3–1.2)
Total Protein: 6.6 g/dL (ref 6.5–8.1)

## 2018-07-24 LAB — CBC WITH DIFFERENTIAL (CANCER CENTER ONLY)
Abs Immature Granulocytes: 0.06 10*3/uL (ref 0.00–0.07)
Basophils Absolute: 0.1 10*3/uL (ref 0.0–0.1)
Basophils Relative: 2 %
EOS PCT: 1 %
Eosinophils Absolute: 0 10*3/uL (ref 0.0–0.5)
HEMATOCRIT: 30.2 % — AB (ref 39.0–52.0)
Hemoglobin: 10.3 g/dL — ABNORMAL LOW (ref 13.0–17.0)
Immature Granulocytes: 2 %
LYMPHS ABS: 1.1 10*3/uL (ref 0.7–4.0)
Lymphocytes Relative: 31 %
MCH: 30.4 pg (ref 26.0–34.0)
MCHC: 34.1 g/dL (ref 30.0–36.0)
MCV: 89.1 fL (ref 80.0–100.0)
Monocytes Absolute: 0.3 10*3/uL (ref 0.1–1.0)
Monocytes Relative: 8 %
Neutro Abs: 2 10*3/uL (ref 1.7–7.7)
Neutrophils Relative %: 56 %
Platelet Count: 392 10*3/uL (ref 150–400)
RBC: 3.39 MIL/uL — ABNORMAL LOW (ref 4.22–5.81)
RDW: 13.3 % (ref 11.5–15.5)
WBC Count: 3.6 10*3/uL — ABNORMAL LOW (ref 4.0–10.5)
nRBC: 0 % (ref 0.0–0.2)

## 2018-07-24 LAB — MAGNESIUM: Magnesium: 1.4 mg/dL — CL (ref 1.7–2.4)

## 2018-07-24 MED ORDER — SODIUM CHLORIDE 0.9 % IV SOLN
2000.0000 mg | Freq: Once | INTRAVENOUS | Status: AC
  Administered 2018-07-24: 2000 mg via INTRAVENOUS
  Filled 2018-07-24: qty 52.6

## 2018-07-24 MED ORDER — PROCHLORPERAZINE MALEATE 10 MG PO TABS
ORAL_TABLET | ORAL | Status: AC
  Filled 2018-07-24: qty 1

## 2018-07-24 MED ORDER — SODIUM CHLORIDE 0.9 % IV SOLN
Freq: Once | INTRAVENOUS | Status: AC
  Administered 2018-07-24: 09:00:00 via INTRAVENOUS
  Filled 2018-07-24: qty 250

## 2018-07-24 MED ORDER — SODIUM CHLORIDE 0.9% FLUSH
10.0000 mL | INTRAVENOUS | Status: DC | PRN
  Administered 2018-07-24: 10 mL
  Filled 2018-07-24: qty 10

## 2018-07-24 MED ORDER — PROCHLORPERAZINE MALEATE 10 MG PO TABS
10.0000 mg | ORAL_TABLET | Freq: Once | ORAL | Status: AC
  Administered 2018-07-24: 10 mg via ORAL

## 2018-07-24 MED ORDER — SODIUM CHLORIDE 0.9% FLUSH
10.0000 mL | Freq: Once | INTRAVENOUS | Status: AC
  Administered 2018-07-24: 10 mL
  Filled 2018-07-24: qty 10

## 2018-07-24 MED ORDER — HEPARIN SOD (PORK) LOCK FLUSH 100 UNIT/ML IV SOLN
500.0000 [IU] | Freq: Once | INTRAVENOUS | Status: AC | PRN
  Administered 2018-07-24: 500 [IU]
  Filled 2018-07-24: qty 5

## 2018-07-24 NOTE — Patient Instructions (Signed)
[facility] °Discharge Instructions for Patients Receiving Chemotherapy ° °Today you received the following chemotherapy agents Gemzar ° °To help prevent nausea and vomiting after your treatment, we encourage you to take your nausea medication as directed. °  °If you develop nausea and vomiting that is not controlled by your nausea medication, call the clinic.  ° °BELOW ARE SYMPTOMS THAT SHOULD BE REPORTED IMMEDIATELY: °· *FEVER GREATER THAN 100.5 F °· *CHILLS WITH OR WITHOUT FEVER °· NAUSEA AND VOMITING THAT IS NOT CONTROLLED WITH YOUR NAUSEA MEDICATION °· *UNUSUAL SHORTNESS OF BREATH °· *UNUSUAL BRUISING OR BLEEDING °· TENDERNESS IN MOUTH AND THROAT WITH OR WITHOUT PRESENCE OF ULCERS °· *URINARY PROBLEMS °· *BOWEL PROBLEMS °· UNUSUAL RASH °Items with * indicate a potential emergency and should be followed up as soon as possible. ° °Feel free to call the clinic should you have any questions or concerns. The clinic phone number is [phone]. ° °Please show the CHEMO ALERT CARD at check-in to the Emergency Department and triage nurse. ° ° °

## 2018-08-07 ENCOUNTER — Inpatient Hospital Stay: Payer: Medicare Other | Attending: Oncology | Admitting: Oncology

## 2018-08-07 ENCOUNTER — Encounter: Payer: Self-pay | Admitting: Oncology

## 2018-08-07 ENCOUNTER — Inpatient Hospital Stay: Payer: Medicare Other

## 2018-08-07 ENCOUNTER — Other Ambulatory Visit: Payer: Self-pay

## 2018-08-07 ENCOUNTER — Telehealth: Payer: Self-pay | Admitting: Oncology

## 2018-08-07 VITALS — BP 155/79 | HR 68 | Temp 97.7°F | Resp 17 | Ht 72.0 in | Wt 180.8 lb

## 2018-08-07 DIAGNOSIS — C678 Malignant neoplasm of overlapping sites of bladder: Secondary | ICD-10-CM

## 2018-08-07 DIAGNOSIS — D709 Neutropenia, unspecified: Secondary | ICD-10-CM | POA: Diagnosis not present

## 2018-08-07 DIAGNOSIS — C679 Malignant neoplasm of bladder, unspecified: Secondary | ICD-10-CM | POA: Insufficient documentation

## 2018-08-07 DIAGNOSIS — Z5111 Encounter for antineoplastic chemotherapy: Secondary | ICD-10-CM | POA: Diagnosis present

## 2018-08-07 LAB — COMPREHENSIVE METABOLIC PANEL
ALK PHOS: 68 U/L (ref 38–126)
ALT: 17 U/L (ref 0–44)
AST: 16 U/L (ref 15–41)
Albumin: 3.8 g/dL (ref 3.5–5.0)
Anion gap: 10 (ref 5–15)
BUN: 23 mg/dL (ref 8–23)
CO2: 24 mmol/L (ref 22–32)
Calcium: 9.4 mg/dL (ref 8.9–10.3)
Chloride: 105 mmol/L (ref 98–111)
Creatinine, Ser: 0.94 mg/dL (ref 0.61–1.24)
GFR calc Af Amer: >60 mL/min (ref >60)
GFR calc non Af Amer: >60 mL/min (ref >60)
Glucose, Bld: 112 mg/dL — ABNORMAL HIGH (ref 70–99)
Potassium: 4.6 mmol/L (ref 3.5–5.1)
Sodium: 139 mmol/L (ref 135–145)
Total Bilirubin: 0.3 mg/dL (ref 0.3–1.2)
Total Protein: 6.8 g/dL (ref 6.5–8.1)

## 2018-08-07 LAB — MAGNESIUM: Magnesium: 1.8 mg/dL (ref 1.7–2.4)

## 2018-08-07 LAB — CBC WITH DIFFERENTIAL/PLATELET
ABS IMMATURE GRANULOCYTES: 0.04 10*3/uL (ref 0.00–0.07)
Basophils Absolute: 0.1 10*3/uL (ref 0.0–0.1)
Basophils Relative: 1 %
Eosinophils Absolute: 0.1 10*3/uL (ref 0.0–0.5)
Eosinophils Relative: 3 %
HCT: 29.9 % — ABNORMAL LOW (ref 39.0–52.0)
Hemoglobin: 10.2 g/dL — ABNORMAL LOW (ref 13.0–17.0)
IMMATURE GRANULOCYTES: 1 %
Lymphocytes Relative: 22 %
Lymphs Abs: 1.3 10*3/uL (ref 0.7–4.0)
MCH: 31.6 pg (ref 26.0–34.0)
MCHC: 34.1 g/dL (ref 30.0–36.0)
MCV: 92.6 fL (ref 80.0–100.0)
Monocytes Absolute: 0.8 10*3/uL (ref 0.1–1.0)
Monocytes Relative: 15 %
NEUTROS PCT: 58 %
Neutro Abs: 3.3 10*3/uL (ref 1.7–7.7)
Platelets: 234 10*3/uL (ref 150–400)
RBC: 3.23 MIL/uL — ABNORMAL LOW (ref 4.22–5.81)
RDW: 16.3 % — ABNORMAL HIGH (ref 11.5–15.5)
WBC: 5.6 10*3/uL (ref 4.0–10.5)
nRBC: 0 % (ref 0.0–0.2)

## 2018-08-07 MED ORDER — SODIUM CHLORIDE 0.9% FLUSH
10.0000 mL | INTRAVENOUS | Status: DC | PRN
  Administered 2018-08-07: 10 mL
  Filled 2018-08-07: qty 10

## 2018-08-07 MED ORDER — PALONOSETRON HCL INJECTION 0.25 MG/5ML
INTRAVENOUS | Status: AC
  Filled 2018-08-07: qty 5

## 2018-08-07 MED ORDER — SODIUM CHLORIDE 0.9 % IV SOLN
Freq: Once | INTRAVENOUS | Status: AC
  Administered 2018-08-07: 13:00:00 via INTRAVENOUS
  Filled 2018-08-07: qty 5

## 2018-08-07 MED ORDER — SODIUM CHLORIDE 0.9 % IV SOLN
2000.0000 mg | Freq: Once | INTRAVENOUS | Status: AC
  Administered 2018-08-07: 2000 mg via INTRAVENOUS
  Filled 2018-08-07: qty 52.6

## 2018-08-07 MED ORDER — SODIUM CHLORIDE 0.9% FLUSH
10.0000 mL | Freq: Once | INTRAVENOUS | Status: AC
  Administered 2018-08-07: 10 mL
  Filled 2018-08-07: qty 10

## 2018-08-07 MED ORDER — POTASSIUM CHLORIDE 2 MEQ/ML IV SOLN
Freq: Once | INTRAVENOUS | Status: AC
  Administered 2018-08-07: 11:00:00 via INTRAVENOUS
  Filled 2018-08-07: qty 10

## 2018-08-07 MED ORDER — SODIUM CHLORIDE 0.9 % IV SOLN
70.0000 mg/m2 | Freq: Once | INTRAVENOUS | Status: AC
  Administered 2018-08-07: 144 mg via INTRAVENOUS
  Filled 2018-08-07: qty 144

## 2018-08-07 MED ORDER — PALONOSETRON HCL INJECTION 0.25 MG/5ML
0.2500 mg | Freq: Once | INTRAVENOUS | Status: AC
  Administered 2018-08-07: 0.25 mg via INTRAVENOUS

## 2018-08-07 MED ORDER — HEPARIN SOD (PORK) LOCK FLUSH 100 UNIT/ML IV SOLN
500.0000 [IU] | Freq: Once | INTRAVENOUS | Status: AC | PRN
  Administered 2018-08-07: 500 [IU]
  Filled 2018-08-07: qty 5

## 2018-08-07 MED ORDER — SODIUM CHLORIDE 0.9 % IV SOLN
Freq: Once | INTRAVENOUS | Status: AC
  Administered 2018-08-07: 12:00:00 via INTRAVENOUS
  Filled 2018-08-07: qty 250

## 2018-08-07 NOTE — Progress Notes (Signed)
Hematology and Oncology Follow Up Visit  Sergio Pennington [id number] [birth year] 68 y.o. 08/07/2018 10:04 AM [name], [name], MDCopland, [name], MD   Principle Diagnosis: 68 year old with stage IV bladder cancer presented with lymphadenopathy in January 2020.    Prior Therapy:  Status post TURBT on January 28 of 2020 showed a large tumor with the pathology showed high-grade urothelial carcinoma.  Current therapy: Chemotherapy utilizing cisplatin and gemcitabine on day 1 and gemcitabine on day 8 out of a 21-day cycle.  He is here for day 1 of cycle 3.  Interim History: [name] presents today for a repeat evaluation.  Since the last visit, he tolerated chemotherapy without any major concerns.  He denies any nausea, vomiting or infusion related complications.  He is reporting improvement in his frequency and urgency.  He denies any flank pain or discomfort.  He denies any fevers, arthralgias or myalgias.  Continues to perform activities of daily living without any decline.  Patient denied headaches, blurry vision, syncope or seizures.  Denies any fevers, chills or sweats.  Denied chest pain, palpitation, orthopnea or leg edema.  Denied cough, wheezing or hemoptysis.  Denied nausea, vomiting or abdominal pain.  Denies any constipation or diarrhea.  Denies any frequency urgency or hesitancy.  Denies any arthralgias or myalgias.  Denies any skin rashes or lesions.  Denies any bleeding or clotting tendency.  Denies any easy bruising.  Denies any hair or nail changes.  Denies any anxiety or depression.  Remaining review of system is negative.           Medications: I have reviewed the patient's current medications.  Current Outpatient Medications  Medication Sig Dispense Refill  . Acetaminophen (TYLENOL PO) Take by mouth every 6 (six) hours as needed.    . Cholecalciferol (VITAMIN D3) 125 MCG (5000 UT) CAPS Take 5,000 Units by mouth daily.    . Cyanocobalamin  (VITAMIN B-12) 5000 MCG TBDP Take 5,000 mcg by mouth daily.     . Digestive Enzymes (DIGESTIVE ENZYME PO) Take 1 capsule by mouth 2 (two) times daily.     . diphenhydrAMINE (BENADRYL) 25 MG tablet Take 25 mg by mouth at bedtime.    . Emollient (CERAVE) CREA Apply 1 application topically 2 (two) times daily.    Marland Kitchen HYDROcodone-acetaminophen (NORCO/VICODIN) 5-325 MG tablet Take 1 tablet by mouth every 4 (four) hours as needed for moderate pain. 12 tablet 0  . lidocaine-prilocaine (EMLA) cream Apply 1 application topically as needed. 30 g 0  . Menaquinone-7 (VITAMIN K2 PO) Take 50 mcg by mouth daily.     . metFORMIN (GLUCOPHAGE) 500 MG tablet TAKE 1 TABLET (500 MG TOTAL) BY MOUTH 2 (TWO) TIMES DAILY WITH A MEAL. 30 tablet 0  . OVER THE COUNTER MEDICATION Take 240 mLs by mouth daily. Asca Liquid Supplement    . OVER THE COUNTER MEDICATION 1,000 mg 2 (two) times daily. Graviola    . OVER THE COUNTER MEDICATION CTFO super fruit chew    . OVER THE COUNTER MEDICATION Alive Men's 50+ gummy    . OVER THE COUNTER MEDICATION Elderberry syrup with turmeric    . prochlorperazine (COMPAZINE) 10 MG tablet Take 1 tablet (10 mg total) by mouth every 6 (six) hours as needed for nausea or vomiting. 30 tablet 0  . Sennosides-Docusate Sodium (STOOL SOFTENER & LAXATIVE PO) Take by mouth 2 (two) times daily.    . tamsulosin (FLOMAX) 0.4 MG CAPS capsule Take 0.4 mg by mouth daily.    Marland Kitchen  tolnaftate (TINACTIN) 1 % spray Apply 1 spray topically daily as needed (for feet itching).    . triamcinolone cream (KENALOG) 0.1 % APPLY TO AFFECTED AREA TWICE A DAY (Patient taking differently: Apply 1 application topically 2 (two) times daily as needed (for itching). ) 80 g 1  . UNABLE TO FIND 2 (two) times daily as needed (for constipation). Triphala    . UNABLE TO FIND 1,000 mg 3 (three) times daily. Med Name:[facility]    . Zinc 30 MG TABS Take by mouth daily.     No current facility-administered medications for this visit.       Allergies:  Allergies  Allergen Reactions  . Sulfa Antibiotics Hives    Past Medical History, Surgical history, Social history, and Family History were reviewed and updated.    Physical Exam:   ECOG: 0   General appearance: Comfortable appearing without any discomfort Head: Normocephalic without any trauma Oropharynx: Mucous membranes are moist and pink without any thrush or ulcers. Eyes: Pupils are equal and round reactive to light. Lymph nodes: No cervical, supraclavicular, inguinal or axillary lymphadenopathy.   Heart:regular rate and rhythm.  S1 and S2 without leg edema. Lung: Clear without any rhonchi or wheezes.  No dullness to percussion. Abdomin: Soft, nontender, nondistended with good bowel sounds.  No hepatosplenomegaly. Musculoskeletal: No joint deformity or effusion.  Full range of motion noted. Neurological: No deficits noted on motor, sensory and deep tendon reflex exam. Skin: No petechial rash or dryness.  Appeared moist.       Lab Results: Lab Results  Component Value Date   WBC 5.6 08/07/2018   HGB 10.2 (L) 08/07/2018   HCT 29.9 (L) 08/07/2018   MCV 92.6 08/07/2018   PLT 234 08/07/2018     Chemistry      Component Value Date/Time   NA 137 07/24/2018 0815   K 4.4 07/24/2018 0815   CL 99 07/24/2018 0815   CO2 27 07/24/2018 0815   BUN 22 07/24/2018 0815   CREATININE 1.24 07/24/2018 0815   CREATININE 1.05 01/17/2015 1559      Component Value Date/Time   CALCIUM 9.0 07/24/2018 0815   ALKPHOS 76 07/24/2018 0815   AST 27 07/24/2018 0815   ALT 40 07/24/2018 0815   BILITOT 0.3 07/24/2018 0815       Impression and Plan:  68 year old man with:  1.  Stage IV high-grade urothelial carcinoma of the with adenopathy.  This was documented in January 2020.   He remains on systemic chemotherapy without any recent issues or complications.  Risks and benefits of continuing this treatment at this time including nausea, fatigue or  myelosuppression.  After discussion is agreeable to proceed and tentatively plan is to proceed with 6 cycles.  If he achieves a complete response outside of his bladder, radical cystectomy will be considered.  2.  IV access: Port-A-Cath is in place without any concerns or issues.  3.  Antiemetics: Antiemetics are available to him without any recent nausea or vomiting.   4.  Renal function surveillance: Creatinine remains at baseline without any other concerns.  5.  Scrotal irritation: Condom catheter will be used for each cycle of chemotherapy.  5.  Goals of care: Aggressive therapy is warranted although his disease may not be curable.  6.  Neutropenia: His absolute neutrophil count is adequate today and will consider adding growth factor support after day 8 of the current cycle.  7.  Follow-up: We will be in 1 week for day 8  of the current cycle of therapy.  He will return in 3 weeks for cycle 4.  25  minutes was spent with the patient face-to-face today.  More than 50% of time was dedicated to reviewing the natural course of his disease, treatment options, complications related to therapy and addressing questions regarding future care.    Zola Button, MD 3/12/202010:04 AM

## 2018-08-07 NOTE — Patient Instructions (Signed)
[facility] Discharge Instructions for Patients Receiving Chemotherapy  Today you received the following chemotherapy agents Gemzar and Cisplatin  To help prevent nausea and vomiting after your treatment, we encourage you to take your nausea medication as directed   If you develop nausea and vomiting that is not controlled by your nausea medication, call the clinic.   BELOW ARE SYMPTOMS THAT SHOULD BE REPORTED IMMEDIATELY:  *FEVER GREATER THAN 100.5 F  *CHILLS WITH OR WITHOUT FEVER  NAUSEA AND VOMITING THAT IS NOT CONTROLLED WITH YOUR NAUSEA MEDICATION  *UNUSUAL SHORTNESS OF BREATH  *UNUSUAL BRUISING OR BLEEDING  TENDERNESS IN MOUTH AND THROAT WITH OR WITHOUT PRESENCE OF ULCERS  *URINARY PROBLEMS  *BOWEL PROBLEMS  UNUSUAL RASH Items with * indicate a potential emergency and should be followed up as soon as possible.  Feel free to call the clinic should you have any questions or concerns. The clinic phone number is [phone].  Please show the CHEMO ALERT CARD at check-in to the Emergency Department and triage nurse.   

## 2018-08-07 NOTE — Telephone Encounter (Signed)
Scheduled appt per 3/12 sch message - pt to get an updated schedule next visit.

## 2018-08-08 ENCOUNTER — Telehealth: Payer: Self-pay | Admitting: Oncology

## 2018-08-08 NOTE — Telephone Encounter (Signed)
No 3/12 los °

## 2018-08-14 ENCOUNTER — Other Ambulatory Visit: Payer: Self-pay | Admitting: Oncology

## 2018-08-14 ENCOUNTER — Inpatient Hospital Stay: Payer: Medicare Other

## 2018-08-14 ENCOUNTER — Other Ambulatory Visit: Payer: Self-pay

## 2018-08-14 ENCOUNTER — Other Ambulatory Visit: Payer: Self-pay | Admitting: Family Medicine

## 2018-08-14 VITALS — BP 152/74 | HR 62 | Temp 98.9°F | Resp 18

## 2018-08-14 DIAGNOSIS — Z95828 Presence of other vascular implants and grafts: Secondary | ICD-10-CM

## 2018-08-14 DIAGNOSIS — C678 Malignant neoplasm of overlapping sites of bladder: Secondary | ICD-10-CM

## 2018-08-14 DIAGNOSIS — E119 Type 2 diabetes mellitus without complications: Secondary | ICD-10-CM

## 2018-08-14 DIAGNOSIS — Z5111 Encounter for antineoplastic chemotherapy: Secondary | ICD-10-CM | POA: Diagnosis not present

## 2018-08-14 LAB — CMP (CANCER CENTER ONLY)
ALT: 42 U/L (ref 0–44)
ANION GAP: 13 (ref 5–15)
AST: 27 U/L (ref 15–41)
Albumin: 3.9 g/dL (ref 3.5–5.0)
Alkaline Phosphatase: 63 U/L (ref 38–126)
BUN: 33 mg/dL — ABNORMAL HIGH (ref 8–23)
CO2: 24 mmol/L (ref 22–32)
Calcium: 9.2 mg/dL (ref 8.9–10.3)
Chloride: 101 mmol/L (ref 98–111)
Creatinine: 1.35 mg/dL — ABNORMAL HIGH (ref 0.61–1.24)
GFR, Est AFR Am: >60 mL/min (ref >60)
GFR, Estimated: 53 mL/min — ABNORMAL LOW (ref >60)
Glucose, Bld: 168 mg/dL — ABNORMAL HIGH (ref 70–99)
Potassium: 4.6 mmol/L (ref 3.5–5.1)
Sodium: 138 mmol/L (ref 135–145)
Total Bilirubin: 0.4 mg/dL (ref 0.3–1.2)
Total Protein: 6.7 g/dL (ref 6.5–8.1)

## 2018-08-14 LAB — CBC WITH DIFFERENTIAL (CANCER CENTER ONLY)
Abs Immature Granulocytes: 0.05 10*3/uL (ref 0.00–0.07)
Basophils Absolute: 0.1 10*3/uL (ref 0.0–0.1)
Basophils Relative: 3 %
Eosinophils Absolute: 0 10*3/uL (ref 0.0–0.5)
Eosinophils Relative: 1 %
HCT: 28.8 % — ABNORMAL LOW (ref 39.0–52.0)
HEMOGLOBIN: 9.8 g/dL — AB (ref 13.0–17.0)
Immature Granulocytes: 2 %
Lymphocytes Relative: 36 %
Lymphs Abs: 1.1 10*3/uL (ref 0.7–4.0)
MCH: 31.4 pg (ref 26.0–34.0)
MCHC: 34 g/dL (ref 30.0–36.0)
MCV: 92.3 fL (ref 80.0–100.0)
Monocytes Absolute: 0.2 10*3/uL (ref 0.1–1.0)
Monocytes Relative: 7 %
Neutro Abs: 1.6 10*3/uL — ABNORMAL LOW (ref 1.7–7.7)
Neutrophils Relative %: 51 %
Platelet Count: 301 10*3/uL (ref 150–400)
RBC: 3.12 MIL/uL — ABNORMAL LOW (ref 4.22–5.81)
RDW: 16 % — ABNORMAL HIGH (ref 11.5–15.5)
WBC Count: 3.1 10*3/uL — ABNORMAL LOW (ref 4.0–10.5)
nRBC: 0 % (ref 0.0–0.2)

## 2018-08-14 LAB — MAGNESIUM: Magnesium: 1.1 mg/dL — CL (ref 1.7–2.4)

## 2018-08-14 MED ORDER — SODIUM CHLORIDE 0.9% FLUSH
10.0000 mL | Freq: Once | INTRAVENOUS | Status: AC
  Administered 2018-08-14: 10 mL
  Filled 2018-08-14: qty 10

## 2018-08-14 MED ORDER — SODIUM CHLORIDE 0.9 % IV SOLN
Freq: Once | INTRAVENOUS | Status: AC
  Administered 2018-08-14: 09:00:00 via INTRAVENOUS
  Filled 2018-08-14: qty 250

## 2018-08-14 MED ORDER — SODIUM CHLORIDE 0.9 % IV SOLN
2000.0000 mg | Freq: Once | INTRAVENOUS | Status: AC
  Administered 2018-08-14: 2000 mg via INTRAVENOUS
  Filled 2018-08-14: qty 52.6

## 2018-08-14 MED ORDER — HEPARIN SOD (PORK) LOCK FLUSH 100 UNIT/ML IV SOLN
500.0000 [IU] | Freq: Once | INTRAVENOUS | Status: AC | PRN
  Administered 2018-08-14: 500 [IU]
  Filled 2018-08-14: qty 5

## 2018-08-14 MED ORDER — MAGNESIUM OXIDE 400 (241.3 MG) MG PO TABS: 400.0000 mg | ORAL_TABLET | Freq: Every day | ORAL | 0 refills | Status: DC

## 2018-08-14 MED ORDER — SODIUM CHLORIDE 0.9% FLUSH
10.0000 mL | INTRAVENOUS | Status: DC | PRN
  Administered 2018-08-14: 10 mL
  Filled 2018-08-14: qty 10

## 2018-08-14 MED ORDER — PROCHLORPERAZINE MALEATE 10 MG PO TABS
ORAL_TABLET | ORAL | Status: AC
  Filled 2018-08-14: qty 1

## 2018-08-14 MED ORDER — PROCHLORPERAZINE MALEATE 10 MG PO TABS: 10.0000 mg | ORAL_TABLET | Freq: Once | ORAL | Status: DC

## 2018-08-14 MED ORDER — SODIUM CHLORIDE 0.9 % IV SOLN
Freq: Once | INTRAVENOUS | Status: DC
  Filled 2018-08-14: qty 250

## 2018-08-14 NOTE — Patient Instructions (Signed)
[facility] °Discharge Instructions for Patients Receiving Chemotherapy ° °Today you received the following chemotherapy agents Gemzar ° °To help prevent nausea and vomiting after your treatment, we encourage you to take your nausea medication as directed. °  °If you develop nausea and vomiting that is not controlled by your nausea medication, call the clinic.  ° °BELOW ARE SYMPTOMS THAT SHOULD BE REPORTED IMMEDIATELY: °· *FEVER GREATER THAN 100.5 F °· *CHILLS WITH OR WITHOUT FEVER °· NAUSEA AND VOMITING THAT IS NOT CONTROLLED WITH YOUR NAUSEA MEDICATION °· *UNUSUAL SHORTNESS OF BREATH °· *UNUSUAL BRUISING OR BLEEDING °· TENDERNESS IN MOUTH AND THROAT WITH OR WITHOUT PRESENCE OF ULCERS °· *URINARY PROBLEMS °· *BOWEL PROBLEMS °· UNUSUAL RASH °Items with * indicate a potential emergency and should be followed up as soon as possible. ° °Feel free to call the clinic should you have any questions or concerns. The clinic phone number is [phone]. ° °Please show the CHEMO ALERT CARD at check-in to the Emergency Department and triage nurse. ° ° °

## 2018-08-14 NOTE — Progress Notes (Signed)
Per Dr. Alen Blew patient is OK to treat today with today's lab results.   CRITICAL VALUE ALERT  Critical Value:  Mg+  1.1  Date & Time Notied:  0900 08/14/2018  Provider Notified: Dr. Alen Blew  Orders Received/Actions taken: Suppliment ordered

## 2018-08-14 NOTE — Progress Notes (Signed)
Made aware of patient Mag of 1.1 received verbal order for daily po Mag 400mg . Prescription escribed to requested pharmacy. Infusion RN Heidi made aware and will make patient aware of prescription to be picked up.Marland Kitchen

## 2018-08-15 ENCOUNTER — Encounter: Payer: Self-pay | Admitting: Family Medicine

## 2018-08-15 ENCOUNTER — Other Ambulatory Visit: Payer: Self-pay | Admitting: Family Medicine

## 2018-08-15 DIAGNOSIS — E119 Type 2 diabetes mellitus without complications: Secondary | ICD-10-CM

## 2018-08-15 MED ORDER — METFORMIN HCL 500 MG PO TABS: 500.0000 mg | ORAL_TABLET | Freq: Two times a day (BID) | ORAL | 0 refills | Status: DC

## 2018-08-27 ENCOUNTER — Inpatient Hospital Stay: Payer: Medicare Other | Attending: Oncology | Admitting: Oncology

## 2018-08-27 ENCOUNTER — Inpatient Hospital Stay: Payer: Medicare Other

## 2018-08-27 ENCOUNTER — Other Ambulatory Visit: Payer: Self-pay

## 2018-08-27 ENCOUNTER — Telehealth: Payer: Self-pay | Admitting: Oncology

## 2018-08-27 VITALS — BP 163/75 | HR 64 | Temp 97.7°F | Resp 17 | Ht 72.0 in | Wt 181.0 lb

## 2018-08-27 DIAGNOSIS — D709 Neutropenia, unspecified: Secondary | ICD-10-CM | POA: Diagnosis not present

## 2018-08-27 DIAGNOSIS — C678 Malignant neoplasm of overlapping sites of bladder: Secondary | ICD-10-CM

## 2018-08-27 DIAGNOSIS — Z5111 Encounter for antineoplastic chemotherapy: Secondary | ICD-10-CM | POA: Diagnosis not present

## 2018-08-27 DIAGNOSIS — C679 Malignant neoplasm of bladder, unspecified: Secondary | ICD-10-CM | POA: Diagnosis present

## 2018-08-27 DIAGNOSIS — D6481 Anemia due to antineoplastic chemotherapy: Secondary | ICD-10-CM | POA: Diagnosis not present

## 2018-08-27 LAB — CMP (CANCER CENTER ONLY)
ALT: 21 U/L (ref 0–44)
AST: 18 U/L (ref 15–41)
Albumin: 3.9 g/dL (ref 3.5–5.0)
Alkaline Phosphatase: 60 U/L (ref 38–126)
Anion gap: 10 (ref 5–15)
BUN: 28 mg/dL — ABNORMAL HIGH (ref 8–23)
CO2: 27 mmol/L (ref 22–32)
Calcium: 9.6 mg/dL (ref 8.9–10.3)
Chloride: 101 mmol/L (ref 98–111)
Creatinine: 1.27 mg/dL — ABNORMAL HIGH (ref 0.61–1.24)
GFR, Est AFR Am: >60 mL/min (ref >60)
GFR, Estimated: 57 mL/min — ABNORMAL LOW (ref >60)
Glucose, Bld: 152 mg/dL — ABNORMAL HIGH (ref 70–99)
Potassium: 4.7 mmol/L (ref 3.5–5.1)
Sodium: 138 mmol/L (ref 135–145)
Total Bilirubin: 0.4 mg/dL (ref 0.3–1.2)
Total Protein: 6.7 g/dL (ref 6.5–8.1)

## 2018-08-27 LAB — CBC WITH DIFFERENTIAL (CANCER CENTER ONLY)
Abs Immature Granulocytes: 0.02 10*3/uL (ref 0.00–0.07)
Basophils Absolute: 0 10*3/uL (ref 0.0–0.1)
Basophils Relative: 1 %
Eosinophils Absolute: 0.1 10*3/uL (ref 0.0–0.5)
Eosinophils Relative: 2 %
HCT: 28.6 % — ABNORMAL LOW (ref 39.0–52.0)
Hemoglobin: 9.7 g/dL — ABNORMAL LOW (ref 13.0–17.0)
Immature Granulocytes: 0 %
Lymphocytes Relative: 25 %
Lymphs Abs: 1.1 10*3/uL (ref 0.7–4.0)
MCH: 32.2 pg (ref 26.0–34.0)
MCHC: 33.9 g/dL (ref 30.0–36.0)
MCV: 95 fL (ref 80.0–100.0)
Monocytes Absolute: 0.7 10*3/uL (ref 0.1–1.0)
Monocytes Relative: 15 %
Neutro Abs: 2.6 10*3/uL (ref 1.7–7.7)
Neutrophils Relative %: 57 %
Platelet Count: 167 10*3/uL (ref 150–400)
RBC: 3.01 MIL/uL — ABNORMAL LOW (ref 4.22–5.81)
RDW: 17.5 % — ABNORMAL HIGH (ref 11.5–15.5)
WBC Count: 4.5 10*3/uL (ref 4.0–10.5)
nRBC: 0 % (ref 0.0–0.2)

## 2018-08-27 NOTE — Telephone Encounter (Signed)
Per 4/1 los no los.  (sent scheduled message, appts scheduled already)

## 2018-08-27 NOTE — Progress Notes (Signed)
Hematology and Oncology Follow Up Visit  Sergio Pennington [id number] 1950-09-10 68 y.o. 08/27/2018 7:49 AM [name], Sergio Pennington, MDCopland, Sergio Filler, MD   Principle Diagnosis: 68 year old man with bladder cancer diagnosed in January 2020.  He presented with stage IV disease and pelvic lymphadenopathy.    Prior Therapy:  Status post TURBT on January 28 of 2020 showed a large tumor with the pathology showed high-grade urothelial carcinoma.  Current therapy: Chemotherapy utilizing cisplatin and gemcitabine on day 1 and gemcitabine on day 8 out of a 21-day cycle.  He is here for day 1 of cycle 4.  Interim History: [name] returns today for a repeat evaluation.  Since the last visit, he completed cycle 3 of chemotherapy without any major complications.  He denies any nausea, vomiting, fatigue or tiredness.  He remains active and attends to activities of daily living.  He denies any worsening neuropathy or changes in his bowels.  His performance status and activity level remains unchanged.   He denied any alteration mental status, neuropathy, confusion or dizziness.  Denies any headaches or lethargy.  Denies any night sweats, weight loss or changes in appetite.  Denied orthopnea, dyspnea on exertion or chest discomfort.  Denies shortness of breath, difficulty breathing hemoptysis or cough.  Denies any abdominal distention, nausea, early satiety or dyspepsia.  Denies any hematuria, frequency, dysuria or nocturia.  Denies any skin irritation, dryness or rash.  Denies any ecchymosis or petechiae.  Denies any lymphadenopathy or clotting.  Denies any heat or cold intolerance.  Denies any anxiety or depression.  Remaining review of system is negative.            Medications: I have reviewed the patient's current medications.  Current Outpatient Medications  Medication Sig Dispense Refill  . Acetaminophen (TYLENOL PO) Take by mouth every 6 (six) hours as needed.    .  Cholecalciferol (VITAMIN D3) 125 MCG (5000 UT) CAPS Take 5,000 Units by mouth daily.    . Cyanocobalamin (VITAMIN B-12) 5000 MCG TBDP Take 5,000 mcg by mouth daily.     . Digestive Enzymes (DIGESTIVE ENZYME PO) Take 1 capsule by mouth 2 (two) times daily.     . diphenhydrAMINE (BENADRYL) 25 MG tablet Take 25 mg by mouth at bedtime.    . Emollient (CERAVE) CREA Apply 1 application topically 2 (two) times daily.    Marland Kitchen HYDROcodone-acetaminophen (NORCO/VICODIN) 5-325 MG tablet Take 1 tablet by mouth every 4 (four) hours as needed for moderate pain. 12 tablet 0  . lidocaine-prilocaine (EMLA) cream Apply 1 application topically as needed. 30 g 0  . magnesium oxide (MAG-OX) 400 (241.3 Mg) MG tablet Take 1 tablet (400 mg total) by mouth daily. 30 tablet 0  . Menaquinone-7 (VITAMIN K2 PO) Take 50 mcg by mouth daily.     . metFORMIN (GLUCOPHAGE) 500 MG tablet TAKE 1 TABLET (500 MG TOTAL) BY MOUTH 2 (TWO) TIMES DAILY WITH A MEAL. 30 tablet 0  . metFORMIN (GLUCOPHAGE) 500 MG tablet Take 1 tablet (500 mg total) by mouth 2 (two) times daily with a meal. 90 tablet 0  . OVER THE COUNTER MEDICATION Take 240 mLs by mouth daily. Asca Liquid Supplement    . OVER THE COUNTER MEDICATION 1,000 mg 2 (two) times daily. Graviola    . OVER THE COUNTER MEDICATION CTFO super fruit chew    . OVER THE COUNTER MEDICATION Alive Men's 50+ gummy    . OVER THE COUNTER MEDICATION Elderberry syrup with turmeric    .  prochlorperazine (COMPAZINE) 10 MG tablet Take 1 tablet (10 mg total) by mouth every 6 (six) hours as needed for nausea or vomiting. 30 tablet 0  . Sennosides-Docusate Sodium (STOOL SOFTENER & LAXATIVE PO) Take by mouth 2 (two) times daily.    . tamsulosin (FLOMAX) 0.4 MG CAPS capsule Take 0.4 mg by mouth daily.    Marland Kitchen tolnaftate (TINACTIN) 1 % spray Apply 1 spray topically daily as needed (for feet itching).    . triamcinolone cream (KENALOG) 0.1 % APPLY TO AFFECTED AREA TWICE A DAY (Patient taking differently: Apply 1  application topically 2 (two) times daily as needed (for itching). ) 80 g 1  . UNABLE TO FIND 2 (two) times daily as needed (for constipation). Triphala    . UNABLE TO FIND 1,000 mg 3 (three) times daily. Med Name:AHCC    . Zinc 30 MG TABS Take by mouth daily.     No current facility-administered medications for this visit.      Allergies:  Allergies  Allergen Reactions  . Sulfa Antibiotics Hives    Past Medical History, Surgical history, Social history, and Family History were reviewed and updated.    Physical Exam: Blood pressure (!) 163/75, pulse 64, temperature 97.7 F (36.5 C), temperature source Oral, resp. rate 17, height 6' (1.829 m), weight 181 lb (82.1 kg), SpO2 99 %.    ECOG: 0   General appearance: Alert, awake without any distress. Head: Atraumatic without abnormalities Oropharynx: Without any thrush or ulcers. Eyes: No scleral icterus. Lymph nodes: No lymphadenopathy noted in the cervical, supraclavicular, or axillary nodes Heart:regular rate and rhythm, without any murmurs or gallops.   Lung: Clear to auscultation without any rhonchi, wheezes or dullness to percussion. Abdomin: Soft, nontender without any shifting dullness or ascites. Musculoskeletal: No clubbing or cyanosis. Neurological: No motor or sensory deficits. Skin: No rashes or lesions.       Lab Results: Lab Results  Component Value Date   WBC 3.1 (L) 08/14/2018   HGB 9.8 (L) 08/14/2018   HCT 28.8 (L) 08/14/2018   MCV 92.3 08/14/2018   PLT 301 08/14/2018     Chemistry      Component Value Date/Time   NA 138 08/14/2018 0736   K 4.6 08/14/2018 0736   CL 101 08/14/2018 0736   CO2 24 08/14/2018 0736   BUN 33 (H) 08/14/2018 0736   CREATININE 1.35 (H) 08/14/2018 0736   CREATININE 1.05 01/17/2015 1559      Component Value Date/Time   CALCIUM 9.2 08/14/2018 0736   ALKPHOS 63 08/14/2018 0736   AST 27 08/14/2018 0736   ALT 42 08/14/2018 0736   BILITOT 0.4 08/14/2018 0736        Impression and Plan:  68 year old man with:  1.  Bladder cancer diagnosed in January 2020.  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   He completed 3 cycles of chemotherapy utilizing cisplatin and gemcitabine without any major complications.  Risks and benefits of continuing this therapy was reviewed today.  Potential complications including myelosuppression, peripheral neuropathy and renal insufficiency.  Plan is to complete 6 cycles of therapy and repeat imaging studies afterwards.  He is agreeable to proceed with this plan.  2.  IV access: Port-A-Cath remains in place without any issues or concerns.  3.  Antiemetics: No issues reported with nausea and vomiting at this time.   4.  Renal function surveillance: We will continue to monitor his creatinine on cisplatin therapy.  Slight increase noted and will continue to be monitored.  5.  Scrotal irritation: Manageable at this time without any incontinence.  5.  Goals of care: Aggressive therapy is warranted given his excellent performance status.  Therapy can be potentially curative at this time.  6.  Neutropenia: His absolute neutrophil count is adequate at this time.  7.  Hypomagnesemia: Related to cisplatin therapy.  He remains on replacement without any issues or concerns.  8.  Follow-up: In 1 week To complete cycle 4 of therapy.  He will return in 3 weeks for cycle 5.  25  minutes was spent with the patient face-to-face today.  More than 50% of time was spent on reviewing his laboratory data, disease status update, answering questions and addressing concerns regarding complications related to chemotherapy.    Zola Button, MD 4/1/20207:49 AM

## 2018-08-28 ENCOUNTER — Inpatient Hospital Stay: Payer: Medicare Other

## 2018-08-28 ENCOUNTER — Other Ambulatory Visit: Payer: Self-pay

## 2018-08-28 VITALS — BP 161/70 | HR 64 | Temp 97.6°F | Resp 17

## 2018-08-28 DIAGNOSIS — Z5111 Encounter for antineoplastic chemotherapy: Secondary | ICD-10-CM | POA: Diagnosis not present

## 2018-08-28 DIAGNOSIS — C678 Malignant neoplasm of overlapping sites of bladder: Secondary | ICD-10-CM

## 2018-08-28 LAB — MAGNESIUM: Magnesium: 1.7 mg/dL (ref 1.7–2.4)

## 2018-08-28 MED ORDER — POTASSIUM CHLORIDE 2 MEQ/ML IV SOLN
Freq: Once | INTRAVENOUS | Status: AC
  Administered 2018-08-28: 10:00:00 via INTRAVENOUS
  Filled 2018-08-28: qty 10

## 2018-08-28 MED ORDER — SODIUM CHLORIDE 0.9 % IV SOLN
2000.0000 mg | Freq: Once | INTRAVENOUS | Status: AC
  Administered 2018-08-28: 13:00:00 2000 mg via INTRAVENOUS
  Filled 2018-08-28: qty 52.6

## 2018-08-28 MED ORDER — SODIUM CHLORIDE 0.9% FLUSH
10.0000 mL | INTRAVENOUS | Status: DC | PRN
  Administered 2018-08-28: 17:00:00 10 mL
  Filled 2018-08-28: qty 10

## 2018-08-28 MED ORDER — PALONOSETRON HCL INJECTION 0.25 MG/5ML
0.2500 mg | Freq: Once | INTRAVENOUS | Status: AC
  Administered 2018-08-28: 0.25 mg via INTRAVENOUS

## 2018-08-28 MED ORDER — SODIUM CHLORIDE 0.9 % IV SOLN
Freq: Once | INTRAVENOUS | Status: AC
  Administered 2018-08-28: 10:00:00 via INTRAVENOUS
  Filled 2018-08-28: qty 250

## 2018-08-28 MED ORDER — HEPARIN SOD (PORK) LOCK FLUSH 100 UNIT/ML IV SOLN
500.0000 [IU] | Freq: Once | INTRAVENOUS | Status: AC | PRN
  Administered 2018-08-28: 17:00:00 500 [IU]
  Filled 2018-08-28: qty 5

## 2018-08-28 MED ORDER — PALONOSETRON HCL INJECTION 0.25 MG/5ML
INTRAVENOUS | Status: AC
  Filled 2018-08-28: qty 5

## 2018-08-28 MED ORDER — SODIUM CHLORIDE 0.9 % IV SOLN
70.0000 mg/m2 | Freq: Once | INTRAVENOUS | Status: AC
  Administered 2018-08-28: 144 mg via INTRAVENOUS
  Filled 2018-08-28: qty 144

## 2018-08-28 MED ORDER — SODIUM CHLORIDE 0.9 % IV SOLN
Freq: Once | INTRAVENOUS | Status: AC
  Administered 2018-08-28: 12:00:00 via INTRAVENOUS
  Filled 2018-08-28: qty 5

## 2018-08-28 NOTE — Patient Instructions (Signed)
[facility] Discharge Instructions for Patients Receiving Chemotherapy  Today you received the following chemotherapy agents Gemzar and Cisplatin  To help prevent nausea and vomiting after your treatment, we encourage you to take your nausea medication as directed   If you develop nausea and vomiting that is not controlled by your nausea medication, call the clinic.   BELOW ARE SYMPTOMS THAT SHOULD BE REPORTED IMMEDIATELY:  *FEVER GREATER THAN 100.5 F  *CHILLS WITH OR WITHOUT FEVER  NAUSEA AND VOMITING THAT IS NOT CONTROLLED WITH YOUR NAUSEA MEDICATION  *UNUSUAL SHORTNESS OF BREATH  *UNUSUAL BRUISING OR BLEEDING  TENDERNESS IN MOUTH AND THROAT WITH OR WITHOUT PRESENCE OF ULCERS  *URINARY PROBLEMS  *BOWEL PROBLEMS  UNUSUAL RASH Items with * indicate a potential emergency and should be followed up as soon as possible.  Feel free to call the clinic should you have any questions or concerns. The clinic phone number is [phone].  Please show the CHEMO ALERT CARD at check-in to the Emergency Department and triage nurse.   

## 2018-09-04 ENCOUNTER — Inpatient Hospital Stay: Payer: Medicare Other

## 2018-09-04 ENCOUNTER — Ambulatory Visit: Payer: Medicare Other | Admitting: Oncology

## 2018-09-04 ENCOUNTER — Other Ambulatory Visit: Payer: Self-pay

## 2018-09-04 ENCOUNTER — Ambulatory Visit: Payer: Medicare Other

## 2018-09-04 ENCOUNTER — Other Ambulatory Visit: Payer: Self-pay | Admitting: *Deleted

## 2018-09-04 VITALS — BP 173/79 | HR 57 | Temp 97.8°F | Resp 17

## 2018-09-04 DIAGNOSIS — C678 Malignant neoplasm of overlapping sites of bladder: Secondary | ICD-10-CM

## 2018-09-04 DIAGNOSIS — Z5111 Encounter for antineoplastic chemotherapy: Secondary | ICD-10-CM | POA: Diagnosis not present

## 2018-09-04 DIAGNOSIS — Z95828 Presence of other vascular implants and grafts: Secondary | ICD-10-CM

## 2018-09-04 LAB — CMP (CANCER CENTER ONLY)
ALT: 39 U/L (ref 10–47)
AST: 33 U/L (ref 11–38)
Albumin: 3.8 g/dL (ref 3.5–5.0)
Alkaline Phosphatase: 62 U/L (ref 38–126)
Anion gap: 12 (ref 5–15)
BUN: 29 mg/dL — ABNORMAL HIGH (ref 8–23)
CO2: 24 mmol/L (ref 22–32)
Calcium: 9.7 mg/dL (ref 8.9–10.3)
Chloride: 100 mmol/L (ref 98–111)
Creatinine: 1.26 mg/dL — ABNORMAL HIGH (ref 0.60–1.20)
GFR, Est AFR Am: >60 mL/min (ref >60)
GFR, Estimated: 57 mL/min — ABNORMAL LOW (ref >60)
Glucose, Bld: 160 mg/dL — ABNORMAL HIGH (ref 70–99)
Potassium: 4.4 mmol/L (ref 3.5–5.1)
Sodium: 136 mmol/L (ref 135–145)
Total Bilirubin: 0.3 mg/dL (ref 0.2–1.6)
Total Protein: 6.6 g/dL (ref 6.5–8.1)

## 2018-09-04 LAB — CBC WITH DIFFERENTIAL (CANCER CENTER ONLY)
Abs Immature Granulocytes: 0.04 10*3/uL (ref 0.00–0.07)
Basophils Absolute: 0.1 10*3/uL (ref 0.0–0.1)
Basophils Relative: 2 %
Eosinophils Absolute: 0 10*3/uL (ref 0.0–0.5)
Eosinophils Relative: 1 %
HCT: 26.2 % — ABNORMAL LOW (ref 39.0–52.0)
Hemoglobin: 9 g/dL — ABNORMAL LOW (ref 13.0–17.0)
Immature Granulocytes: 1 %
Lymphocytes Relative: 40 %
Lymphs Abs: 1.1 10*3/uL (ref 0.7–4.0)
MCH: 32.3 pg (ref 26.0–34.0)
MCHC: 34.4 g/dL (ref 30.0–36.0)
MCV: 93.9 fL (ref 80.0–100.0)
Monocytes Absolute: 0.3 10*3/uL (ref 0.1–1.0)
Monocytes Relative: 12 %
Neutro Abs: 1.2 10*3/uL — ABNORMAL LOW (ref 1.7–7.7)
Neutrophils Relative %: 44 %
Platelet Count: 240 10*3/uL (ref 150–400)
RBC: 2.79 MIL/uL — ABNORMAL LOW (ref 4.22–5.81)
RDW: 16.4 % — ABNORMAL HIGH (ref 11.5–15.5)
WBC Count: 2.8 10*3/uL — ABNORMAL LOW (ref 4.0–10.5)
nRBC: 0 % (ref 0.0–0.2)

## 2018-09-04 MED ORDER — PROMETHAZINE HCL 25 MG/ML IJ SOLN
12.5000 mg | Freq: Once | INTRAMUSCULAR | Status: AC
  Administered 2018-09-04: 12.5 mg via INTRAVENOUS

## 2018-09-04 MED ORDER — PROCHLORPERAZINE MALEATE 10 MG PO TABS
10.0000 mg | ORAL_TABLET | Freq: Once | ORAL | Status: AC
  Administered 2018-09-04: 10 mg via ORAL

## 2018-09-04 MED ORDER — PROMETHAZINE HCL 25 MG/ML IJ SOLN
INTRAMUSCULAR | Status: AC
  Filled 2018-09-04: qty 1

## 2018-09-04 MED ORDER — SODIUM CHLORIDE 0.9 % IV SOLN
Freq: Once | INTRAVENOUS | Status: AC
  Administered 2018-09-04: 09:00:00 via INTRAVENOUS
  Filled 2018-09-04: qty 250

## 2018-09-04 MED ORDER — SODIUM CHLORIDE 0.9 % IV SOLN
2000.0000 mg | Freq: Once | INTRAVENOUS | Status: AC
  Administered 2018-09-04: 2000 mg via INTRAVENOUS
  Filled 2018-09-04: qty 52.6

## 2018-09-04 MED ORDER — SODIUM CHLORIDE 0.9% FLUSH
10.0000 mL | Freq: Once | INTRAVENOUS | Status: AC
  Administered 2018-09-04: 08:00:00 10 mL
  Filled 2018-09-04: qty 10

## 2018-09-04 MED ORDER — PROCHLORPERAZINE MALEATE 10 MG PO TABS
ORAL_TABLET | ORAL | Status: AC
  Filled 2018-09-04: qty 1

## 2018-09-04 MED ORDER — HEPARIN SOD (PORK) LOCK FLUSH 100 UNIT/ML IV SOLN
500.0000 [IU] | Freq: Once | INTRAVENOUS | Status: AC | PRN
  Administered 2018-09-04: 500 [IU]
  Filled 2018-09-04: qty 5

## 2018-09-04 MED ORDER — PROCHLORPERAZINE MALEATE 10 MG PO TABS: 10.0000 mg | ORAL_TABLET | Freq: Four times a day (QID) | ORAL | 0 refills | Status: DC | PRN

## 2018-09-04 MED ORDER — SODIUM CHLORIDE 0.9% FLUSH
10.0000 mL | INTRAVENOUS | Status: DC | PRN
  Administered 2018-09-04: 10 mL
  Filled 2018-09-04: qty 10

## 2018-09-04 NOTE — Patient Instructions (Signed)
[facility] °Discharge Instructions for Patients Receiving Chemotherapy ° °Today you received the following chemotherapy agents Gemzar ° °To help prevent nausea and vomiting after your treatment, we encourage you to take your nausea medication as directed. °  °If you develop nausea and vomiting that is not controlled by your nausea medication, call the clinic.  ° °BELOW ARE SYMPTOMS THAT SHOULD BE REPORTED IMMEDIATELY: °· *FEVER GREATER THAN 100.5 F °· *CHILLS WITH OR WITHOUT FEVER °· NAUSEA AND VOMITING THAT IS NOT CONTROLLED WITH YOUR NAUSEA MEDICATION °· *UNUSUAL SHORTNESS OF BREATH °· *UNUSUAL BRUISING OR BLEEDING °· TENDERNESS IN MOUTH AND THROAT WITH OR WITHOUT PRESENCE OF ULCERS °· *URINARY PROBLEMS °· *BOWEL PROBLEMS °· UNUSUAL RASH °Items with * indicate a potential emergency and should be followed up as soon as possible. ° °Feel free to call the clinic should you have any questions or concerns. The clinic phone number is [phone]. ° °Please show the CHEMO ALERT CARD at check-in to the Emergency Department and triage nurse. ° ° °

## 2018-09-04 NOTE — Progress Notes (Signed)
Reviewed pt labs (CBC) with Dr. Alen Blew and pt ok to treat with ANC 1.2

## 2018-09-05 ENCOUNTER — Encounter: Payer: Self-pay | Admitting: Oncology

## 2018-09-06 ENCOUNTER — Other Ambulatory Visit: Payer: Self-pay | Admitting: Oncology

## 2018-09-08 ENCOUNTER — Telehealth: Payer: Self-pay | Admitting: *Deleted

## 2018-09-08 NOTE — Telephone Encounter (Signed)
My chart message, called patient to discuss schedule.  No concerns.

## 2018-09-17 ENCOUNTER — Other Ambulatory Visit: Payer: Self-pay

## 2018-09-17 ENCOUNTER — Inpatient Hospital Stay: Payer: Medicare Other

## 2018-09-17 ENCOUNTER — Inpatient Hospital Stay (HOSPITAL_BASED_OUTPATIENT_CLINIC_OR_DEPARTMENT_OTHER): Payer: Medicare Other | Admitting: Oncology

## 2018-09-17 VITALS — BP 143/60 | HR 63 | Temp 98.0°F | Resp 17 | Ht 72.0 in | Wt 177.9 lb

## 2018-09-17 DIAGNOSIS — D709 Neutropenia, unspecified: Secondary | ICD-10-CM | POA: Diagnosis not present

## 2018-09-17 DIAGNOSIS — C679 Malignant neoplasm of bladder, unspecified: Secondary | ICD-10-CM

## 2018-09-17 DIAGNOSIS — D6481 Anemia due to antineoplastic chemotherapy: Secondary | ICD-10-CM

## 2018-09-17 DIAGNOSIS — C678 Malignant neoplasm of overlapping sites of bladder: Secondary | ICD-10-CM

## 2018-09-17 DIAGNOSIS — Z5111 Encounter for antineoplastic chemotherapy: Secondary | ICD-10-CM | POA: Diagnosis not present

## 2018-09-17 LAB — CMP (CANCER CENTER ONLY)
ALT: 14 U/L (ref 0–44)
AST: 16 U/L (ref 15–41)
Albumin: 4 g/dL (ref 3.5–5.0)
Alkaline Phosphatase: 66 U/L (ref 38–126)
Anion gap: 8 (ref 5–15)
BUN: 33 mg/dL — ABNORMAL HIGH (ref 8–23)
CO2: 26 mmol/L (ref 22–32)
Calcium: 8.9 mg/dL (ref 8.9–10.3)
Chloride: 103 mmol/L (ref 98–111)
Creatinine: 1.33 mg/dL — ABNORMAL HIGH (ref 0.61–1.24)
GFR, Est AFR Am: >60 mL/min (ref >60)
GFR, Estimated: 54 mL/min — ABNORMAL LOW (ref >60)
Glucose, Bld: 123 mg/dL — ABNORMAL HIGH (ref 70–99)
Potassium: 4.8 mmol/L (ref 3.5–5.1)
Sodium: 137 mmol/L (ref 135–145)
Total Bilirubin: 0.3 mg/dL (ref 0.3–1.2)
Total Protein: 6.8 g/dL (ref 6.5–8.1)

## 2018-09-17 LAB — CBC WITH DIFFERENTIAL (CANCER CENTER ONLY)
Abs Immature Granulocytes: 0.05 10*3/uL (ref 0.00–0.07)
Basophils Absolute: 0 10*3/uL (ref 0.0–0.1)
Basophils Relative: 1 %
Eosinophils Absolute: 0.1 10*3/uL (ref 0.0–0.5)
Eosinophils Relative: 2 %
HCT: 24.3 % — ABNORMAL LOW (ref 39.0–52.0)
Hemoglobin: 8.2 g/dL — ABNORMAL LOW (ref 13.0–17.0)
Immature Granulocytes: 1 %
Lymphocytes Relative: 28 %
Lymphs Abs: 1.2 10*3/uL (ref 0.7–4.0)
MCH: 33.2 pg (ref 26.0–34.0)
MCHC: 33.7 g/dL (ref 30.0–36.0)
MCV: 98.4 fL (ref 80.0–100.0)
Monocytes Absolute: 0.7 10*3/uL (ref 0.1–1.0)
Monocytes Relative: 18 %
Neutro Abs: 2.1 10*3/uL (ref 1.7–7.7)
Neutrophils Relative %: 50 %
Platelet Count: 150 10*3/uL (ref 150–400)
RBC: 2.47 MIL/uL — ABNORMAL LOW (ref 4.22–5.81)
RDW: 18.1 % — ABNORMAL HIGH (ref 11.5–15.5)
WBC Count: 4.1 10*3/uL (ref 4.0–10.5)
nRBC: 1.2 % — ABNORMAL HIGH (ref 0.0–0.2)

## 2018-09-17 LAB — MAGNESIUM: Magnesium: 1.8 mg/dL (ref 1.7–2.4)

## 2018-09-17 NOTE — Progress Notes (Signed)
Hematology and Oncology Follow Up Visit  Sergio Pennington [id number] [birth year] 68 y.o. 09/17/2018 2:52 PM [name], [name], MDCopland, [name], MD   Principle Diagnosis: 68 year old man with stage IV bladder cancer with pelvic adenopathy diagnosed in January 2020.     Prior Therapy:  Status post TURBT on January 28 of 2020 showed a large tumor with the pathology showed high-grade urothelial carcinoma.  Current therapy: Chemotherapy utilizing cisplatin and gemcitabine on day 1 and gemcitabine on day 8 out of a 21-day cycle.  He is here for day 1 of cycle 5.  Interim History: Sergio Pennington is here for a follow-up visit.  Since last visit, he reports no major changes in his health.  He tolerated the last cycle of chemotherapy with few complaints.  He is reporting more fatigue and tiredness but no infusion related complications.  He did have one episode of nausea and vomiting associated with gemcitabine but has resolved.  He is eating better although his appetite overall down and weight is down.  Does report some fatigue and dizziness at times but no syncope or falls.  He is able to drive and attends to activities of daily living.   Patient denied headaches, blurry vision, or seizures.  Denies any fevers, chills or sweats.  Denied chest pain, palpitation, orthopnea or leg edema.  Denied cough, wheezing or hemoptysis.  Denied nausea, vomiting or abdominal pain.  Denies any constipation or diarrhea.  Denies any frequency urgency or hesitancy.  Denies any arthralgias or myalgias.  Denies any skin rashes or lesions.  Denies any bleeding or clotting tendency.  Denies any easy bruising.  Denies any hair or nail changes.  Denies any anxiety or depression.  Remaining review of system is negative.              Medications: I have reviewed the patient's current medications.  Current Outpatient Medications  Medication Sig Dispense Refill  . Acetaminophen (TYLENOL PO) Take  by mouth every 6 (six) hours as needed.    . Cholecalciferol (VITAMIN D3) 125 MCG (5000 UT) CAPS Take 5,000 Units by mouth daily.    . Cyanocobalamin (VITAMIN B-12) 5000 MCG TBDP Take 5,000 mcg by mouth daily.     . Digestive Enzymes (DIGESTIVE ENZYME PO) Take 1 capsule by mouth 2 (two) times daily.     . diphenhydrAMINE (BENADRYL) 25 MG tablet Take 25 mg by mouth at bedtime.    . Emollient (CERAVE) CREA Apply 1 application topically 2 (two) times daily.    Marland Kitchen HYDROcodone-acetaminophen (NORCO/VICODIN) 5-325 MG tablet Take 1 tablet by mouth every 4 (four) hours as needed for moderate pain. 12 tablet 0  . lidocaine-prilocaine (EMLA) cream Apply 1 application topically as needed. 30 g 0  . magnesium oxide (MAG-OX) 400 MG tablet TAKE 1 TABLET BY MOUTH EVERY DAY 30 tablet 0  . Menaquinone-7 (VITAMIN K2 PO) Take 50 mcg by mouth daily.     . metFORMIN (GLUCOPHAGE) 500 MG tablet TAKE 1 TABLET (500 MG TOTAL) BY MOUTH 2 (TWO) TIMES DAILY WITH A MEAL. 30 tablet 0  . metFORMIN (GLUCOPHAGE) 500 MG tablet Take 1 tablet (500 mg total) by mouth 2 (two) times daily with a meal. 90 tablet 0  . OVER THE COUNTER MEDICATION Take 240 mLs by mouth daily. Asca Liquid Supplement    . OVER THE COUNTER MEDICATION 1,000 mg 2 (two) times daily. Graviola    . OVER THE COUNTER MEDICATION CTFO super fruit chew    . OVER THE  COUNTER MEDICATION Alive Men's 50+ gummy    . OVER THE COUNTER MEDICATION Elderberry syrup with turmeric    . prochlorperazine (COMPAZINE) 10 MG tablet Take 1 tablet (10 mg total) by mouth every 6 (six) hours as needed for nausea or vomiting. 30 tablet 0  . Sennosides-Docusate Sodium (STOOL SOFTENER & LAXATIVE PO) Take by mouth 2 (two) times daily.    . tamsulosin (FLOMAX) 0.4 MG CAPS capsule Take 0.4 mg by mouth daily.    Marland Kitchen tolnaftate (TINACTIN) 1 % spray Apply 1 spray topically daily as needed (for feet itching).    . triamcinolone cream (KENALOG) 0.1 % APPLY TO AFFECTED AREA TWICE A DAY (Patient taking  differently: Apply 1 application topically 2 (two) times daily as needed (for itching). ) 80 g 1  . UNABLE TO FIND 2 (two) times daily as needed (for constipation). Triphala    . UNABLE TO FIND 1,000 mg 3 (three) times daily. Med Name:[facility]CC    . Zinc 30 MG TABS Take by mouth daily.     No current facility-administered medications for this visit.      Allergies:  Allergies  Allergen Reactions  . Sulfa Antibiotics Hives    Past Medical History, Surgical history, Social history, and Family History were reviewed and updated.    Physical Exam:  Blood pressure (!) 143/60, pulse 63, temperature 98 F (36.7 C), temperature source Oral, resp. rate 17, height 6' (1.829 m), weight 177 lb 14.4 oz (80.7 kg), SpO2 100 %.    ECOG: 0   General appearance: Comfortable appearing without any discomfort Head: Normocephalic without any trauma Oropharynx: Mucous membranes are moist and pink without any thrush or ulcers. Eyes: Pupils are equal and round reactive to light. Lymph nodes: No cervical, supraclavicular, inguinal or axillary lymphadenopathy.   Heart:regular rate and rhythm.  S1 and S2 without leg edema. Lung: Clear without any rhonchi or wheezes.  No dullness to percussion. Abdomin: Soft, nontender, nondistended with good bowel sounds.  No hepatosplenomegaly. Musculoskeletal: No joint deformity or effusion.  Full range of motion noted. Neurological: No deficits noted on motor, sensory and deep tendon reflex exam. Skin: No petechial rash or dryness.  Appeared moist.  Psychiatric: Mood and affect appeared appropriate.  .       Lab Results: Lab Results  Component Value Date   WBC 4.1 09/17/2018   HGB 8.2 (L) 09/17/2018   HCT 24.3 (L) 09/17/2018   MCV 98.4 09/17/2018   PLT 150 09/17/2018     Chemistry      Component Value Date/Time   NA 136 09/04/2018 0805   K 4.4 09/04/2018 0805   CL 100 09/04/2018 0805   CO2 24 09/04/2018 0805   BUN 29 (H) 09/04/2018 0805    CREATININE 1.26 (H) 09/04/2018 0805   CREATININE 1.05 01/17/2015 1559      Component Value Date/Time   CALCIUM 9.7 09/04/2018 0805   ALKPHOS 62 09/04/2018 0805   AST 33 09/04/2018 0805   ALT 39 09/04/2018 0805   BILITOT 0.3 09/04/2018 0805       Impression and Plan:  68 year old man with:  1.  Stage IV bladder cancer diagnosed in January 2020 with pelvic adenopathy.     He continues to receive neoadjuvant chemotherapy utilizing cisplatin and gemcitabine without any major complications.  Risks and benefits of continuing this therapy was discussed.  The plan is to complete 6 cycles of therapy pending his tolerance and complete imaging studies after that.  Salvage cystectomy would be  an option if he has a good response without any residual disease outside of the bladder.  He is agreeable to proceed at this time.  2.  IV access: Port-A-Cath continues to be in use without any issues.  3.  Antiemetics: Antiemetics are available to him without any recent nausea or vomiting.   4.  Renal function surveillance: Kidney function remains stable with creatinine clearance approaching 60 cc/min.  5.  Scrotal irritation: No recurrent issues at this time.  Continues to have a catheter placed for chemotherapy days.  5.  Goals of care: Therapy is potentially curative and aggressive therapy is warranted at this time.  6.  Neutropenia: Absolute neutrophil count appears adequate no growth factor support needed.  7.  Hypomagnesemia: He continues to be on magnesium supplements.  Magnesium wasting is related to cisplatin I will continue to monitor his remaining of his electrolytes.  8.  Anemia: Related to chemotherapy.  Does not require any transfusion at this time.  9.  Follow-up: We will be on 09/18/2018 to start cycle 5 and in 3 weeks for the start of cycle 6.  25  minutes was spent with the patient face-to-face today.  More than 50% of time was dedicated to reviewing his laboratory data,  disease status update and answering questions regarding future plan of care.    Zola Button, MD 4/22/20202:52 PM

## 2018-09-18 ENCOUNTER — Other Ambulatory Visit: Payer: Self-pay

## 2018-09-18 ENCOUNTER — Telehealth: Payer: Self-pay | Admitting: Oncology

## 2018-09-18 ENCOUNTER — Inpatient Hospital Stay: Payer: Medicare Other

## 2018-09-18 VITALS — BP 158/66 | HR 62 | Temp 98.2°F | Resp 17

## 2018-09-18 DIAGNOSIS — C678 Malignant neoplasm of overlapping sites of bladder: Secondary | ICD-10-CM

## 2018-09-18 DIAGNOSIS — Z5111 Encounter for antineoplastic chemotherapy: Secondary | ICD-10-CM | POA: Diagnosis not present

## 2018-09-18 MED ORDER — SODIUM CHLORIDE 0.9 % IV SOLN
Freq: Once | INTRAVENOUS | Status: AC
  Administered 2018-09-18: 09:00:00 via INTRAVENOUS
  Filled 2018-09-18: qty 250

## 2018-09-18 MED ORDER — HEPARIN SOD (PORK) LOCK FLUSH 100 UNIT/ML IV SOLN
500.0000 [IU] | Freq: Once | INTRAVENOUS | Status: AC | PRN
  Administered 2018-09-18: 500 [IU]
  Filled 2018-09-18: qty 5

## 2018-09-18 MED ORDER — SODIUM CHLORIDE 0.9% FLUSH
10.0000 mL | INTRAVENOUS | Status: DC | PRN
  Administered 2018-09-18: 10 mL
  Filled 2018-09-18: qty 10

## 2018-09-18 MED ORDER — SODIUM CHLORIDE 0.9 % IV SOLN
Freq: Once | INTRAVENOUS | Status: AC
  Administered 2018-09-18: 13:00:00 via INTRAVENOUS
  Filled 2018-09-18: qty 5

## 2018-09-18 MED ORDER — SODIUM CHLORIDE 0.9 % IV SOLN
70.0000 mg/m2 | Freq: Once | INTRAVENOUS | Status: AC
  Administered 2018-09-18: 144 mg via INTRAVENOUS
  Filled 2018-09-18: qty 144

## 2018-09-18 MED ORDER — PALONOSETRON HCL INJECTION 0.25 MG/5ML
0.2500 mg | Freq: Once | INTRAVENOUS | Status: AC
  Administered 2018-09-18: 0.25 mg via INTRAVENOUS

## 2018-09-18 MED ORDER — POTASSIUM CHLORIDE 2 MEQ/ML IV SOLN
Freq: Once | INTRAVENOUS | Status: AC
  Administered 2018-09-18: 10:00:00 via INTRAVENOUS
  Filled 2018-09-18: qty 1000

## 2018-09-18 MED ORDER — SODIUM CHLORIDE 0.9 % IV SOLN
2000.0000 mg | Freq: Once | INTRAVENOUS | Status: AC
  Administered 2018-09-18: 2000 mg via INTRAVENOUS
  Filled 2018-09-18: qty 52.6

## 2018-09-18 MED ORDER — PALONOSETRON HCL INJECTION 0.25 MG/5ML
INTRAVENOUS | Status: AC
  Filled 2018-09-18: qty 5

## 2018-09-18 NOTE — Telephone Encounter (Signed)
No los per 4/22. °

## 2018-09-18 NOTE — Patient Instructions (Signed)
[facility] Discharge Instructions for Patients Receiving Chemotherapy  Today you received the following chemotherapy agents Gemzar and Cisplatin  To help prevent nausea and vomiting after your treatment, we encourage you to take your nausea medication as directed   If you develop nausea and vomiting that is not controlled by your nausea medication, call the clinic.   BELOW ARE SYMPTOMS THAT SHOULD BE REPORTED IMMEDIATELY:  *FEVER GREATER THAN 100.5 F  *CHILLS WITH OR WITHOUT FEVER  NAUSEA AND VOMITING THAT IS NOT CONTROLLED WITH YOUR NAUSEA MEDICATION  *UNUSUAL SHORTNESS OF BREATH  *UNUSUAL BRUISING OR BLEEDING  TENDERNESS IN MOUTH AND THROAT WITH OR WITHOUT PRESENCE OF ULCERS  *URINARY PROBLEMS  *BOWEL PROBLEMS  UNUSUAL RASH Items with * indicate a potential emergency and should be followed up as soon as possible.  Feel free to call the clinic should you have any questions or concerns. The clinic phone number is [phone].  Please show the CHEMO ALERT CARD at check-in to the Emergency Department and triage nurse.   

## 2018-09-24 ENCOUNTER — Other Ambulatory Visit: Payer: Self-pay

## 2018-09-24 ENCOUNTER — Inpatient Hospital Stay: Payer: Medicare Other

## 2018-09-24 VITALS — BP 166/71 | HR 64 | Temp 98.3°F | Resp 20 | Ht 72.0 in | Wt 173.5 lb

## 2018-09-24 DIAGNOSIS — Z5111 Encounter for antineoplastic chemotherapy: Secondary | ICD-10-CM | POA: Diagnosis not present

## 2018-09-24 DIAGNOSIS — C678 Malignant neoplasm of overlapping sites of bladder: Secondary | ICD-10-CM

## 2018-09-24 LAB — CMP (CANCER CENTER ONLY)
ALT: 29 U/L (ref 0–44)
AST: 29 U/L (ref 15–41)
Albumin: 4.1 g/dL (ref 3.5–5.0)
Alkaline Phosphatase: 55 U/L (ref 38–126)
Anion gap: 9 (ref 5–15)
BUN: 35 mg/dL — ABNORMAL HIGH (ref 8–23)
CO2: 29 mmol/L (ref 22–32)
Calcium: 9.3 mg/dL (ref 8.9–10.3)
Chloride: 98 mmol/L (ref 98–111)
Creatinine: 1.59 mg/dL — ABNORMAL HIGH (ref 0.61–1.24)
GFR, Est AFR Am: 50 mL/min — ABNORMAL LOW (ref >60)
GFR, Estimated: 43 mL/min — ABNORMAL LOW (ref >60)
Glucose, Bld: 136 mg/dL — ABNORMAL HIGH (ref 70–99)
Potassium: 4.9 mmol/L (ref 3.5–5.1)
Sodium: 136 mmol/L (ref 135–145)
Total Bilirubin: 0.4 mg/dL (ref 0.3–1.2)
Total Protein: 6.8 g/dL (ref 6.5–8.1)

## 2018-09-24 LAB — CBC WITH DIFFERENTIAL (CANCER CENTER ONLY)
Abs Immature Granulocytes: 0.02 10*3/uL (ref 0.00–0.07)
Basophils Absolute: 0 10*3/uL (ref 0.0–0.1)
Basophils Relative: 1 %
Eosinophils Absolute: 0 10*3/uL (ref 0.0–0.5)
Eosinophils Relative: 0 %
HCT: 24.5 % — ABNORMAL LOW (ref 39.0–52.0)
Hemoglobin: 8.5 g/dL — ABNORMAL LOW (ref 13.0–17.0)
Immature Granulocytes: 1 %
Lymphocytes Relative: 21 %
Lymphs Abs: 0.6 10*3/uL — ABNORMAL LOW (ref 0.7–4.0)
MCH: 32.8 pg (ref 26.0–34.0)
MCHC: 34.7 g/dL (ref 30.0–36.0)
MCV: 94.6 fL (ref 80.0–100.0)
Monocytes Absolute: 0.2 10*3/uL (ref 0.1–1.0)
Monocytes Relative: 5 %
Neutro Abs: 2.2 10*3/uL (ref 1.7–7.7)
Neutrophils Relative %: 72 %
Platelet Count: 257 10*3/uL (ref 150–400)
RBC: 2.59 MIL/uL — ABNORMAL LOW (ref 4.22–5.81)
RDW: 17.1 % — ABNORMAL HIGH (ref 11.5–15.5)
WBC Count: 3.1 10*3/uL — ABNORMAL LOW (ref 4.0–10.5)
nRBC: 0 % (ref 0.0–0.2)

## 2018-09-24 MED ORDER — PROCHLORPERAZINE MALEATE 10 MG PO TABS: 10.0000 mg | ORAL_TABLET | Freq: Once | ORAL | Status: DC

## 2018-09-24 MED ORDER — SODIUM CHLORIDE 0.9 % IV SOLN
2000.0000 mg | Freq: Once | INTRAVENOUS | Status: AC
  Administered 2018-09-24: 2000 mg via INTRAVENOUS
  Filled 2018-09-24: qty 52.6

## 2018-09-24 MED ORDER — HEPARIN SOD (PORK) LOCK FLUSH 100 UNIT/ML IV SOLN
500.0000 [IU] | Freq: Once | INTRAVENOUS | Status: AC | PRN
  Administered 2018-09-24: 500 [IU]
  Filled 2018-09-24: qty 5

## 2018-09-24 MED ORDER — SODIUM CHLORIDE 0.9 % IV SOLN
Freq: Once | INTRAVENOUS | Status: AC
  Administered 2018-09-24: 14:00:00 via INTRAVENOUS
  Filled 2018-09-24: qty 250

## 2018-09-24 MED ORDER — PROMETHAZINE HCL 25 MG/ML IJ SOLN
12.5000 mg | Freq: Once | INTRAMUSCULAR | Status: AC
  Administered 2018-09-24: 12.5 mg via INTRAVENOUS

## 2018-09-24 MED ORDER — PROMETHAZINE HCL 25 MG/ML IJ SOLN
INTRAMUSCULAR | Status: AC
  Filled 2018-09-24: qty 1

## 2018-09-24 MED ORDER — SODIUM CHLORIDE 0.9% FLUSH
10.0000 mL | INTRAVENOUS | Status: DC | PRN
  Administered 2018-09-24: 10 mL
  Filled 2018-09-24: qty 10

## 2018-09-24 MED ORDER — PROCHLORPERAZINE MALEATE 10 MG PO TABS
ORAL_TABLET | ORAL | Status: AC
  Filled 2018-09-24: qty 1

## 2018-09-24 NOTE — Progress Notes (Signed)
Ok to treat with Gemzar with creatine level 1.59 per Dr. Alen Blew.

## 2018-09-24 NOTE — Patient Instructions (Signed)
[facility] Discharge Instructions for Patients Receiving Chemotherapy  Today you received the following chemotherapy agents: Gemzar  To help prevent nausea and vomiting after your treatment, we encourage you to take your nausea medication: As directed by MD   If you develop nausea and vomiting that is not controlled by your nausea medication, call the clinic.   BELOW ARE SYMPTOMS THAT SHOULD BE REPORTED IMMEDIATELY:  *FEVER GREATER THAN 100.5 F  *CHILLS WITH OR WITHOUT FEVER  NAUSEA AND VOMITING THAT IS NOT CONTROLLED WITH YOUR NAUSEA MEDICATION  *UNUSUAL SHORTNESS OF BREATH  *UNUSUAL BRUISING OR BLEEDING  TENDERNESS IN MOUTH AND THROAT WITH OR WITHOUT PRESENCE OF ULCERS  *URINARY PROBLEMS  *BOWEL PROBLEMS  UNUSUAL RASH Items with * indicate a potential emergency and should be followed up as soon as possible.  Feel free to call the clinic should you have any questions or concerns. The clinic phone number is [phone].  Please show the [facility] at check-in to the Emergency Department and triage nurse.

## 2018-09-25 ENCOUNTER — Other Ambulatory Visit: Payer: Self-pay | Admitting: Oncology

## 2018-09-26 ENCOUNTER — Telehealth: Payer: Self-pay

## 2018-09-26 ENCOUNTER — Other Ambulatory Visit: Payer: Self-pay | Admitting: Medical

## 2018-09-26 ENCOUNTER — Ambulatory Visit: Payer: Medicare Other

## 2018-09-26 ENCOUNTER — Other Ambulatory Visit: Payer: Self-pay | Admitting: Emergency Medicine

## 2018-09-26 ENCOUNTER — Other Ambulatory Visit: Payer: Self-pay

## 2018-09-26 ENCOUNTER — Inpatient Hospital Stay: Payer: Medicare Other

## 2018-09-26 ENCOUNTER — Inpatient Hospital Stay: Payer: Medicare Other | Attending: Oncology | Admitting: Medical

## 2018-09-26 VITALS — BP 140/56 | HR 77 | Temp 97.4°F | Resp 18 | Ht 72.0 in | Wt 171.9 lb

## 2018-09-26 DIAGNOSIS — Z95828 Presence of other vascular implants and grafts: Secondary | ICD-10-CM

## 2018-09-26 DIAGNOSIS — R112 Nausea with vomiting, unspecified: Secondary | ICD-10-CM | POA: Insufficient documentation

## 2018-09-26 DIAGNOSIS — D649 Anemia, unspecified: Secondary | ICD-10-CM | POA: Insufficient documentation

## 2018-09-26 DIAGNOSIS — K5900 Constipation, unspecified: Secondary | ICD-10-CM | POA: Diagnosis not present

## 2018-09-26 DIAGNOSIS — I951 Orthostatic hypotension: Secondary | ICD-10-CM

## 2018-09-26 DIAGNOSIS — N289 Disorder of kidney and ureter, unspecified: Secondary | ICD-10-CM | POA: Insufficient documentation

## 2018-09-26 DIAGNOSIS — R42 Dizziness and giddiness: Secondary | ICD-10-CM

## 2018-09-26 DIAGNOSIS — C678 Malignant neoplasm of overlapping sites of bladder: Secondary | ICD-10-CM | POA: Insufficient documentation

## 2018-09-26 DIAGNOSIS — Z452 Encounter for adjustment and management of vascular access device: Secondary | ICD-10-CM | POA: Diagnosis not present

## 2018-09-26 LAB — CMP (CANCER CENTER ONLY)
ALT: 29 U/L (ref 0–44)
AST: 27 U/L (ref 15–41)
Albumin: 3.9 g/dL (ref 3.5–5.0)
Alkaline Phosphatase: 55 U/L (ref 38–126)
Anion gap: 14 (ref 5–15)
BUN: 38 mg/dL — ABNORMAL HIGH (ref 8–23)
CO2: 25 mmol/L (ref 22–32)
Calcium: 9.2 mg/dL (ref 8.9–10.3)
Chloride: 100 mmol/L (ref 98–111)
Creatinine: 1.47 mg/dL — ABNORMAL HIGH (ref 0.61–1.24)
GFR, Est AFR Am: 55 mL/min — ABNORMAL LOW (ref >60)
GFR, Estimated: 48 mL/min — ABNORMAL LOW (ref >60)
Glucose, Bld: 179 mg/dL — ABNORMAL HIGH (ref 70–99)
Potassium: 4.7 mmol/L (ref 3.5–5.1)
Sodium: 139 mmol/L (ref 135–145)
Total Bilirubin: 0.7 mg/dL (ref 0.3–1.2)
Total Protein: 6.5 g/dL (ref 6.5–8.1)

## 2018-09-26 LAB — CBC WITH DIFFERENTIAL (CANCER CENTER ONLY)
Abs Immature Granulocytes: 0.03 10*3/uL (ref 0.00–0.07)
Basophils Absolute: 0 10*3/uL (ref 0.0–0.1)
Basophils Relative: 1 %
Eosinophils Absolute: 0 10*3/uL (ref 0.0–0.5)
Eosinophils Relative: 0 %
HCT: 23.2 % — ABNORMAL LOW (ref 39.0–52.0)
Hemoglobin: 8.3 g/dL — ABNORMAL LOW (ref 13.0–17.0)
Immature Granulocytes: 1 %
Lymphocytes Relative: 12 %
Lymphs Abs: 0.5 10*3/uL — ABNORMAL LOW (ref 0.7–4.0)
MCH: 33.7 pg (ref 26.0–34.0)
MCHC: 35.8 g/dL (ref 30.0–36.0)
MCV: 94.3 fL (ref 80.0–100.0)
Monocytes Absolute: 0.1 10*3/uL (ref 0.1–1.0)
Monocytes Relative: 3 %
Neutro Abs: 3.4 10*3/uL (ref 1.7–7.7)
Neutrophils Relative %: 83 %
Platelet Count: 188 10*3/uL (ref 150–400)
RBC: 2.46 MIL/uL — ABNORMAL LOW (ref 4.22–5.81)
RDW: 16.1 % — ABNORMAL HIGH (ref 11.5–15.5)
WBC Count: 4.1 10*3/uL (ref 4.0–10.5)
nRBC: 0 % (ref 0.0–0.2)

## 2018-09-26 MED ORDER — SODIUM CHLORIDE 0.9 % IV SOLN
INTRAVENOUS | Status: DC
  Administered 2018-09-26: 15:00:00 via INTRAVENOUS
  Filled 2018-09-26 (×2): qty 250

## 2018-09-26 MED ORDER — ONDANSETRON HCL 4 MG/2ML IJ SOLN
INTRAMUSCULAR | Status: AC
  Filled 2018-09-26: qty 4

## 2018-09-26 MED ORDER — LORAZEPAM 0.5 MG PO TABS: 0.5000 mg | ORAL_TABLET | Freq: Three times a day (TID) | ORAL | 0 refills | Status: DC | PRN

## 2018-09-26 MED ORDER — SODIUM CHLORIDE 0.9% FLUSH
10.0000 mL | Freq: Once | INTRAVENOUS | Status: AC
  Administered 2018-09-26: 17:00:00 10 mL
  Filled 2018-09-26: qty 10

## 2018-09-26 MED ORDER — ONDANSETRON HCL 4 MG/2ML IJ SOLN
8.0000 mg | Freq: Once | INTRAMUSCULAR | Status: AC
  Administered 2018-09-26: 17:00:00 8 mg via INTRAVENOUS

## 2018-09-26 MED ORDER — HEPARIN SOD (PORK) LOCK FLUSH 100 UNIT/ML IV SOLN
500.0000 [IU] | Freq: Once | INTRAVENOUS | Status: AC
  Administered 2018-09-26: 17:00:00 500 [IU]
  Filled 2018-09-26: qty 5

## 2018-09-26 NOTE — Telephone Encounter (Signed)
Received call from patient and spouse stating that patient has fallen 2x today. Once in the shower and a second time getting up from the first fall. HE then vomitted. Patient stated that he has some minor cuts and bruises but still feels, weak, wobbly, and dizzy. He has had no fever or any other symptoms. Stated that his appetite and fluid intake have been poor. After speaking with Sandi Mealy PA patient will be seen in [facility] after a lab appointment today. Contacted patient and made spouse aware that will receive a call from the scheduler with today's appointment time.

## 2018-09-26 NOTE — Patient Instructions (Signed)
Dehydration, Adult  Dehydration is when there is not enough fluid or water in your body. This happens when you lose more fluids than you take in. Dehydration can range from mild to very bad. It should be treated right away to keep it from getting very bad. Symptoms of mild dehydration may include:  Thirst.  Dry lips.  Slightly dry mouth.  Dry, warm skin.  Dizziness. Symptoms of moderate dehydration may include:  Very dry mouth.  Muscle cramps.  Dark pee (urine). Pee may be the color of tea.  Your body making less pee.  Your eyes making fewer tears.  Heartbeat that is uneven or faster than normal (palpitations).  Headache.  Light-headedness, especially when you stand up from sitting.  Fainting (syncope). Symptoms of very bad dehydration may include:  Changes in skin, such as: ? Cold and clammy skin. ? Blotchy (mottled) or pale skin. ? Skin that does not quickly return to normal after being lightly pinched and let go (poor skin turgor).  Changes in body fluids, such as: ? Feeling very thirsty. ? Your eyes making fewer tears. ? Not sweating when body temperature is high, such as in hot weather. ? Your body making very little pee.  Changes in vital signs, such as: ? Weak pulse. ? Pulse that is more than 100 beats a minute when you are sitting still. ? Fast breathing. ? Low blood pressure.  Other changes, such as: ? Sunken eyes. ? Cold hands and feet. ? Confusion. ? Lack of energy (lethargy). ? Trouble waking up from sleep. ? Short-term weight loss. ? Unconsciousness. Follow these instructions at home:   If told by your doctor, drink an ORS: ? Make an ORS by using instructions on the package. ? Start by drinking small amounts, about  cup (120 mL) every 5-10 minutes. ? Slowly drink more until you have had the amount that your doctor said to have.  Drink enough clear fluid to keep your pee clear or pale yellow. If you were told to drink an ORS, finish the  ORS first, then start slowly drinking clear fluids. Drink fluids such as: ? Water. Do not drink only water by itself. Doing that can make the salt (sodium) level in your body get too low (hyponatremia). ? Ice chips. ? Fruit juice that you have added water to (diluted). ? Low-calorie sports drinks.  Avoid: ? Alcohol. ? Drinks that have a lot of sugar. These include high-calorie sports drinks, fruit juice that does not have water added, and soda. ? Caffeine. ? Foods that are greasy or have a lot of fat or sugar.  Take over-the-counter and prescription medicines only as told by your doctor.  Do not take salt tablets. Doing that can make the salt level in your body get too high (hypernatremia).  Eat foods that have minerals (electrolytes). Examples include bananas, oranges, potatoes, tomatoes, and spinach.  Keep all follow-up visits as told by your doctor. This is important. Contact a doctor if:  You have belly (abdominal) pain that: ? Gets worse. ? Stays in one area (localizes).  You have a rash.  You have a stiff neck.  You get angry or annoyed more easily than normal (irritability).  You are more sleepy than normal.  You have a harder time waking up than normal.  You feel: ? Weak. ? Dizzy. ? Very thirsty.  You have peed (urinated) only a small amount of very dark pee during 6-8 hours. Get help right away if:  You have   symptoms of very bad dehydration.  You cannot drink fluids without throwing up (vomiting).  Your symptoms get worse with treatment.  You have a fever.  You have a very bad headache.  You are throwing up or having watery poop (diarrhea) and it: ? Gets worse. ? Does not go away.  You have blood or something green (bile) in your throw-up.  You have blood in your poop (stool). This may cause poop to look black and tarry.  You have not peed in 6-8 hours.  You pass out (faint).  Your heart rate when you are sitting still is more than 100 beats a  minute.  You have trouble breathing. This information is not intended to replace advice given to you by your health care provider. Make sure you discuss any questions you have with your health care provider. Document Released: 03/10/2009 Document Revised: 12/02/2015 Document Reviewed: 07/08/2015 Elsevier Interactive Patient Education  2019 Elsevier Inc.  

## 2018-09-27 NOTE — Progress Notes (Signed)
[facility] Progress Note   Sergio Pennington [id number] 1951/01/15 68 y.o. Dr. Ilean China [place] is managed by Dr. Alen Blew  Actively treated with chemotherapy/immunotherapy/hormonal therapy: yes  Current therapy: Cisplatin and gemcitabine  Last treated: 09/24/2018 (cycle 5, day 8)  Next scheduled appointment with provider: 10/08/2018  Assessment: Plan:    Orthostasis - Plan: 0.9 %  sodium chloride infusion  Non-intractable vomiting with nausea, unspecified vomiting type - Plan: ondansetron (ZOFRAN) injection 8 mg, LORazepam (ATIVAN) 0.5 MG tablet  Port-A-Cath in place - Plan: heparin lock flush 100 unit/mL, sodium chloride flush (NS) 0.9 % injection 10 mL  Malignant neoplasm of overlapping sites of bladder ([place]) - Plan: heparin lock flush 100 unit/mL, sodium chloride flush (NS) 0.9 % injection 10 mL   Orthostasis: The patient was found to be significantly orthostatic.  He was given 1 L of normal saline IV.  Nausea and vomiting the patient was given Zofran 8 mg IV x1.  He was told that he could continue using the Compazine that had been prescribed for him.  He was also given a prescription for Ativan 0.5 mg to use sublingually every 8 hours as needed.  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  He will be seen in follow-up on 10/08/2018.  Please see After Visit Summary for patient specific instructions.  Future Appointments  Date Time Provider [place]  10/08/2018  8:30 AM [facility] LAB 3 [facility] None  10/08/2018  9:00 AM Shadad, Mathis Dad, MD [facility] None  10/09/2018  8:30 AM [facility] INFUSION [facility] None  10/16/2018  8:15 AM [facility] LAB 3 [facility] None  10/16/2018  9:15 AM [facility] INFUSION [facility] None    No orders of the defined types were placed in this encounter.       Subjective:   Patient ID:  Sergio Pennington is a 68 y.o. (DOB 01-Dec-1950) male.  Chief Complaint: No chief complaint on file.   HPI Sergio Pennington is a 68 year old male with a history of a stage IV bladder cancer with pelvic adenopathy which was diagnosed in January 2020.  He is followed by Dr. Alen Blew and is status post cycle 5, day 8 of cisplatin and gemcitabine which was dosed on 09/24/2018.  He presents to the office today after having fallen in the shower earlier this morning.  He reports that his legs simply gave out on him and that he fell in the shower.  He hit his elbow and landed on his buttock.  He reports that his right elbow and buttock are sore but did not appear to be injured.  He did not hit his head.  He tried to get up and fell again as his legs gave out on him.  He did not lose consciousness.  He has been taking Compazine regularly.  Despite this he has continued to have some mild nausea.  He has had no vomiting however.  He reports that his appetite has been low but that he believes that he has continued to drink adequate volumes of liquids.  He reports having positional dizziness.  He denies fevers, chills, sweats,  vomiting, constipation, or diarrhea.  He developed nausea and vomiting towards the end of his IV hydration today.  Medications: I have reviewed the patient's current medications.  Allergies:  Allergies  Allergen Reactions  . Sulfa Antibiotics Hives    Past Medical History:  Diagnosis Date  . Bladder cancer ([place])   . Cellulitis    Left leg   . Constipation   . Diabetes mellitus without complication ([place])   . Diabetic neuropathy ([place])    Feet  . Hematuria 04/21/2018  . Hyperlipidemia   . 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 Vasculitis ([place])    Left leg    Past Surgical History:  Procedure Laterality Date  . IR IMAGING GUIDED PORT INSERTION  06/26/2018  . MOUTH SURGERY  1980  .  TONSILLECTOMY     4th grade  . TRANSURETHRAL RESECTION OF BLADDER TUMOR N/A 06/16/2018   Procedure: TRANSURETHRAL RESECTION OF BLADDER TUMOR (TURBT);  Surgeon: Lucas Mallow, MD;  Location: [facility];  Service: Urology;  Laterality: N/A;    No family history on file.  Social History   Socioeconomic History  . Marital status: Married    Spouse name: Not on file  . Number of children: Not on file  . Years of education: Not on file  . Highest education level: Not on file  Occupational History  . Not on file  Social Needs  . Financial resource strain: Not on file  . Food insecurity:    Worry: Not on file    Inability: Not on file  . Transportation needs:    Medical: Not on file    Non-medical: Not on file  Tobacco Use  . Smoking status: Former Research scientist (life sciences)  . Smokeless tobacco: Never Used  Substance and Sexual Activity  . Alcohol use: Not Currently    Alcohol/week: 1.0 standard drinks    Types: 1 Glasses of wine per week  . Drug use: Not Currently  . Sexual activity: Not on file  Lifestyle  . Physical activity:    Days per week: Not on file    Minutes per session: Not on file  . Stress: Not on file  Relationships  . Social connections:    Talks on phone: Not on file    Gets together: Not on file    Attends religious service: Not on file    Active member of club or organization: Not on file    Attends meetings of clubs or organizations: Not on file    Relationship status: Not on file  . Intimate partner violence:    Fear of current or ex partner: Not on file    Emotionally abused: Not on file    Physically abused: Not on file    Forced sexual activity: Not on file  Other Topics Concern  . Not on file  Social History Narrative  . Not on file    Past Medical History, Surgical history, Social history, and Family history were reviewed and updated as appropriate.   Please see review of systems for further details on the patient's review from today.   Review of Systems:   Review of Systems  Constitutional: Positive for appetite change. Negative for chills, diaphoresis and fever.  HENT: Negative for trouble swallowing and voice change.   Respiratory: Negative for cough, choking, chest tightness, shortness of breath and wheezing.   Cardiovascular: Negative for chest pain and palpitations.  Gastrointestinal: Positive for nausea and vomiting. Negative for abdominal pain, constipation and diarrhea.  Genitourinary: Negative for decreased urine volume.  Musculoskeletal: Negative for back pain and myalgias.  Neurological: Positive for weakness. Negative for dizziness, light-headedness and headaches.    Objective:   Physical Exam:  BP (!) 140/56 (BP Location:  Left Arm, Patient Position: Sitting)   Pulse 77   Temp (!) 97.4 F (36.3 C) (Oral)   Resp 18   Ht 6' (1.829 m)   Wt 171 lb 14.4 oz (78 kg)   SpO2 99%   BMI 23.31 kg/m  ECOG: 1  Orthostatic Blood Pressure: Blood pressure:   sitting 144/60, standing 77/37 Pulse:   sitting 64, standing 92   Physical Exam Constitutional:      General: He is not in acute distress.    Appearance: He is not diaphoretic.  HENT:     Head: Normocephalic and atraumatic.  Eyes:     General: No scleral icterus.       Right eye: No discharge.        Left eye: No discharge.     Conjunctiva/sclera: Conjunctivae normal.  Cardiovascular:     Rate and Rhythm: Normal rate and regular rhythm.     Heart sounds: Normal heart sounds. No murmur. No friction rub. No gallop.   Pulmonary:     Effort: Pulmonary effort is normal. No respiratory distress.     Breath sounds: Normal breath sounds. No wheezing or rales.  Skin:    General: Skin is warm and dry.     Findings: No erythema or rash.  Neurological:     Mental Status: He is alert.     Coordination: Coordination normal.     Gait: Gait abnormal (The patient is ambulating with the use of a wheelchair.).  Psychiatric:        Mood and Affect: Mood normal.        Behavior:  Behavior normal.        Thought Content: Thought content normal.        Judgment: Judgment normal.     Lab Review:     Component Value Date/Time   NA 139 09/26/2018 1418   K 4.7 09/26/2018 1418   CL 100 09/26/2018 1418   CO2 25 09/26/2018 1418   GLUCOSE 179 (H) 09/26/2018 1418   BUN 38 (H) 09/26/2018 1418   CREATININE 1.47 (H) 09/26/2018 1418   CREATININE 1.05 01/17/2015 1559   CALCIUM 9.2 09/26/2018 1418   PROT 6.5 09/26/2018 1418   ALBUMIN 3.9 09/26/2018 1418   AST 27 09/26/2018 1418   ALT 29 09/26/2018 1418   ALKPHOS 55 09/26/2018 1418   BILITOT 0.7 09/26/2018 1418   GFRNONAA 48 (L) 09/26/2018 1418   GFRAA 55 (L) 09/26/2018 1418       Component Value Date/Time   WBC 4.1 09/26/2018 1418   WBC 5.6 08/07/2018 0956   RBC 2.46 (L) 09/26/2018 1418   HGB 8.3 (L) 09/26/2018 1418   HCT 23.2 (L) 09/26/2018 1418   PLT 188 09/26/2018 1418   MCV 94.3 09/26/2018 1418   MCV 91.5 01/17/2015 1544   MCH 33.7 09/26/2018 1418   MCHC 35.8 09/26/2018 1418   RDW 16.1 (H) 09/26/2018 1418   LYMPHSABS 0.5 (L) 09/26/2018 1418   MONOABS 0.1 09/26/2018 1418   EOSABS 0.0 09/26/2018 1418   BASOSABS 0.0 09/26/2018 1418   -------------------------------  Imaging from last 24 hours (if applicable):  Radiology interpretation: No results found.

## 2018-09-30 ENCOUNTER — Other Ambulatory Visit: Payer: Self-pay | Admitting: Oncology

## 2018-10-01 ENCOUNTER — Telehealth: Payer: Self-pay

## 2018-10-01 NOTE — Telephone Encounter (Signed)
Nutrition Assessment   Reason for Assessment:  Patient identified on Malnutrition Screening report for weight loss and poor appetite   ASSESSMENT:   RD working remotely.  68 year old male with stage IV bladder cancer followed by Dr. Alen Blew.  Patient currently on chemotherapy.    Called patient to introduce self and service at [facility].  Spoke with wife and patient via speaker phone.  Patient reports taste change from day to day and lack of desire to eat effecting his appetite the most.  Breakfast is cottage cheese and fruit or eggs. May have a mid morning snack of apples and almond butter or pear. Lunch is phyllo with spinach and cheese lately.  Supper is chicken or steak with veggies or quinoa salad.  Wife makes him a protein drink when he will drink it with veggies, sometimes banana, almond milk, vega protein powder.  Also snacks on protein bars.    Reports some nausea but taking medication on regular basis now which has helped.  Nutrition Focused Physical Exam: deferred   Medications: reviewed   Labs: reviewed   Anthropometrics:   Height: 72 inches Weight: 171 lb 14.4 oz Noted 180s typically, last at 180 lb on 4/1 BMI: 23  5% weight loss in the last month, significant   Estimated Energy Needs  Kcals: 2300-2700 Protein: 115-135 g Fluid: 2.7 L/d   NUTRITION DIAGNOSIS: Inadequate oral intake related to cancer and cancer related treatment side effects as evidenced by 5% weight loss in the last month and taste alterations, poor appetite    INTERVENTION:  Discussed strategies to increase calories and protein.  Will email handout  Discussed strategies to help with taste changes.  Will email handout Contact information provided.  Wife and patient will reach out to RD if needed in the future.     MONITORING, EVALUATION, GOAL: Patient will consume adequate calories and protein to maintain weight   Next Visit: patient will reach out as needed  Sergio Pennington B. Zenia Resides, [place],  [place] Registered Dietitian [phone] (pager)

## 2018-10-08 ENCOUNTER — Inpatient Hospital Stay (HOSPITAL_BASED_OUTPATIENT_CLINIC_OR_DEPARTMENT_OTHER): Payer: Medicare Other | Admitting: Oncology

## 2018-10-08 ENCOUNTER — Other Ambulatory Visit: Payer: Self-pay | Admitting: Family Medicine

## 2018-10-08 ENCOUNTER — Other Ambulatory Visit: Payer: Self-pay

## 2018-10-08 ENCOUNTER — Inpatient Hospital Stay: Payer: Medicare Other

## 2018-10-08 VITALS — BP 145/62 | HR 72 | Temp 98.0°F | Resp 17 | Ht 72.0 in | Wt 175.5 lb

## 2018-10-08 DIAGNOSIS — C678 Malignant neoplasm of overlapping sites of bladder: Secondary | ICD-10-CM | POA: Diagnosis not present

## 2018-10-08 DIAGNOSIS — R112 Nausea with vomiting, unspecified: Secondary | ICD-10-CM | POA: Diagnosis not present

## 2018-10-08 DIAGNOSIS — E119 Type 2 diabetes mellitus without complications: Secondary | ICD-10-CM

## 2018-10-08 DIAGNOSIS — D649 Anemia, unspecified: Secondary | ICD-10-CM | POA: Diagnosis not present

## 2018-10-08 DIAGNOSIS — N289 Disorder of kidney and ureter, unspecified: Secondary | ICD-10-CM | POA: Diagnosis not present

## 2018-10-08 DIAGNOSIS — K5900 Constipation, unspecified: Secondary | ICD-10-CM | POA: Diagnosis not present

## 2018-10-08 LAB — CMP (CANCER CENTER ONLY)
ALT: 13 U/L (ref 0–44)
AST: 19 U/L (ref 15–41)
Albumin: 3.9 g/dL (ref 3.5–5.0)
Alkaline Phosphatase: 94 U/L (ref 38–126)
Anion gap: 11 (ref 5–15)
BUN: 35 mg/dL — ABNORMAL HIGH (ref 8–23)
CO2: 24 mmol/L (ref 22–32)
Calcium: 9.6 mg/dL (ref 8.9–10.3)
Chloride: 103 mmol/L (ref 98–111)
Creatinine: 1.58 mg/dL — ABNORMAL HIGH (ref 0.61–1.24)
GFR, Est AFR Am: 51 mL/min — ABNORMAL LOW (ref >60)
GFR, Estimated: 44 mL/min — ABNORMAL LOW (ref >60)
Glucose, Bld: 170 mg/dL — ABNORMAL HIGH (ref 70–99)
Potassium: 4.7 mmol/L (ref 3.5–5.1)
Sodium: 138 mmol/L (ref 135–145)
Total Bilirubin: 0.4 mg/dL (ref 0.3–1.2)
Total Protein: 6.6 g/dL (ref 6.5–8.1)

## 2018-10-08 LAB — CBC WITH DIFFERENTIAL (CANCER CENTER ONLY)
Abs Immature Granulocytes: 0.07 10*3/uL (ref 0.00–0.07)
Basophils Absolute: 0 10*3/uL (ref 0.0–0.1)
Basophils Relative: 0 %
Eosinophils Absolute: 0.1 10*3/uL (ref 0.0–0.5)
Eosinophils Relative: 2 %
HCT: 22.4 % — ABNORMAL LOW (ref 39.0–52.0)
Hemoglobin: 7.3 g/dL — ABNORMAL LOW (ref 13.0–17.0)
Immature Granulocytes: 2 %
Lymphocytes Relative: 23 %
Lymphs Abs: 0.9 10*3/uL (ref 0.7–4.0)
MCH: 34.1 pg — ABNORMAL HIGH (ref 26.0–34.0)
MCHC: 32.6 g/dL (ref 30.0–36.0)
MCV: 104.7 fL — ABNORMAL HIGH (ref 80.0–100.0)
Monocytes Absolute: 0.8 10*3/uL (ref 0.1–1.0)
Monocytes Relative: 19 %
Neutro Abs: 2.2 10*3/uL (ref 1.7–7.7)
Neutrophils Relative %: 54 %
Platelet Count: 181 10*3/uL (ref 150–400)
RBC: 2.14 MIL/uL — ABNORMAL LOW (ref 4.22–5.81)
RDW: 21.2 % — ABNORMAL HIGH (ref 11.5–15.5)
WBC Count: 4.1 10*3/uL (ref 4.0–10.5)
nRBC: 3 % — ABNORMAL HIGH (ref 0.0–0.2)

## 2018-10-08 LAB — MAGNESIUM: Magnesium: 1.7 mg/dL (ref 1.7–2.4)

## 2018-10-08 MED ORDER — SENNOSIDES-DOCUSATE SODIUM 8.6-50 MG PO TABS: 1.0000 | ORAL_TABLET | Freq: Two times a day (BID) | ORAL | 3 refills | Status: DC

## 2018-10-08 NOTE — Progress Notes (Signed)
Hematology and Oncology Follow Up Visit  Sergio Pennington [id number] 04/06/1951 68 y.o. 10/08/2018 8:06 AM [name], [name], MDCopland, [name], MD   Principle Diagnosis: 68 year old man with bladder cancer diagnosed in January 2020.  He presented with stage IV disease and pelvic adenopathy.     Prior Therapy:  Status post TURBT on January 28 of 2020 showed a large tumor with the pathology showed high-grade urothelial carcinoma.  Current therapy: Chemotherapy utilizing cisplatin and gemcitabine on day 1 and gemcitabine on day 8 out of a 21-day cycle.  He is status post 5 cycles of therapy and here for evaluation prior to his 6 cycle.  Interim History: Sergio Pennington returns today for a repeat evaluation.  Since the last visit, he has reported few complications related to chemotherapy including increased fatigue, weakness and nausea.  Also reported issues with constipation and no diarrhea.  He did have one syncopal episode related to hypotension which has resolved at this time.  He is regaining some of his strength but still quite debilitated and has been using a cane every day except for today where he is able to ambulate without.  His appetite is improving but overall poor.   He denied any alteration mental status, neuropathy, confusion or dizziness.  Denies any headaches or lethargy.  Denies any night sweats, weight loss or changes in appetite.  Denied orthopnea, dyspnea on exertion or chest discomfort.  Denies shortness of breath, difficulty breathing hemoptysis or cough.  Denies any abdominal distention, nausea, early satiety or dyspepsia.  Denies any hematuria, frequency, dysuria or nocturia.  Denies any skin irritation, dryness or rash.  Denies any ecchymosis or petechiae.  Denies any lymphadenopathy or clotting.  Denies any heat or cold intolerance.  Denies any anxiety or depression.  Remaining review of system is negative.                Medications: I  have reviewed the patient's current medications.  Current Outpatient Medications  Medication Sig Dispense Refill  . Acetaminophen (TYLENOL PO) Take by mouth every 6 (six) hours as needed.    . Cholecalciferol (VITAMIN D3) 125 MCG (5000 UT) CAPS Take 5,000 Units by mouth daily.    . Cyanocobalamin (VITAMIN B-12) 5000 MCG TBDP Take 5,000 mcg by mouth daily.     . Digestive Enzymes (DIGESTIVE ENZYME PO) Take 1 capsule by mouth 2 (two) times daily.     . diphenhydrAMINE (BENADRYL) 25 MG tablet Take 25 mg by mouth at bedtime.    . Emollient (CERAVE) CREA Apply 1 application topically 2 (two) times daily.    Marland Kitchen HYDROcodone-acetaminophen (NORCO/VICODIN) 5-325 MG tablet Take 1 tablet by mouth every 4 (four) hours as needed for moderate pain. 12 tablet 0  . lidocaine-prilocaine (EMLA) cream Apply 1 application topically as needed. 30 g 0  . LORazepam (ATIVAN) 0.5 MG tablet Place 1 tablet (0.5 mg total) under the tongue every 8 (eight) hours as needed (nausea). 30 tablet 0  . magnesium oxide (MAG-OX) 400 MG tablet TAKE 1 TABLET BY MOUTH EVERY DAY 30 tablet 0  . Menaquinone-7 (VITAMIN K2 PO) Take 50 mcg by mouth daily.     . metFORMIN (GLUCOPHAGE) 500 MG tablet TAKE 1 TABLET (500 MG TOTAL) BY MOUTH 2 (TWO) TIMES DAILY WITH A MEAL. 30 tablet 0  . metFORMIN (GLUCOPHAGE) 500 MG tablet Take 1 tablet (500 mg total) by mouth 2 (two) times daily with a meal. 90 tablet 0  . OVER THE COUNTER MEDICATION Take 240  mLs by mouth daily. Asca Liquid Supplement    . OVER THE COUNTER MEDICATION 1,000 mg 2 (two) times daily. Graviola    . OVER THE COUNTER MEDICATION CTFO super fruit chew    . OVER THE COUNTER MEDICATION Alive Men's 50+ gummy    . OVER THE COUNTER MEDICATION Elderberry syrup with turmeric    . prochlorperazine (COMPAZINE) 10 MG tablet TAKE 1 TABLET BY MOUTH EVERY 6 HOURS AS NEEDED FOR NAUSEA OR VOMITING. 30 tablet 0  . Sennosides-Docusate Sodium (STOOL SOFTENER & LAXATIVE PO) Take by mouth 2 (two) times  daily.    . tamsulosin (FLOMAX) 0.4 MG CAPS capsule Take 0.4 mg by mouth daily.    Marland Kitchen tolnaftate (TINACTIN) 1 % spray Apply 1 spray topically daily as needed (for feet itching).    . triamcinolone cream (KENALOG) 0.1 % APPLY TO AFFECTED AREA TWICE A DAY (Patient taking differently: Apply 1 application topically 2 (two) times daily as needed (for itching). ) 80 g 1  . UNABLE TO FIND 2 (two) times daily as needed (for constipation). Triphala    . UNABLE TO FIND 1,000 mg 3 (three) times daily. Med Name:[facility]    . Zinc 30 MG TABS Take by mouth daily.     No current facility-administered medications for this visit.      Allergies:  Allergies  Allergen Reactions  . Sulfa Antibiotics Hives    Past Medical History, Surgical history, Social history, and Family History were reviewed and updated.    Physical Exam:   Blood pressure (!) 145/62, pulse 72, temperature 98 F (36.7 C), temperature source Oral, resp. rate 17, height 6' (1.829 m), weight 175 lb 8 oz (79.6 kg), SpO2 98 %.    ECOG: 0    General appearance: Alert, awake without any distress. Head: Atraumatic without abnormalities Oropharynx: Without any thrush or ulcers. Eyes: No scleral icterus. Lymph nodes: No lymphadenopathy noted in the cervical, supraclavicular, or axillary nodes Heart:regular rate and rhythm, without any murmurs or gallops.   Lung: Clear to auscultation without any rhonchi, wheezes or dullness to percussion. Abdomin: Soft, nontender without any shifting dullness or ascites. Musculoskeletal: No clubbing or cyanosis. Neurological: No motor or sensory deficits. Skin: No rashes or lesions.   .       Lab Results: Lab Results  Component Value Date   WBC 4.1 09/26/2018   HGB 8.3 (L) 09/26/2018   HCT 23.2 (L) 09/26/2018   MCV 94.3 09/26/2018   PLT 188 09/26/2018     Chemistry      Component Value Date/Time   NA 139 09/26/2018 1418   K 4.7 09/26/2018 1418   CL 100 09/26/2018 1418   CO2 25  09/26/2018 1418   BUN 38 (H) 09/26/2018 1418   CREATININE 1.47 (H) 09/26/2018 1418   CREATININE 1.05 01/17/2015 1559      Component Value Date/Time   CALCIUM 9.2 09/26/2018 1418   ALKPHOS 55 09/26/2018 1418   AST 27 09/26/2018 1418   ALT 29 09/26/2018 1418   BILITOT 0.7 09/26/2018 1418       Impression and Plan:  68 year old man with:  1.  Bladder cancer diagnosed in January 2020.  He was found to have pelvic adenopathy indicating stage IV disease.   He is receiving systemic chemotherapy utilizing gemcitabine and cisplatin with few complications.  Risks and benefits of continuing chemotherapy for 6 cycles versus discontinuation of this time were reviewed.  Complication associated with continuing chemotherapy include mild suppression, worsening anemia as  well as worsening renal insufficiency.  After discussion, we have opted to hold a cycle 6 of chemotherapy for the time being.  Will obtain CT scan in the immediate future for staging purposes and decide whether to proceed with salvage cystectomy versus continuing chemotherapy.  2.  IV access: Port-A-Cath is in place without any issues or concerns at this time.  3.  Antiemetics: He has experienced some mild nausea and vomiting at times.  Antiemetics will be adjusted accordingly. Chemotherapy will improve his symptoms.   4.  Renal function surveillance: His creatinine did increase slightly with creatinine clearance currently around 50 cc/min.  Will adjust cisplatin dosing accordingly.  5.  Scrotal irritation: Manageable at this time with a condom catheter with chemotherapy.  5.  Goals of care: Aggressive therapy is warranted at this time.  His performance status is adequate and treatment remains curative.  6.  Neutropenia: No growth factor support is needed at this time.  7.  Hypomagnesemia: Magnesium will be checked periodically and currently on adequate supplements.  8.  Anemia: His hemoglobin is down to 7.3 and mildly  symptomatic.  Holding chemotherapy will help his counts.  We will hold off on transfusion at this time.  9.  Constipation: Aggressive bowel regimen was discussed today including daily stool softeners and laxative if needed.  10.  Follow-up: We will be in the next few weeks to follow his progress.  25  minutes was spent with the patient face-to-face today.  More than 50% of time was spent on reviewing his disease status, treatment options and dealing with complications related to chemotherapy.    Zola Button, MD 5/13/20208:06 AM

## 2018-10-09 ENCOUNTER — Telehealth: Payer: Self-pay | Admitting: Oncology

## 2018-10-09 ENCOUNTER — Inpatient Hospital Stay: Payer: Medicare Other

## 2018-10-09 NOTE — Telephone Encounter (Signed)
Called regarding schedule °

## 2018-10-10 ENCOUNTER — Telehealth: Payer: Self-pay | Admitting: Oncology

## 2018-10-10 NOTE — Telephone Encounter (Signed)
Spoke with spouse re labport for 5/21. Added per 5/13 schedule message. Left message for desk nurse to confirm if appointment should be just flush or lab/port.

## 2018-10-16 ENCOUNTER — Inpatient Hospital Stay: Payer: Medicare Other

## 2018-10-16 ENCOUNTER — Ambulatory Visit (HOSPITAL_COMMUNITY): Admission: RE | Admit: 2018-10-16 | Payer: Medicare Other | Source: Ambulatory Visit

## 2018-10-16 ENCOUNTER — Other Ambulatory Visit: Payer: Medicare Other

## 2018-10-16 ENCOUNTER — Other Ambulatory Visit: Payer: Self-pay

## 2018-10-16 ENCOUNTER — Ambulatory Visit: Payer: Medicare Other

## 2018-10-16 ENCOUNTER — Telehealth: Payer: Self-pay

## 2018-10-16 ENCOUNTER — Other Ambulatory Visit: Payer: Self-pay | Admitting: Oncology

## 2018-10-16 NOTE — Telephone Encounter (Signed)
Patient vomited contrast when he tried to drink it. Dr. Alen Blew made aware and instructed to reschedule CT for next week. Spoke to patient's wife and made her aware of rescheduled appointments for next week. Appointments 10/23/18 starting with lab at 9:15. Verbalized understanding.

## 2018-10-18 ENCOUNTER — Other Ambulatory Visit: Payer: Self-pay | Admitting: Family Medicine

## 2018-10-18 DIAGNOSIS — E119 Type 2 diabetes mellitus without complications: Secondary | ICD-10-CM

## 2018-10-22 ENCOUNTER — Encounter: Payer: Self-pay | Admitting: Family Medicine

## 2018-10-23 ENCOUNTER — Inpatient Hospital Stay: Payer: Medicare Other

## 2018-10-23 ENCOUNTER — Encounter (HOSPITAL_COMMUNITY): Payer: Self-pay

## 2018-10-23 ENCOUNTER — Encounter: Payer: Self-pay | Admitting: Family Medicine

## 2018-10-23 ENCOUNTER — Other Ambulatory Visit: Payer: Self-pay

## 2018-10-23 ENCOUNTER — Ambulatory Visit (HOSPITAL_COMMUNITY)
Admission: RE | Admit: 2018-10-23 | Discharge: 2018-10-23 | Disposition: A | Discharge status: Home / Self Care | Payer: Medicare Other | Source: Ambulatory Visit | Attending: Oncology | Admitting: Oncology

## 2018-10-23 ENCOUNTER — Ambulatory Visit (INDEPENDENT_AMBULATORY_CARE_PROVIDER_SITE_OTHER): Payer: Medicare Other | Admitting: Family Medicine

## 2018-10-23 DIAGNOSIS — C678 Malignant neoplasm of overlapping sites of bladder: Secondary | ICD-10-CM

## 2018-10-23 DIAGNOSIS — R112 Nausea with vomiting, unspecified: Secondary | ICD-10-CM | POA: Diagnosis not present

## 2018-10-23 DIAGNOSIS — N289 Disorder of kidney and ureter, unspecified: Secondary | ICD-10-CM | POA: Diagnosis not present

## 2018-10-23 DIAGNOSIS — E119 Type 2 diabetes mellitus without complications: Secondary | ICD-10-CM

## 2018-10-23 DIAGNOSIS — Z95828 Presence of other vascular implants and grafts: Secondary | ICD-10-CM

## 2018-10-23 LAB — CMP (CANCER CENTER ONLY)
ALT: 14 U/L (ref 0–44)
AST: 17 U/L (ref 15–41)
Albumin: 4.2 g/dL (ref 3.5–5.0)
Alkaline Phosphatase: 78 U/L (ref 38–126)
Anion gap: 10 (ref 5–15)
BUN: 33 mg/dL — ABNORMAL HIGH (ref 8–23)
CO2: 26 mmol/L (ref 22–32)
Calcium: 9.8 mg/dL (ref 8.9–10.3)
Chloride: 101 mmol/L (ref 98–111)
Creatinine: 1.4 mg/dL — ABNORMAL HIGH (ref 0.61–1.24)
GFR, Est AFR Am: 59 mL/min — ABNORMAL LOW (ref >60)
GFR, Estimated: 51 mL/min — ABNORMAL LOW (ref >60)
Glucose, Bld: 135 mg/dL — ABNORMAL HIGH (ref 70–99)
Potassium: 4.8 mmol/L (ref 3.5–5.1)
Sodium: 137 mmol/L (ref 135–145)
Total Bilirubin: 0.3 mg/dL (ref 0.3–1.2)
Total Protein: 7.1 g/dL (ref 6.5–8.1)

## 2018-10-23 LAB — CBC WITH DIFFERENTIAL (CANCER CENTER ONLY)
Abs Immature Granulocytes: 0.05 10*3/uL (ref 0.00–0.07)
Basophils Absolute: 0.1 10*3/uL (ref 0.0–0.1)
Basophils Relative: 1 %
Eosinophils Absolute: 0.1 10*3/uL (ref 0.0–0.5)
Eosinophils Relative: 1 %
HCT: 30.4 % — ABNORMAL LOW (ref 39.0–52.0)
Hemoglobin: 10.2 g/dL — ABNORMAL LOW (ref 13.0–17.0)
Immature Granulocytes: 1 %
Lymphocytes Relative: 14 %
Lymphs Abs: 1.2 10*3/uL (ref 0.7–4.0)
MCH: 34.1 pg — ABNORMAL HIGH (ref 26.0–34.0)
MCHC: 33.6 g/dL (ref 30.0–36.0)
MCV: 101.7 fL — ABNORMAL HIGH (ref 80.0–100.0)
Monocytes Absolute: 0.8 10*3/uL (ref 0.1–1.0)
Monocytes Relative: 10 %
Neutro Abs: 6.2 10*3/uL (ref 1.7–7.7)
Neutrophils Relative %: 73 %
Platelet Count: 236 10*3/uL (ref 150–400)
RBC: 2.99 MIL/uL — ABNORMAL LOW (ref 4.22–5.81)
RDW: 15.1 % (ref 11.5–15.5)
WBC Count: 8.4 10*3/uL (ref 4.0–10.5)
nRBC: 0 % (ref 0.0–0.2)

## 2018-10-23 MED ORDER — HEPARIN SOD (PORK) LOCK FLUSH 100 UNIT/ML IV SOLN
500.0000 [IU] | Freq: Once | INTRAVENOUS | Status: AC
  Administered 2018-10-23: 11:00:00 500 [IU] via INTRAVENOUS

## 2018-10-23 MED ORDER — SODIUM CHLORIDE (PF) 0.9 % IJ SOLN
INTRAMUSCULAR | Status: AC
  Filled 2018-10-23: qty 50

## 2018-10-23 MED ORDER — SODIUM CHLORIDE 0.9% FLUSH
10.0000 mL | Freq: Once | INTRAVENOUS | Status: AC
  Administered 2018-10-23: 10 mL
  Filled 2018-10-23: qty 10

## 2018-10-23 MED ORDER — SODIUM CHLORIDE 0.9 % IV SOLN
INTRAVENOUS | Status: AC
  Filled 2018-10-23: qty 250

## 2018-10-23 MED ORDER — HEPARIN SOD (PORK) LOCK FLUSH 100 UNIT/ML IV SOLN
INTRAVENOUS | Status: AC
  Filled 2018-10-23: qty 5

## 2018-10-23 MED ORDER — IOHEXOL 300 MG/ML  SOLN
100.0000 mL | Freq: Once | INTRAMUSCULAR | Status: AC | PRN
  Administered 2018-10-23: 11:00:00 100 mL via INTRAVENOUS

## 2018-10-23 MED ORDER — SITAGLIPTIN PHOSPHATE 100 MG PO TABS: 100.0000 mg | ORAL_TABLET | Freq: Every day | ORAL | 6 refills | Status: DC

## 2018-10-23 NOTE — Patient Instructions (Signed)
Sorry I dropped our phone call at the very end!  To summarize, we are going to stop metformin for the time being and change to a medication called Januvia for diabetes control.   We are making this change due to decrease in your kidney function - likely due to current treatment protocol.  Please watch for any symptoms of low blood sugar, and let me know if your glucose is getting to be too high at oncology clinic.   Also, we should repeat an A1c in about 3 months.  I am glad to do this, or oncology might be able to order it for you  If the Januvia is too expensive please don't fill the rx and let me know- we can think of an alternative for you  Best JC

## 2018-10-23 NOTE — Progress Notes (Signed)
[facility] [address], [place], [place] [address] [phone]  Date:  10/23/2018   Name:  Sergio Pennington   DOB:  [birth year]   MRN:  [id number]  PCP:  Sergio Mclean, MD    Chief Complaint: No chief complaint on file.   History of Present Illness:  Sergio Pennington is a 68 y.o. very pleasant male patient who presents with the following:  Virtual visit today for management of diabetes Patient location is home Provider location is office Patient any confirmed with 2 factors, he gives consent for virtual visit today His wife is also present on the call  I have not seen Sergio Pennington since January 2019, previously I saw him in December 2017 Since her last visit, he was diagnosed with stage IV bladder cancer and is currently undergoing chemotherapy.  At our last visit his diabetes was well controlled with a moderate dose of metformin. His wife had recently contacted me about refilling his metformin, but I noted that his renal function has become worse recently.  Would like to change to a different medication for diabetes  Lab Results  Component Value Date   HGBA1C 6.8 (H) 06/13/2018   Most recent A1c showed good control of his diabetes He is currently taking Metformin 500 BID  He does not check his glucose at home No history of pancreatis or CHF   He went to the ER in November 2019 with gross hematuria-this led to diagnosis of bladder cancer He had resection of his tumor in January per Dr. Gloriann Loan, and is now seeing Dr.Shahad for oncology care He unfortunately was stage IV with pelvic adenopathy at time of diagnosis He is currently on systemic chemo-however per his last oncology note he is taking a break at this time  He had a staging CT earlier today, which per my understanding looks favorable.  GFR had actually improved on labs today. However, as his GFR is been borderline would like to  change from metformin to another medication to avoid risk of toxicity.  He understands this plan and is in agreement  Patient Active Problem List   Diagnosis Date Noted  . Port-A-Cath in place 07/03/2018  . Bladder cancer ([place]) 06/17/2018  . DIAB W/O MENTION COMP TYPE II/UNS TYPE UNCNTRL 03/08/2008  . DIABETES MELLITUS, TYPE II, UNCONTROLLED, W/NEUROLO COMPS 03/08/2008  . HYPERLIPIDEMIA 03/08/2008  . TOBACCO ABUSE 03/08/2008    Past Medical History:  Diagnosis Date  . Bladder cancer ([place])   . Cellulitis    Left leg   . Constipation   . Diabetes mellitus without complication ([place])   . Diabetic neuropathy ([place])    Feet  . Hematuria 04/21/2018  . Hyperlipidemia   . 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 Vasculitis ([place])    Left leg    Past Surgical History:  Procedure Laterality Date  . IR IMAGING GUIDED PORT INSERTION  06/26/2018  . MOUTH SURGERY  1980  . TONSILLECTOMY     4th grade  . TRANSURETHRAL RESECTION OF BLADDER TUMOR N/A 06/16/2018   Procedure: TRANSURETHRAL RESECTION OF BLADDER TUMOR (TURBT);  Surgeon: Lucas Mallow, MD;  Location: [facility] ORS;  Service: Urology;  Laterality: N/A;    Social History   Tobacco Use  . Smoking status: Former Research scientist (life sciences)  . Smokeless tobacco: Never Used  Substance Use Topics  . Alcohol use: Not Currently  Alcohol/week: 1.0 standard drinks    Types: 1 Glasses of wine per week  . Drug use: Not Currently    No family history on file.  Allergies  Allergen Reactions  . Sulfa Antibiotics Hives    Medication list has been reviewed and updated.  Current Outpatient Medications on File Prior to Visit  Medication Sig Dispense Refill  . Acetaminophen (TYLENOL PO) Take by mouth every 6 (six) hours as needed.    . Cholecalciferol (VITAMIN D3) 125 MCG (5000 UT) CAPS Take 5,000 Units by mouth daily.    . Cyanocobalamin (VITAMIN B-12) 5000 MCG TBDP Take 5,000 mcg by mouth daily.     . Digestive Enzymes  (DIGESTIVE ENZYME PO) Take 1 capsule by mouth 2 (two) times daily.     . diphenhydrAMINE (BENADRYL) 25 MG tablet Take 25 mg by mouth at bedtime.    . Emollient (CERAVE) CREA Apply 1 application topically 2 (two) times daily.    Marland Kitchen HYDROcodone-acetaminophen (NORCO/VICODIN) 5-325 MG tablet Take 1 tablet by mouth every 4 (four) hours as needed for moderate pain. 12 tablet 0  . lidocaine-prilocaine (EMLA) cream Apply 1 application topically as needed. 30 g 0  . LORazepam (ATIVAN) 0.5 MG tablet Place 1 tablet (0.5 mg total) under the tongue every 8 (eight) hours as needed (nausea). 30 tablet 0  . magnesium oxide (MAG-OX) 400 MG tablet TAKE 1 TABLET BY MOUTH EVERY DAY 30 tablet 0  . Menaquinone-7 (VITAMIN K2 PO) Take 50 mcg by mouth daily.     . metFORMIN (GLUCOPHAGE) 500 MG tablet TAKE 1 TABLET (500 MG TOTAL) BY MOUTH 2 (TWO) TIMES DAILY WITH A MEAL. 30 tablet 0  . metFORMIN (GLUCOPHAGE) 500 MG tablet TAKE 1 TABLET BY MOUTH 2 (TWO) TIMES DAILY WITH A MEAL. MAKE OFFICE VISIT FOR REFILLS 15 tablet 0  . OVER THE COUNTER MEDICATION Take 240 mLs by mouth daily. Asca Liquid Supplement    . OVER THE COUNTER MEDICATION 1,000 mg 2 (two) times daily. Graviola    . OVER THE COUNTER MEDICATION CTFO super fruit chew    . OVER THE COUNTER MEDICATION Alive Men's 50+ gummy    . OVER THE COUNTER MEDICATION Elderberry syrup with turmeric    . prochlorperazine (COMPAZINE) 10 MG tablet TAKE 1 TABLET BY MOUTH EVERY 6 HOURS AS NEEDED FOR NAUSEA OR VOMITING. 30 tablet 0  . senna-docusate (SENNA S) 8.6-50 MG tablet Take 1 tablet by mouth 2 (two) times daily. 90 tablet 3  . Sennosides-Docusate Sodium (STOOL SOFTENER & LAXATIVE PO) Take by mouth 2 (two) times daily.    . tamsulosin (FLOMAX) 0.4 MG CAPS capsule Take 0.4 mg by mouth daily.    Marland Kitchen tolnaftate (TINACTIN) 1 % spray Apply 1 spray topically daily as needed (for feet itching).    . triamcinolone cream (KENALOG) 0.1 % APPLY TO AFFECTED AREA TWICE A DAY (Patient taking  differently: Apply 1 application topically 2 (two) times daily as needed (for itching). ) 80 g 1  . UNABLE TO FIND 2 (two) times daily as needed (for constipation). Triphala    . UNABLE TO FIND 1,000 mg 3 (three) times daily. Med Name:AHCC    . Zinc 30 MG TABS Take by mouth daily.     Current Facility-Administered Medications on File Prior to Visit  Medication Dose Route Frequency Provider Last Rate Last Dose  . heparin lock flush 100 UNIT/ML injection           . sodium chloride (PF) 0.9 % injection           .  sodium chloride 0.9 % infusion             Review of Systems:  As per HPI- otherwise negative. No fever  Physical Examination: There were no vitals filed for this visit. There were no vitals filed for this visit. There is no height or weight on file to calculate BMI. Ideal Body Weight:    Pt observed on video today- he looks well He is not checking his BP at home  BP Readings from Last 3 Encounters:  10/08/18 (!) 145/62  09/26/18 (!) 140/56  09/24/18 (!) 166/71    Assessment and Plan: Diabetes mellitus without complication ([place]) - Plan: sitaGLIPtin (JANUVIA) 100 MG tablet  Malignant neoplasm of overlapping sites of bladder ([place])  Renal insufficiency  Following up today, I last saw this patient about 18 months ago.  He has well-controlled diabetes, is currently taking metformin but his renal function has recently worsened.  We will stop metformin and change to Januvia 100 mg.  Please see further discussion below  Sent pt the following mychart message,  Our phone call cut off just as I was saying goodbye  Sorry I dropped our phone call at the very end!  To summarize, we are going to stop metformin for the time being and change to a medication called Januvia for diabetes control.   We are making this change due to decrease in your kidney function - likely due to current treatment protocol.  Please watch for any symptoms of low blood sugar, and let me know if your  glucose is getting to be too high at oncology clinic.   Also, we should repeat an A1c in about 3 months.  I am glad to do this, or oncology might be able to order it for you  If the Januvia is too expensive please don't fill the rx and let me know- we can think of an alternative for you  Best JC   Signed Lamar Blinks, MD  Estimated creat clearance is approx 55

## 2018-10-25 ENCOUNTER — Encounter: Payer: Self-pay | Admitting: Family Medicine

## 2018-10-27 ENCOUNTER — Encounter: Payer: Self-pay | Admitting: Family Medicine

## 2018-10-28 ENCOUNTER — Encounter: Payer: Self-pay | Admitting: Family Medicine

## 2018-10-28 ENCOUNTER — Inpatient Hospital Stay: Payer: Medicare Other | Attending: Oncology

## 2018-10-28 ENCOUNTER — Other Ambulatory Visit: Payer: Self-pay

## 2018-10-28 ENCOUNTER — Inpatient Hospital Stay (HOSPITAL_BASED_OUTPATIENT_CLINIC_OR_DEPARTMENT_OTHER): Payer: Medicare Other | Admitting: Oncology

## 2018-10-28 VITALS — BP 140/69 | HR 75 | Temp 98.7°F | Resp 17 | Ht 72.0 in | Wt 170.9 lb

## 2018-10-28 DIAGNOSIS — Z8551 Personal history of malignant neoplasm of bladder: Secondary | ICD-10-CM | POA: Diagnosis present

## 2018-10-28 DIAGNOSIS — R110 Nausea: Secondary | ICD-10-CM | POA: Insufficient documentation

## 2018-10-28 DIAGNOSIS — C678 Malignant neoplasm of overlapping sites of bladder: Secondary | ICD-10-CM

## 2018-10-28 DIAGNOSIS — D649 Anemia, unspecified: Secondary | ICD-10-CM | POA: Insufficient documentation

## 2018-10-28 DIAGNOSIS — Z95828 Presence of other vascular implants and grafts: Secondary | ICD-10-CM

## 2018-10-28 LAB — CBC WITH DIFFERENTIAL (CANCER CENTER ONLY)
Abs Immature Granulocytes: 0.05 10*3/uL (ref 0.00–0.07)
Basophils Absolute: 0.1 10*3/uL (ref 0.0–0.1)
Basophils Relative: 1 %
Eosinophils Absolute: 0.2 10*3/uL (ref 0.0–0.5)
Eosinophils Relative: 2 %
HCT: 31.8 % — ABNORMAL LOW (ref 39.0–52.0)
Hemoglobin: 10.6 g/dL — ABNORMAL LOW (ref 13.0–17.0)
Immature Granulocytes: 1 %
Lymphocytes Relative: 10 %
Lymphs Abs: 1 10*3/uL (ref 0.7–4.0)
MCH: 34.3 pg — ABNORMAL HIGH (ref 26.0–34.0)
MCHC: 33.3 g/dL (ref 30.0–36.0)
MCV: 102.9 fL — ABNORMAL HIGH (ref 80.0–100.0)
Monocytes Absolute: 0.7 10*3/uL (ref 0.1–1.0)
Monocytes Relative: 8 %
Neutro Abs: 7.6 10*3/uL (ref 1.7–7.7)
Neutrophils Relative %: 78 %
Platelet Count: 183 10*3/uL (ref 150–400)
RBC: 3.09 MIL/uL — ABNORMAL LOW (ref 4.22–5.81)
RDW: 14.1 % (ref 11.5–15.5)
WBC Count: 9.6 10*3/uL (ref 4.0–10.5)
nRBC: 0 % (ref 0.0–0.2)

## 2018-10-28 LAB — CMP (CANCER CENTER ONLY)
ALT: 14 U/L (ref 0–44)
AST: 14 U/L — ABNORMAL LOW (ref 15–41)
Albumin: 3.9 g/dL (ref 3.5–5.0)
Alkaline Phosphatase: 83 U/L (ref 38–126)
Anion gap: 9 (ref 5–15)
BUN: 43 mg/dL — ABNORMAL HIGH (ref 8–23)
CO2: 27 mmol/L (ref 22–32)
Calcium: 9.7 mg/dL (ref 8.9–10.3)
Chloride: 101 mmol/L (ref 98–111)
Creatinine: 1.4 mg/dL — ABNORMAL HIGH (ref 0.61–1.24)
GFR, Est AFR Am: 59 mL/min — ABNORMAL LOW (ref >60)
GFR, Estimated: 51 mL/min — ABNORMAL LOW (ref >60)
Glucose, Bld: 216 mg/dL — ABNORMAL HIGH (ref 70–99)
Potassium: 5 mmol/L (ref 3.5–5.1)
Sodium: 137 mmol/L (ref 135–145)
Total Bilirubin: 0.2 mg/dL — ABNORMAL LOW (ref 0.3–1.2)
Total Protein: 6.8 g/dL (ref 6.5–8.1)

## 2018-10-28 LAB — MAGNESIUM: Magnesium: 1.6 mg/dL — ABNORMAL LOW (ref 1.7–2.4)

## 2018-10-28 NOTE — Progress Notes (Signed)
Hematology and Oncology Follow Up Visit  Sergio Pennington [id number] Nov 22, 1950 68 y.o. 10/28/2018 9:17 AM [name], [name], MDCopland, [name], MD   Principle Diagnosis: 68 year old man with stage IV bladder cancer diagnosed with pelvic adenopathy in January 2020.    Prior Therapy:  Status post TURBT on January 28 of 2020 showed a large tumor with the pathology showed high-grade urothelial carcinoma.  Chemotherapy utilizing cisplatin and gemcitabine on day 1 and gemcitabine on day 8 out of a 21-day cycle.  He completed 5 cycles of therapy on April 29 of 2020.   Current therapy: Active surveillance.  Interim History: [name] returns today for a repeat evaluation.  Since the last visit, he completed 5 cycles of chemotherapy with few complications.  He has reported some increased fatigue, tiredness and and unsteadiness.  He does report some nausea although it is improving slowly.  His weight has decreased slightly although his appetite may be improving.  He has not reported any residual neuropathy or hearing deficits.  Patient denied headaches, blurry vision, syncope or seizures.  Denies any fevers, chills or sweats.  Denied chest pain, palpitation, orthopnea or leg edema.  Denied cough, wheezing or hemoptysis.  Denied nausea, vomiting or abdominal pain.  Denies any constipation or diarrhea.  Denies any frequency urgency or hesitancy.  Denies any arthralgias or myalgias.  Denies any skin rashes or lesions.  Denies any bleeding or clotting tendency.  Denies any easy bruising.  Denies any hair or nail changes.  Denies any anxiety or depression.  Remaining review of system is negative.                   Medications: I have reviewed the patient's current medications.  Current Outpatient Medications  Medication Sig Dispense Refill  . Acetaminophen (TYLENOL PO) Take by mouth every 6 (six) hours as needed.    . Cholecalciferol (VITAMIN D3) 125 MCG (5000  UT) CAPS Take 5,000 Units by mouth daily.    . Cyanocobalamin (VITAMIN B-12) 5000 MCG TBDP Take 5,000 mcg by mouth daily.     . Digestive Enzymes (DIGESTIVE ENZYME PO) Take 1 capsule by mouth 2 (two) times daily.     . diphenhydrAMINE (BENADRYL) 25 MG tablet Take 25 mg by mouth at bedtime.    . Emollient (CERAVE) CREA Apply 1 application topically 2 (two) times daily.    Marland Kitchen HYDROcodone-acetaminophen (NORCO/VICODIN) 5-325 MG tablet Take 1 tablet by mouth every 4 (four) hours as needed for moderate pain. 12 tablet 0  . lidocaine-prilocaine (EMLA) cream Apply 1 application topically as needed. 30 g 0  . LORazepam (ATIVAN) 0.5 MG tablet Place 1 tablet (0.5 mg total) under the tongue every 8 (eight) hours as needed (nausea). 30 tablet 0  . magnesium oxide (MAG-OX) 400 MG tablet TAKE 1 TABLET BY MOUTH EVERY DAY 30 tablet 0  . Menaquinone-7 (VITAMIN K2 PO) Take 50 mcg by mouth daily.     Marland Kitchen OVER THE COUNTER MEDICATION Take 240 mLs by mouth daily. Asca Liquid Supplement    . OVER THE COUNTER MEDICATION 1,000 mg 2 (two) times daily. Graviola    . OVER THE COUNTER MEDICATION CTFO super fruit chew    . OVER THE COUNTER MEDICATION Alive Men's 50+ gummy    . OVER THE COUNTER MEDICATION Elderberry syrup with turmeric    . prochlorperazine (COMPAZINE) 10 MG tablet TAKE 1 TABLET BY MOUTH EVERY 6 HOURS AS NEEDED FOR NAUSEA OR VOMITING. 30 tablet 0  . senna-docusate (SENNA  S) 8.6-50 MG tablet Take 1 tablet by mouth 2 (two) times daily. 90 tablet 3  . Sennosides-Docusate Sodium (STOOL SOFTENER & LAXATIVE PO) Take by mouth 2 (two) times daily.    . sitaGLIPtin (JANUVIA) 100 MG tablet Take 1 tablet (100 mg total) by mouth daily. 30 tablet 6  . tamsulosin (FLOMAX) 0.4 MG CAPS capsule Take 0.4 mg by mouth daily.    Marland Kitchen tolnaftate (TINACTIN) 1 % spray Apply 1 spray topically daily as needed (for feet itching).    . triamcinolone cream (KENALOG) 0.1 % APPLY TO AFFECTED AREA TWICE A DAY (Patient taking differently: Apply 1  application topically 2 (two) times daily as needed (for itching). ) 80 g 1  . UNABLE TO FIND 2 (two) times daily as needed (for constipation). Triphala    . UNABLE TO FIND 1,000 mg 3 (three) times daily. Med Name:AHCC    . Zinc 30 MG TABS Take by mouth daily.     No current facility-administered medications for this visit.      Allergies:  Allergies  Allergen Reactions  . Sulfa Antibiotics Hives    Past Medical History, Surgical history, Social history, and Family History were reviewed and updated.    Physical Exam:   Blood pressure 140/69, pulse 75, temperature 98.7 F (37.1 C), temperature source Oral, resp. rate 17, height 6' (1.829 m), weight 170 lb 14.4 oz (77.5 kg), SpO2 99 %.     ECOG: 1    General appearance: Comfortable appearing without any discomfort Head: Normocephalic without any trauma Oropharynx: Mucous membranes are moist and pink without any thrush or ulcers. Eyes: Pupils are equal and round reactive to light. Lymph nodes: No cervical, supraclavicular, inguinal or axillary lymphadenopathy.   Heart:regular rate and rhythm.  S1 and S2 without leg edema. Lung: Clear without any rhonchi or wheezes.  No dullness to percussion. Abdomin: Soft, nontender, nondistended with good bowel sounds.  No hepatosplenomegaly. Musculoskeletal: No joint deformity or effusion.  Full range of motion noted. Neurological: No deficits noted on motor, sensory and deep tendon reflex exam. Skin: No petechial rash or dryness.  Appeared moist.     .       Lab Results: Lab Results  Component Value Date   WBC 8.4 10/23/2018   HGB 10.2 (L) 10/23/2018   HCT 30.4 (L) 10/23/2018   MCV 101.7 (H) 10/23/2018   PLT 236 10/23/2018     Chemistry      Component Value Date/Time   NA 137 10/23/2018 0919   K 4.8 10/23/2018 0919   CL 101 10/23/2018 0919   CO2 26 10/23/2018 0919   BUN 33 (H) 10/23/2018 0919   CREATININE 1.40 (H) 10/23/2018 0919   CREATININE 1.05 01/17/2015  1559      Component Value Date/Time   CALCIUM 9.8 10/23/2018 0919   ALKPHOS 78 10/23/2018 0919   AST 17 10/23/2018 0919   ALT 14 10/23/2018 0919   BILITOT 0.3 10/23/2018 0919     EXAM: CT CHEST, ABDOMEN, AND PELVIS WITH CONTRAST  TECHNIQUE: Multidetector CT imaging of the chest, abdomen and pelvis was performed following the standard protocol during bolus administration of intravenous contrast.  CONTRAST:  129mL OMNIPAQUE IOHEXOL 300 MG/ML  SOLN  COMPARISON:  PET-CT from 06/30/2018 and CT abdomen from 06/06/2018  FINDINGS: CT CHEST FINDINGS  Cardiovascular: Right Port-A-Cath tip: Lower SVC.  Coronary, aortic arch, and branch vessel atherosclerotic vascular disease.  Mediastinum/Nodes: Small type 1 hiatal hernia. There is a low-density anterior pericardial lymph node  measuring 1.1 cm in short axis on image 48/3, formerly 0.6 cm.  Lungs/Pleura: Stable mild scarring at the lung apices. Reticulonodular opacity and small bulla anteriorly in the left upper lobe similar to prior. Additional scattered faint reticulonodular opacities inferiorly in the right upper lobe and in both lower lobes, not appreciably changed. No compelling findings for metastatic disease to the lungs.  Musculoskeletal: Sclerosis and anterior cortical irregularity in the upper sternal body on image 75/6, most likely from healing fracture, but subtle enough that I am not sure whether was present are not on the PET-CT from 06/30/2018. Mild superior endplate wedging at T1, T2, and T3, age indeterminate.  CT ABDOMEN PELVIS FINDINGS  Hepatobiliary: Unremarkable  Pancreas: Unremarkable  Spleen: Unremarkable  Adrenals/Urinary Tract: Complex right kidney lower pole cyst measuring 5.6 by 3.1 cm on image 79/3 with multiple internal septations and faint calcifications along the intersecting septations. No obvious enhancing component or definite nodularity.  The adrenal glands appear  normal.  Borderline right hydroureter.  There is some circumferential bladder wall thickening which is possibly due to nondistention, but superimposed on this there is abnormal wall thickening in the right anterior urinary bladder measuring 2.1 cm in thickness (previously about 3.9 cm on the prior PET-CT). There is a contrast-fluid level in the urinary bladder non dependently. There is also some mixed density in the right ureter which is probably from a contrast fluid level, 1 less likely to be from tumor in the right distal ureter, image 81/9.  Stomach/Bowel: Mild wall thickening along the posterior wall of the rectum, may well be due to nondistention but is technically nonspecific.  Vascular/Lymphatic: Aortoiliac atherosclerotic vascular disease. Considerable improvement in the prior lower periaortic and bilateral pelvic adenopathy. For example, a left periaortic lymph node measuring 0.3 cm on image 85/3 previously measured 1.3 cm on 06/30/2018. A right common iliac node measuring 0.4 cm on image 96/3 previously measured 1.5 cm. A left external iliac node measuring 0.4 cm on image 109/3 previously measured 1.5 cm in short axis. Overall the prior pathologic adenopathy has essentially resolved.  Reproductive: Unremarkable  Other: Mild presacral edema, nonspecific and possibly treatment related.  Musculoskeletal: Unremarkable  IMPRESSION: 1. Notable reduction in size of the right eccentric bladder tumor, although not completely resolved. The prior lower abdominal and pelvic adenopathy has essentially resolved since the prior PET-CT. 2. Sclerosis and anterior cortical irregularity in the upper sternal body, along with some mild superior endplate wedging at T1, T2, and T3. All these findings are subtle enough that I am not sure if they were present on the prior PET-CT or not. Correlate with any history of trauma. The sternal lesion is nonspecific although most  likely posttraumatic. 3. Mild wall thickening along the posterior wall of the rectum, nonspecific. Correlation with the patient's colon cancer screening history is recommended. If screening is not up-to-date, appropriate screening should be considered. 4. Bosniak category 39F cyst of the right kidney lower pole with multiple faint septations with subtle calcifications. This lesion is been stable since the earliest available comparison of January 2020. If not otherwise dimension in the context of patient's bladder cancer surveillance, I would recommend a follow up CT or MRI using renal protocol in 6 months time. This recommendation follows ACR consensus guidelines: Management of the Incidental Renal Mass on CT: A White Paper of the ACR Incidental Findings Committee. J Am Coll Radiol [phone]. 5. Other imaging findings of potential clinical significance: Aortic Atherosclerosis (ICD10-I70.0). Coronary atherosclerosis. Small type 1 hiatal hernia.  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   Impression and Plan:  68 year old man with:  1.  Stage IV bladder cancer with pelvic adenopathy diagnosed in January 2020.    He is status post 5 cycles of chemotherapy concluded end of April 2020.  He is recovering slowly with delayed complications related to chemotherapy.  CT scan on Oct 23, 2018 was personally reviewed continues to show positive response to therapy.  He has a notable decrease in his tumor in the bladder with resolution of his disease outside of the bladder.  The natural course of this disease as well as treatment options were discussed today.  Definitive curative option of radical cystectomy is still an option given his complete response.  Alternatively, palliative systemic therapy would offer her disease control but no cure.  Although he is high risk of relapse disease, I will be in favor of proceeding with salvage cystectomy for  curative intent.  He has appointment with Dr. Gloriann Loan for evaluation regarding this issue in the near future.  2.  IV access: Port-A-Cath will remain in place and flushed periodically.  3.  Antiemetics: Antiemetics are available to him.  I anticipate continuous improvement in nausea moving forward.  4.  Renal function surveillance: Kidney function has stabilized at this time without any worsening issues.   5.  Goals of care: His disease is potentially curable and aggressive therapy is warranted.  6.  Anemia: His hemoglobin continues to improve at this time without any transfusion.  7.  Follow-up: We will be in 2 months to follow his progress.  25  minutes was spent with the patient face-to-face today.  More than 50% of time was dedicated to reviewing his disease status, imaging studies and answering questions regarding future plan of care.    Zola Button, MD 6/2/20209:17 AM

## 2018-10-29 ENCOUNTER — Other Ambulatory Visit: Payer: Self-pay | Admitting: Oncology

## 2018-10-29 ENCOUNTER — Telehealth: Payer: Self-pay | Admitting: Oncology

## 2018-10-29 NOTE — Telephone Encounter (Signed)
Scheduled appt per sch msg. Called and left msg.  °

## 2018-10-31 ENCOUNTER — Emergency Department (HOSPITAL_COMMUNITY): Payer: Medicare Other

## 2018-10-31 ENCOUNTER — Other Ambulatory Visit: Payer: Self-pay

## 2018-10-31 ENCOUNTER — Emergency Department (HOSPITAL_COMMUNITY)
Admission: EM | Admit: 2018-10-31 | Discharge: 2018-10-31 | Disposition: A | Discharge status: Home / Self Care | Payer: Medicare Other | Attending: Emergency Medicine | Admitting: Emergency Medicine

## 2018-10-31 ENCOUNTER — Telehealth: Payer: Self-pay

## 2018-10-31 DIAGNOSIS — Z87891 Personal history of nicotine dependence: Secondary | ICD-10-CM | POA: Diagnosis not present

## 2018-10-31 DIAGNOSIS — Z7984 Long term (current) use of oral hypoglycemic drugs: Secondary | ICD-10-CM | POA: Insufficient documentation

## 2018-10-31 DIAGNOSIS — Y9389 Activity, other specified: Secondary | ICD-10-CM | POA: Diagnosis not present

## 2018-10-31 DIAGNOSIS — S2242XA Multiple fractures of ribs, left side, initial encounter for closed fracture: Secondary | ICD-10-CM

## 2018-10-31 DIAGNOSIS — W01198A Fall on same level from slipping, tripping and stumbling with subsequent striking against other object, initial encounter: Secondary | ICD-10-CM | POA: Insufficient documentation

## 2018-10-31 DIAGNOSIS — I951 Orthostatic hypotension: Secondary | ICD-10-CM

## 2018-10-31 DIAGNOSIS — Z8551 Personal history of malignant neoplasm of bladder: Secondary | ICD-10-CM | POA: Diagnosis not present

## 2018-10-31 DIAGNOSIS — Y998 Other external cause status: Secondary | ICD-10-CM | POA: Diagnosis not present

## 2018-10-31 DIAGNOSIS — Y92002 Bathroom of unspecified non-institutional (private) residence single-family (private) house as the place of occurrence of the external cause: Secondary | ICD-10-CM | POA: Insufficient documentation

## 2018-10-31 DIAGNOSIS — S0990XA Unspecified injury of head, initial encounter: Secondary | ICD-10-CM

## 2018-10-31 DIAGNOSIS — Z79899 Other long term (current) drug therapy: Secondary | ICD-10-CM | POA: Insufficient documentation

## 2018-10-31 DIAGNOSIS — R55 Syncope and collapse: Secondary | ICD-10-CM

## 2018-10-31 DIAGNOSIS — E119 Type 2 diabetes mellitus without complications: Secondary | ICD-10-CM | POA: Insufficient documentation

## 2018-10-31 LAB — COMPREHENSIVE METABOLIC PANEL
ALT: 16 U/L (ref 0–44)
AST: 18 U/L (ref 15–41)
Albumin: 4 g/dL (ref 3.5–5.0)
Alkaline Phosphatase: 59 U/L (ref 38–126)
Anion gap: 8 (ref 5–15)
BUN: 41 mg/dL — ABNORMAL HIGH (ref 8–23)
CO2: 24 mmol/L (ref 22–32)
Calcium: 9.1 mg/dL (ref 8.9–10.3)
Chloride: 105 mmol/L (ref 98–111)
Creatinine, Ser: 1.51 mg/dL — ABNORMAL HIGH (ref 0.61–1.24)
GFR calc Af Amer: 53 mL/min — ABNORMAL LOW (ref >60)
GFR calc non Af Amer: 46 mL/min — ABNORMAL LOW (ref >60)
Glucose, Bld: 166 mg/dL — ABNORMAL HIGH (ref 70–99)
Potassium: 4.7 mmol/L (ref 3.5–5.1)
Sodium: 137 mmol/L (ref 135–145)
Total Bilirubin: 0.4 mg/dL (ref 0.3–1.2)
Total Protein: 6.5 g/dL (ref 6.5–8.1)

## 2018-10-31 LAB — CBC WITH DIFFERENTIAL/PLATELET
Abs Immature Granulocytes: 0.1 10*3/uL — ABNORMAL HIGH (ref 0.00–0.07)
Basophils Absolute: 0.1 10*3/uL (ref 0.0–0.1)
Basophils Relative: 0 %
Eosinophils Absolute: 0.2 10*3/uL (ref 0.0–0.5)
Eosinophils Relative: 1 %
HCT: 30.1 % — ABNORMAL LOW (ref 39.0–52.0)
Hemoglobin: 10.2 g/dL — ABNORMAL LOW (ref 13.0–17.0)
Immature Granulocytes: 1 %
Lymphocytes Relative: 8 %
Lymphs Abs: 1 10*3/uL (ref 0.7–4.0)
MCH: 34.6 pg — ABNORMAL HIGH (ref 26.0–34.0)
MCHC: 33.9 g/dL (ref 30.0–36.0)
MCV: 102 fL — ABNORMAL HIGH (ref 80.0–100.0)
Monocytes Absolute: 0.9 10*3/uL (ref 0.1–1.0)
Monocytes Relative: 7 %
Neutro Abs: 10.1 10*3/uL — ABNORMAL HIGH (ref 1.7–7.7)
Neutrophils Relative %: 83 %
Platelets: 162 10*3/uL (ref 150–400)
RBC: 2.95 MIL/uL — ABNORMAL LOW (ref 4.22–5.81)
RDW: 13.5 % (ref 11.5–15.5)
WBC: 12.3 10*3/uL — ABNORMAL HIGH (ref 4.0–10.5)
nRBC: 0 % (ref 0.0–0.2)

## 2018-10-31 LAB — URINALYSIS, ROUTINE W REFLEX MICROSCOPIC
Bilirubin Urine: NEGATIVE
Glucose, UA: NEGATIVE mg/dL
Hgb urine dipstick: NEGATIVE
Ketones, ur: NEGATIVE mg/dL
Leukocytes,Ua: NEGATIVE
Nitrite: NEGATIVE
Protein, ur: NEGATIVE mg/dL
Specific Gravity, Urine: 1.017 (ref 1.005–1.030)
pH: 5 (ref 5.0–8.0)

## 2018-10-31 LAB — CBG MONITORING, ED: Glucose-Capillary: 159 mg/dL — ABNORMAL HIGH (ref 70–99)

## 2018-10-31 LAB — TROPONIN I: Troponin I: <0.03 ng/mL (ref <0.03)

## 2018-10-31 MED ORDER — SODIUM CHLORIDE 0.9 % IV BOLUS (SEPSIS)
1000.0000 mL | Freq: Once | INTRAVENOUS | Status: AC
  Administered 2018-10-31: 05:00:00 1000 mL via INTRAVENOUS

## 2018-10-31 MED ORDER — ACETAMINOPHEN 500 MG PO TABS
1000.0000 mg | ORAL_TABLET | Freq: Once | ORAL | Status: AC
  Administered 2018-10-31: 1000 mg via ORAL
  Filled 2018-10-31: qty 2

## 2018-10-31 MED ORDER — SODIUM CHLORIDE 0.9 % IV BOLUS (SEPSIS)
1000.0000 mL | Freq: Once | INTRAVENOUS | Status: AC
  Administered 2018-10-31: 07:00:00 1000 mL via INTRAVENOUS

## 2018-10-31 NOTE — ED Triage Notes (Signed)
Pt reports via EMS after getting up to use the bathroom and had a witnessed syncopal episode. Hx of bladder cancer with last chemo 1 month ago. Was diaphoretic and pale, 1 episode of vomiting after standing up with medic. After vomiting pt experienced left sided rib/chest pain that has gotten better. Fluid given by medic. Pt improved. This has happened before with dehydration.

## 2018-10-31 NOTE — ED Notes (Signed)
Discharge instructions reviewed with patient. Patient verbalizes understanding. VSS.   

## 2018-10-31 NOTE — ED Provider Notes (Signed)
  Physical Exam  BP 136/62   Pulse 64   Temp 97.8 F (36.6 C) (Oral)   Resp 18   Ht 6' (1.829 m)   Wt 77.1 kg   SpO2 95%   BMI 23.06 kg/m   Physical Exam  ED Course/Procedures     Procedures  MDM  Patient feels somewhat better.  Still some chest pain.  Discussed with him patient is eager to go home.  Has been ambulatory.  Urine does not show infection.  Has pain medicines at home.  Aware of risks.  Will discharge.       Davonna Belling, MD 10/31/18 [phone]

## 2018-10-31 NOTE — Discharge Instructions (Addendum)
Please use your incentive spirometer every 1-2 hours while awake for the next 2 weeks.  This will help prevent pneumonia.  I recommend that you increase your fluid intake at home.  You may take your pain medication at home as prescribed.  Some of these pain medications have Tylenol in them.  Please do not take more than 4000 mg of Tylenol in a 24-hour period.  Please follow-up with your primary care physician in 1 week to have your blood pressure rechecked.

## 2018-10-31 NOTE — ED Notes (Signed)
Pt ambulated in hallway with minimal assist and a steady gait.

## 2018-10-31 NOTE — ED Notes (Signed)
Pt given and verbalized understanding of how to use an incentive spirometer. He knows why and how to use it and was able to demonstrate appropriately with the teach back method

## 2018-10-31 NOTE — ED Notes (Signed)
Patient transported to CT 

## 2018-10-31 NOTE — ED Notes (Addendum)
Pt is aware a urine specimen is needed and knows to call staff when able to void. Pt knows to not stand by himself and has call bell within reach

## 2018-10-31 NOTE — Telephone Encounter (Signed)
PA initiated via Covermymeds; KEY: [id number]. Awaiting determination.

## 2018-10-31 NOTE — ED Provider Notes (Signed)
TIME SEEN: 4:31 AM  CHIEF COMPLAINT: Syncope, chest pain, head injury  HPI: Patient is a 68 year old male with history of bladder cancer on chemotherapy, diabetes, hyperlipidemia who presents to the emergency department after a syncopal event.  States he got up from bed to go the bathroom and his knees felt "rubbery" and he fell down to the ground.  States he struck his head on the toilet and may have hit the left side of his chest as well as he is now having left-sided achy chest pain after his fall.  States he lost consciousness for a few seconds.  Denies neck or back pain.  No numbness, tingling or focal weakness.  Denies fevers, cough, shortness of breath, vomiting, diarrhea, bloody stools, melena.  No numbness or focal weakness.  States when he had similar symptoms he was dehydrated.  ROS: See HPI Constitutional: no fever  Eyes: no drainage  ENT: no runny nose   Cardiovascular: Left achy chest pain  Resp: no SOB  GI: no vomiting; + constipation GU: no dysuria Integumentary: no rash  Allergy: no hives  Musculoskeletal: no leg swelling  Neurological: no slurred speech ROS otherwise negative  PAST MEDICAL HISTORY/PAST SURGICAL HISTORY:  Past Medical History:  Diagnosis Date  . Bladder cancer ([place])   . Cellulitis    Left leg   . Constipation   . Diabetes mellitus without complication ([place])   . Diabetic neuropathy ([place])    Feet  . Hematuria 04/21/2018  . Hyperlipidemia   . 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 Vasculitis ([place])    Left leg    MEDICATIONS:  Prior to Admission medications   Medication Sig Start Date End Date Taking? Authorizing Provider  Acetaminophen (TYLENOL PO) Take by mouth every 6 (six) hours as needed.    [provider]  Cholecalciferol (VITAMIN D3) 125 MCG (5000 UT) CAPS Take 5,000 Units by mouth daily.    [provider]  Cyanocobalamin (VITAMIN B-12) 5000 MCG TBDP Take 5,000 mcg by mouth daily.      [provider]  Digestive Enzymes (DIGESTIVE ENZYME PO) Take 1 capsule by mouth 2 (two) times daily.     [provider]  diphenhydrAMINE (BENADRYL) 25 MG tablet Take 25 mg by mouth at bedtime.    [provider]  Emollient (CERAVE) CREA Apply 1 application topically 2 (two) times daily.    [provider]  HYDROcodone-acetaminophen (NORCO/VICODIN) 5-325 MG tablet Take 1 tablet by mouth every 4 (four) hours as needed for moderate pain. 06/16/18 06/16/19  Lucas Mallow, MD  lidocaine-prilocaine (EMLA) cream Apply 1 application topically as needed. 06/17/18   Wyatt Portela, MD  LORazepam (ATIVAN) 0.5 MG tablet Place 1 tablet (0.5 mg total) under the tongue every 8 (eight) hours as needed (nausea). 09/26/18   Tanner, Lucianne Lei E., PA-C  magnesium oxide (MAG-OX) 400 MG tablet TAKE 1 TABLET BY MOUTH EVERY DAY 10/29/18   Wyatt Portela, MD  Menaquinone-7 (VITAMIN K2 PO) Take 50 mcg by mouth daily.     [provider]  OVER THE COUNTER MEDICATION Take 240 mLs by mouth daily. Asca Liquid Supplement    [provider]  OVER THE COUNTER MEDICATION 1,000 mg 2 (two) times daily. Graviola    [provider]  OVER THE COUNTER MEDICATION CTFO super fruit chew    [provider]  OVER THE COUNTER MEDICATION Alive Men's 50+ gummy    [provider]  OVER THE COUNTER MEDICATION Elderberry syrup with turmeric    [provider]  prochlorperazine (COMPAZINE) 10 MG tablet TAKE 1 TABLET BY MOUTH EVERY 6 HOURS AS NEEDED FOR NAUSEA OR VOMITING. 10/17/18   Wyatt Portela, MD  senna-docusate (SENNA S) 8.6-50 MG tablet Take 1 tablet by mouth 2 (two) times daily. 10/08/18   Wyatt Portela, MD  Sennosides-Docusate Sodium (STOOL SOFTENER & LAXATIVE PO) Take by mouth 2 (two) times daily.    [provider]  sitaGLIPtin (JANUVIA) 100 MG tablet Take 1 tablet (100 mg total) by mouth daily. 10/23/18   [name], [name], MD  tamsulosin  (FLOMAX) 0.4 MG CAPS capsule Take 0.4 mg by mouth daily. 06/13/18   [provider]  tolnaftate (TINACTIN) 1 % spray Apply 1 spray topically daily as needed (for feet itching).    [provider]  triamcinolone cream (KENALOG) 0.1 % APPLY TO AFFECTED AREA TWICE A DAY Patient taking differently: Apply 1 application topically 2 (two) times daily as needed (for itching).  04/10/18   [name], [name], MD  UNABLE TO FIND 2 (two) times daily as needed (for constipation). Triphala    [provider]  UNABLE TO FIND 1,000 mg 3 (three) times daily. Med Name:AHCC    [provider]  Zinc 30 MG TABS Take by mouth daily.    [provider]    ALLERGIES:  Allergies  Allergen Reactions  . Sulfa Antibiotics Hives    SOCIAL HISTORY:  Social History   Tobacco Use  . Smoking status: Former Research scientist (life sciences)  . Smokeless tobacco: Never Used  Substance Use Topics  . Alcohol use: Not Currently    Alcohol/week: 1.0 standard drinks    Types: 1 Glasses of wine per week    FAMILY HISTORY: No family history on file.  EXAM: BP (!) 142/62 (BP Location: Left Arm)   Pulse 71   Temp 97.8 F (36.6 C) (Oral)   Resp 17   Ht 6' (1.829 m)   Wt 77.1 kg   SpO2 98%   BMI 23.06 kg/m  CONSTITUTIONAL: Alert and oriented and responds appropriately to questions.  Chronically ill-appearing HEAD: Normocephalic, appears atraumatic EYES: Conjunctivae clear, pupils appear equal, EOMI, no conjunctival pallor ENT: normal nose; moist mucous membranes NECK: Supple, no meningismus, no nuchal rigidity, no LAD, no midline spinal tenderness or step-off or deformity CARD: RRR; S1 and S2 appreciated; no murmurs, no clicks, no rubs, no gallops CHEST:  Chest wall is tender to palpation over the left anterior lateral lower chest wall.  No crepitus, ecchymosis, erythema, warmth, rash or other lesions present. RESP: Normal chest excursion without splinting or tachypnea; breath sounds clear and  equal bilaterally; no wheezes, no rhonchi, no rales, no hypoxia or respiratory distress, speaking full sentences ABD/GI: Normal bowel sounds; non-distended; soft, non-tender, no rebound, no guarding, no peritoneal signs, no hepatosplenomegaly BACK:  The back appears normal and is non-tender to palpation, there is no CVA tenderness, no midline spinal tenderness or step-off or deformity EXT: Normal ROM in all joints; non-tender to palpation; no edema; normal capillary refill; no cyanosis, no calf tenderness or swelling    SKIN: Normal color for age and race; warm; no rash NEURO: Moves all extremities equally, strength 5/5 all 4 extremities, sensation to light touch intact diffusely, cranial nerves II through XII intact, normal speech PSYCH: The patient's mood and manner are appropriate. Grooming and personal hygiene are appropriate.  MEDICAL DECISION MAKING: Patient here after syncopal  event.  States he feels like he may be dehydrated.  Last chemotherapy was 1 month ago.  States that the plan is to hold on further chemotherapy at this time and to have a cystectomy.  He states he did hit his head when he passed out.  Will obtain CT of the head.  Also complaining of left-sided rib pain.  This seems to be more musculoskeletal in nature as it is worse with movement and palpation.  EKG shows no ischemic change.  Will obtain left rib x-ray but also check troponin.  Will check blood counts for signs of anemia.  Will check electrolytes.  We will continue IV hydration.  He declines pain medication at this time.  ED PROGRESS: Patient is orthostatic despite receiving approximately 1 L of IV fluids.  Have ordered a second liter of fluid.  His labs show mild leukocytosis which is likely reactive.  Creatinine elevated at 1.51 which is chronic.  Troponin is negative.  Head CT shows no acute abnormality.  Chest x-ray shows anterior left sixth and seventh rib fractures without pneumothorax.  Will provide incentive  spirometer.  Urinalysis pending.   6:30 AM  Pt reports that he only wants Tylenol for pain.  He states he has hydrocodone and oxycodone at home and does not need a prescription.  Have offered him admission which he declines.  He feels comfortable going home with strict instructions for pulmonary toilet at home with incentive spirometry and follow-up if he has worsening symptoms.  We discussed at length return precautions.  Recommended increase fluid intake.  He will receive a second liter of IV fluid and afterwards obtain urinalysis.  He has been able to ambulate here with a steady gait.  Will sign out to oncoming ED physician to follow-up on patient's urine results and recheck blood pressure with standing after further hydration.  I reviewed all nursing notes, vitals, pertinent previous records, EKGs, lab and urine results, imaging (as available).   EKG Interpretation  Date/Time:  Friday October 31 2018 04:21:30 EDT Ventricular Rate:  68 PR Interval:    QRS Duration: 103 QT Interval:  400 QTC Calculation: 426 R Axis:   -10 Text Interpretation:  Sinus rhythm RSR' in V1 or V2, right VCD or RVH No significant change since last tracing Confirmed by Aarini Slee, Cyril Mourning [phone]) on 10/31/2018 4:58:37 AM         Amery Vandenbos, Delice Bison, DO 10/31/18 [id number]

## 2018-10-31 NOTE — ED Notes (Signed)
Bed: QW38 Expected date:  Expected time:  Means of arrival:  Comments: EMS cancer pt, left chest pain

## 2018-11-02 ENCOUNTER — Encounter: Payer: Self-pay | Admitting: Family Medicine

## 2018-11-02 DIAGNOSIS — C678 Malignant neoplasm of overlapping sites of bladder: Secondary | ICD-10-CM

## 2018-11-02 DIAGNOSIS — I951 Orthostatic hypotension: Secondary | ICD-10-CM

## 2018-11-02 DIAGNOSIS — S2249XA Multiple fractures of ribs, unspecified side, initial encounter for closed fracture: Secondary | ICD-10-CM

## 2018-11-03 NOTE — Telephone Encounter (Signed)
PA approved.

## 2018-11-04 ENCOUNTER — Telehealth: Payer: Self-pay | Admitting: Family Medicine

## 2018-11-04 ENCOUNTER — Other Ambulatory Visit: Payer: Self-pay | Admitting: Oncology

## 2018-11-04 NOTE — Telephone Encounter (Signed)
Copied from [place] [id number]. Topic: Quick Communication - Home Health Verbal Orders >> Nov 04, 2018  1:17 PM Sheran Luz wrote: Caller/Agency: Constance [facility]  Callback Number: [phone] Requesting PT Frequency: 2w2, 1w2  VM ok.

## 2018-11-04 NOTE — Telephone Encounter (Signed)
Verbal orders given vis voicemail.

## 2018-11-04 NOTE — Progress Notes (Addendum)
[place] [facility] [address], [place], [place] [address] [phone]  Date:  11/06/2018   Name:  Sergio Pennington   DOB:  May 14, 1951   MRN:  [id number]  PCP:  Darreld Mclean, MD    Chief Complaint: Fall and ER follow up (cracked ribs)   History of Present Illness:  Sergio Pennington is a 68 y.o. very pleasant male patient who presents with the following:  In person follow-up visit today We had a virtual visit about 2 weeks ago, otherwise I had not seen this gentleman since January 2019 In the interim, he was unfortunately diagnosed with bladder cancer, stage IV in January of this year I have been following him for diabetes, and was concerned about his borderline renal function.  However, it has improved some since he completed chemo and he is able to continue metformin at this time  He had surgical resection of tumor on January 28, and completed 5 cycles of chemotherapy He was seen by oncology on 6/2.  At that time his recent CT scan showed good response to therapy, decrease in bladder tumor and resolution of disease outside the bladder Per patient and his wife, the next step is to remove the bladder entirely and some lymph nodes-this is planned for a few weeks from now.  He was seen in the ER on 6/5, with orthostatic hypotension and associated syncope He had risen from bed to go to the bathroom, and had a syncopal events.  He hit his head on the toilet when he fell.  The ER performed a CT of his head which was negative, and also rib x-rays which did show 2 rib fractures.  He was hydrated and released to home in improved condition  Of note his renal function was again a little bit worse in the ER, with GFR at 46.  Mild anemia is stable/improved  I placed a referral to home health for PT and OT He is feeling "a little bit weak but less so now that the days go by" He had been constipated but they got  some miralax and this worked  Pain in his ribs in up to a max of 5/ 10 with movement, at rest it is 1-2/10.  He feels that this is improving. He has not felt pre-syncopal since the ER He will feel cold and start shivering easily but otherwise has no acute complaints except for urinary frequency  Currently taking Metformin 500 BID for diabetes  He is using his spirometer as instructed by the ER He declines pneumonia vaccine today  Accompanied by his wife today who contributes to the history   BP Readings from Last 3 Encounters:  11/06/18 130/80  10/31/18 140/65  10/28/18 140/69    Patient Active Problem List   Diagnosis Date Noted  . Port-A-Cath in place 07/03/2018  . Bladder cancer ([place]) 06/17/2018  . DIAB W/O MENTION COMP TYPE II/UNS TYPE UNCNTRL 03/08/2008  . DIABETES MELLITUS, TYPE II, UNCONTROLLED, W/NEUROLO COMPS 03/08/2008  . HYPERLIPIDEMIA 03/08/2008  . TOBACCO ABUSE 03/08/2008    Past Medical History:  Diagnosis Date  . Bladder cancer ([place])   . Cellulitis    Left leg   . Constipation   . Diabetes mellitus without complication ([place])   . Diabetic neuropathy ([place])    Feet  . Hematuria 04/21/2018  . Hyperlipidemia   . 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  Vasculitis ([place])    Left leg    Past Surgical History:  Procedure Laterality Date  . IR IMAGING GUIDED PORT INSERTION  06/26/2018  . MOUTH SURGERY  1980  . TONSILLECTOMY     4th grade  . TRANSURETHRAL RESECTION OF BLADDER TUMOR N/A 06/16/2018   Procedure: TRANSURETHRAL RESECTION OF BLADDER TUMOR (TURBT);  Surgeon: Lucas Mallow, MD;  Location: [facility];  Service: Urology;  Laterality: N/A;    Social History   Tobacco Use  . Smoking status: Former Research scientist (life sciences)  . Smokeless tobacco: Never Used  Substance Use Topics  . Alcohol use: Not Currently    Alcohol/week: 1.0 standard drinks    Types: 1 Glasses of wine per week  . Drug use: Not Currently    No family history on  file.  Allergies  Allergen Reactions  . Sulfa Antibiotics Hives    Medication list has been reviewed and updated.  Current Outpatient Medications on File Prior to Visit  Medication Sig Dispense Refill  . Acetaminophen (TYLENOL PO) Take by mouth every 6 (six) hours as needed (pain).     . Cholecalciferol (VITAMIN D3) 125 MCG (5000 UT) CAPS Take 5,000 Units by mouth daily.    . Cyanocobalamin (VITAMIN B-12) 5000 MCG TBDP Take 5,000 mcg by mouth daily.     . Digestive Enzymes (DIGESTIVE ENZYME PO) Take 1 capsule by mouth 2 (two) times daily.     . diphenhydrAMINE (BENADRYL) 25 MG tablet Take 25 mg by mouth at bedtime.    . Emollient (CERAVE) CREA Apply 1 application topically 2 (two) times daily.    Marland Kitchen lidocaine-prilocaine (EMLA) cream Apply 1 application topically as needed. 30 g 0  . LORazepam (ATIVAN) 0.5 MG tablet Place 1 tablet (0.5 mg total) under the tongue every 8 (eight) hours as needed (nausea). 30 tablet 0  . magnesium oxide (MAG-OX) 400 MG tablet TAKE 1 TABLET BY MOUTH EVERY DAY (Patient taking differently: Take 400 mg by mouth daily. ) 30 tablet 0  . OVER THE COUNTER MEDICATION Take 240 mLs by mouth daily. Asca Liquid Supplement    . OVER THE COUNTER MEDICATION 1,000 mg 2 (two) times daily. Graviola    . OVER THE COUNTER MEDICATION CTFO super fruit chew    . OVER THE COUNTER MEDICATION Alive Men's 50+ gummy    . OVER THE COUNTER MEDICATION Elderberry syrup with turmeric    . polyethylene glycol powder (MIRALAX) 17 GM/SCOOP powder Take 1 Container by mouth once.    . prochlorperazine (COMPAZINE) 10 MG tablet TAKE 1 TABLET BY MOUTH EVERY 6 HOURS AS NEEDED FOR NAUSEA OR VOMITING. 30 tablet 0  . senna-docusate (SENNA S) 8.6-50 MG tablet Take 1 tablet by mouth 2 (two) times daily. 90 tablet 3  . tamsulosin (FLOMAX) 0.4 MG CAPS capsule Take 0.4 mg by mouth daily.    Marland Kitchen tolnaftate (TINACTIN) 1 % spray Apply 1 spray topically daily as needed (for feet itching).    . triamcinolone cream  (KENALOG) 0.1 % APPLY TO AFFECTED AREA TWICE A DAY (Patient taking differently: Apply 1 application topically 2 (two) times daily as needed (for itching). ) 80 g 1  . UNABLE TO FIND 2 (two) times daily as needed (for constipation). Triphala    . UNABLE TO FIND 1,000 mg 3 (three) times daily. Med Name:AHCC    . Zinc 30 MG TABS Take 1 tablet by mouth daily.     . sitaGLIPtin (JANUVIA) 100 MG tablet Take 1 tablet (100 mg  total) by mouth daily. (Patient not taking: Reported on 11/06/2018) 30 tablet 6   No current facility-administered medications on file prior to visit.     Review of Systems:  As per HPI- otherwise negative. No fever  Physical Examination: Vitals:   11/06/18 1311  BP: 130/80  Pulse: 87  Resp: 16  Temp: 98 F (36.7 C)  SpO2: 97%   Vitals:   11/06/18 1311  Weight: 169 lb 12.8 oz (77 kg)  Height: 6' (1.829 m)   Body mass index is 23.03 kg/m. Ideal Body Weight: Weight in (lb) to have BMI = 25: 183.9  GEN: WDWN, NAD, Non-toxic, A & O x 3, looks sightly pale but overall well  HEENT: Atraumatic, Normocephalic. Neck supple. No masses, No LAD. Ears and Nose: No external deformity. CV: RRR, No M/G/R. No JVD. No thrill. No extra heart sounds. Palpated the left ribs at the site of fracture, he has just mild tenderness PULM: CTA B, no wheezes, crackles, rhonchi. No retractions. No resp. distress. No accessory muscle use. ABD: S, NT, ND, +BS. No rebound. No HSM.  Belly is benign today EXTR: No c/c/e NEURO Normal gait.  PSYCH: Normally interactive. Conversant. Not depressed or anxious appearing.  Calm demeanor.    Assessment and Plan: Hospital discharge follow-up  Diabetes mellitus type 2, uncontrolled, without complications ([place]) - Plan: Basic metabolic panel, Hemoglobin A1c, Microalbumin / creatinine urine ratio  Malignant neoplasm of overlapping sites of bladder (HCC)  Urinary frequency - Plan: Urine Culture  Closed fracture of multiple ribs of left side, initial  encounter  Following up from recent ER visit today. [facility]) was seen in the ER recently after he fainted and broke 2 ribs.  He is healing and seems to be doing okay. He has noted some urinary frequency, would like to make sure no UTI.  We will do a urine culture, will also obtain A1c, BMP, urine protein  He is asked to alert me if any worsening, fever, or any other concerns  Follow-up: No follow-ups on file.  No orders of the defined types were placed in this encounter.  Orders Placed This Encounter  Procedures  . Urine Culture  . Basic metabolic panel  . Hemoglobin A1c  . Microalbumin / creatinine urine ratio        Signed Lamar Blinks, MD  Received his labs so far as follows:  Results for orders placed or performed in visit on 81/82/99  Basic metabolic panel  Result Value Ref Range   Sodium 138 135 - 145 mEq/L   Potassium 4.9 3.5 - 5.1 mEq/L   Chloride 98 96 - 112 mEq/L   CO2 29 19 - 32 mEq/L   Glucose, Bld 110 (H) 70 - 99 mg/dL   BUN 32 (H) 6 - 23 mg/dL   Creatinine, Ser 1.43 0.40 - 1.50 mg/dL   Calcium 9.6 8.4 - 10.5 mg/dL   GFR 48.84 (L) >60.00 mL/min  Hemoglobin A1c  Result Value Ref Range   Hgb A1c MFr Bld 5.1 4.6 - 6.5 %  Microalbumin / creatinine urine ratio  Result Value Ref Range   Microalb, Ur 47.5 (H) 0.0 - 1.9 mg/dL   Creatinine,U 182.1 mg/dL   Microalb Creat Ratio 26.1 0.0 - 30.0 mg/g   Message to pt   Received urine culture on 6/13- message to pt

## 2018-11-06 ENCOUNTER — Encounter: Payer: Self-pay | Admitting: Family Medicine

## 2018-11-06 ENCOUNTER — Other Ambulatory Visit: Payer: Self-pay

## 2018-11-06 ENCOUNTER — Ambulatory Visit (INDEPENDENT_AMBULATORY_CARE_PROVIDER_SITE_OTHER): Payer: Medicare Other | Admitting: Family Medicine

## 2018-11-06 VITALS — BP 130/80 | HR 87 | Temp 98.0°F | Resp 16 | Ht 72.0 in | Wt 169.8 lb

## 2018-11-06 DIAGNOSIS — R350 Frequency of micturition: Secondary | ICD-10-CM

## 2018-11-06 DIAGNOSIS — E1165 Type 2 diabetes mellitus with hyperglycemia: Secondary | ICD-10-CM

## 2018-11-06 DIAGNOSIS — IMO0001 Reserved for inherently not codable concepts without codable children: Secondary | ICD-10-CM

## 2018-11-06 DIAGNOSIS — C678 Malignant neoplasm of overlapping sites of bladder: Secondary | ICD-10-CM | POA: Diagnosis not present

## 2018-11-06 DIAGNOSIS — S2242XA Multiple fractures of ribs, left side, initial encounter for closed fracture: Secondary | ICD-10-CM

## 2018-11-06 DIAGNOSIS — Z09 Encounter for follow-up examination after completed treatment for conditions other than malignant neoplasm: Secondary | ICD-10-CM

## 2018-11-06 LAB — MICROALBUMIN / CREATININE URINE RATIO
Creatinine,U: 182.1 mg/dL
Microalb Creat Ratio: 26.1 mg/g (ref 0.0–30.0)
Microalb, Ur: 47.5 mg/dL — ABNORMAL HIGH (ref 0.0–1.9)

## 2018-11-06 LAB — HEMOGLOBIN A1C: Hgb A1c MFr Bld: 5.1 % (ref 4.6–6.5)

## 2018-11-06 LAB — BASIC METABOLIC PANEL
BUN: 32 mg/dL — ABNORMAL HIGH (ref 6–23)
CO2: 29 mEq/L (ref 19–32)
Calcium: 9.6 mg/dL (ref 8.4–10.5)
Chloride: 98 mEq/L (ref 96–112)
Creatinine, Ser: 1.43 mg/dL (ref 0.40–1.50)
GFR: 48.84 mL/min — ABNORMAL LOW (ref >60.00)
Glucose, Bld: 110 mg/dL — ABNORMAL HIGH (ref 70–99)
Potassium: 4.9 mEq/L (ref 3.5–5.1)
Sodium: 138 mEq/L (ref 135–145)

## 2018-11-06 NOTE — Patient Instructions (Signed)
Great to see you today- take care and I will be in touch with your labs asap If you have any concerns or start feeling worse please alert me Please use your spirometer to prevent pneumonia.    Take care!

## 2018-11-07 ENCOUNTER — Telehealth: Payer: Self-pay | Admitting: Family Medicine

## 2018-11-07 ENCOUNTER — Other Ambulatory Visit: Payer: Self-pay | Admitting: Urology

## 2018-11-07 LAB — URINE CULTURE
MICRO NUMBER:: 560204
Result:: NO GROWTH
SPECIMEN QUALITY:: ADEQUATE

## 2018-11-07 NOTE — Telephone Encounter (Signed)
Copied from [place] [id number]. Topic: Quick Communication - Home Health Verbal Orders >> Nov 07, 2018  4:21 PM Blase Mess A wrote: Caller/Agency: [facility] [place] Number: [phone] Requesting OT/PT/Skilled Nursing/Social Work/Speech Therapy: OT Frequency: 2x a week for 2 week,

## 2018-11-10 NOTE — Telephone Encounter (Signed)
Spoke w/ Will- verbal orders given.

## 2018-11-20 ENCOUNTER — Other Ambulatory Visit: Payer: Self-pay | Admitting: Oncology

## 2018-11-22 ENCOUNTER — Other Ambulatory Visit (HOSPITAL_COMMUNITY)
Admission: RE | Admit: 2018-11-22 | Discharge: 2018-11-22 | Disposition: A | Discharge status: Home / Self Care | Payer: Medicare Other | Source: Ambulatory Visit | Attending: Urology | Admitting: Urology

## 2018-11-22 DIAGNOSIS — Z1159 Encounter for screening for other viral diseases: Secondary | ICD-10-CM | POA: Diagnosis present

## 2018-11-22 LAB — SARS CORONAVIRUS 2 (TAT 6-24 HRS): SARS Coronavirus 2: NEGATIVE

## 2018-11-25 ENCOUNTER — Other Ambulatory Visit: Payer: Self-pay

## 2018-11-25 ENCOUNTER — Inpatient Hospital Stay (HOSPITAL_COMMUNITY)
Admission: AD | Admit: 2018-11-25 | Discharge: 2018-12-02 | DRG: 654 | Disposition: A | Discharge status: Home Health | Payer: Medicare Other | Source: Ambulatory Visit | Attending: Urology | Admitting: Urology

## 2018-11-25 DIAGNOSIS — N179 Acute kidney failure, unspecified: Secondary | ICD-10-CM | POA: Diagnosis not present

## 2018-11-25 DIAGNOSIS — C7989 Secondary malignant neoplasm of other specified sites: Secondary | ICD-10-CM | POA: Diagnosis present

## 2018-11-25 DIAGNOSIS — D62 Acute posthemorrhagic anemia: Secondary | ICD-10-CM | POA: Diagnosis not present

## 2018-11-25 DIAGNOSIS — C679 Malignant neoplasm of bladder, unspecified: Principal | ICD-10-CM | POA: Diagnosis present

## 2018-11-25 DIAGNOSIS — Z882 Allergy status to sulfonamides status: Secondary | ICD-10-CM

## 2018-11-25 DIAGNOSIS — Z87891 Personal history of nicotine dependence: Secondary | ICD-10-CM

## 2018-11-25 DIAGNOSIS — E785 Hyperlipidemia, unspecified: Secondary | ICD-10-CM | POA: Diagnosis present

## 2018-11-25 DIAGNOSIS — Z7984 Long term (current) use of oral hypoglycemic drugs: Secondary | ICD-10-CM

## 2018-11-25 DIAGNOSIS — E1140 Type 2 diabetes mellitus with diabetic neuropathy, unspecified: Secondary | ICD-10-CM | POA: Diagnosis present

## 2018-11-25 DIAGNOSIS — I4510 Unspecified right bundle-branch block: Secondary | ICD-10-CM | POA: Diagnosis present

## 2018-11-25 LAB — CBC
HCT: 32 % — ABNORMAL LOW (ref 39.0–52.0)
Hemoglobin: 10.6 g/dL — ABNORMAL LOW (ref 13.0–17.0)
MCH: 33.1 pg (ref 26.0–34.0)
MCHC: 33.1 g/dL (ref 30.0–36.0)
MCV: 100 fL (ref 80.0–100.0)
Platelets: 195 10*3/uL (ref 150–400)
RBC: 3.2 MIL/uL — ABNORMAL LOW (ref 4.22–5.81)
RDW: 11.9 % (ref 11.5–15.5)
WBC: 9.6 10*3/uL (ref 4.0–10.5)
nRBC: 0 % (ref 0.0–0.2)

## 2018-11-25 LAB — COMPREHENSIVE METABOLIC PANEL
ALT: 15 U/L (ref 0–44)
AST: 17 U/L (ref 15–41)
Albumin: 3.9 g/dL (ref 3.5–5.0)
Alkaline Phosphatase: 69 U/L (ref 38–126)
Anion gap: 9 (ref 5–15)
BUN: 36 mg/dL — ABNORMAL HIGH (ref 8–23)
CO2: 25 mmol/L (ref 22–32)
Calcium: 9.1 mg/dL (ref 8.9–10.3)
Chloride: 103 mmol/L (ref 98–111)
Creatinine, Ser: 1.27 mg/dL — ABNORMAL HIGH (ref 0.61–1.24)
GFR calc Af Amer: >60 mL/min (ref >60)
GFR calc non Af Amer: 57 mL/min — ABNORMAL LOW (ref >60)
Glucose, Bld: 217 mg/dL — ABNORMAL HIGH (ref 70–99)
Potassium: 4.4 mmol/L (ref 3.5–5.1)
Sodium: 137 mmol/L (ref 135–145)
Total Bilirubin: 0.7 mg/dL (ref 0.3–1.2)
Total Protein: 6.7 g/dL (ref 6.5–8.1)

## 2018-11-25 LAB — GLUCOSE, CAPILLARY
Glucose-Capillary: 115 mg/dL — ABNORMAL HIGH (ref 70–99)
Glucose-Capillary: 115 mg/dL — ABNORMAL HIGH (ref 70–99)

## 2018-11-25 LAB — SURGICAL PCR SCREEN
MRSA, PCR: NEGATIVE
Staphylococcus aureus: NEGATIVE

## 2018-11-25 LAB — PREPARE RBC (CROSSMATCH)

## 2018-11-25 MED ORDER — METRONIDAZOLE 500 MG PO TABS
500.0000 mg | ORAL_TABLET | Freq: Four times a day (QID) | ORAL | Status: AC
  Administered 2018-11-25 – 2018-11-26 (×3): 500 mg via ORAL
  Filled 2018-11-25 (×3): qty 1

## 2018-11-25 MED ORDER — LIDOCAINE-PRILOCAINE 2.5-2.5 % EX CREA
TOPICAL_CREAM | CUTANEOUS | Status: DC | PRN
  Administered 2018-11-25: 13:00:00 via TOPICAL
  Filled 2018-11-25: qty 5

## 2018-11-25 MED ORDER — PIPERACILLIN-TAZOBACTAM 3.375 G IVPB 30 MIN
3.3750 g | INTRAVENOUS | Status: AC
  Administered 2018-11-26: 3.375 g via INTRAVENOUS
  Filled 2018-11-25 (×2): qty 50

## 2018-11-25 MED ORDER — INSULIN ASPART 100 UNIT/ML ~~LOC~~ SOLN
0.0000 [IU] | Freq: Three times a day (TID) | SUBCUTANEOUS | Status: DC
  Administered 2018-11-26: 5 [IU] via SUBCUTANEOUS

## 2018-11-25 MED ORDER — NEOMYCIN SULFATE 500 MG PO TABS
500.0000 mg | ORAL_TABLET | Freq: Four times a day (QID) | ORAL | Status: AC
  Administered 2018-11-25 – 2018-11-26 (×3): 500 mg via ORAL
  Filled 2018-11-25 (×4): qty 1

## 2018-11-25 MED ORDER — INSULIN ASPART 100 UNIT/ML ~~LOC~~ SOLN
3.0000 [IU] | Freq: Three times a day (TID) | SUBCUTANEOUS | Status: DC
  Administered 2018-11-29 – 2018-12-02 (×6): 3 [IU] via SUBCUTANEOUS

## 2018-11-25 MED ORDER — SODIUM CHLORIDE 0.9 % IV SOLN
INTRAVENOUS | Status: DC
  Administered 2018-11-25: 18:00:00 via INTRAVENOUS

## 2018-11-25 MED ORDER — SODIUM CHLORIDE 0.9% FLUSH
10.0000 mL | INTRAVENOUS | Status: DC | PRN
  Administered 2018-12-02: 10 mL
  Filled 2018-11-25: qty 40

## 2018-11-25 MED ORDER — SODIUM CHLORIDE 0.9% FLUSH
10.0000 mL | Freq: Two times a day (BID) | INTRAVENOUS | Status: DC
  Administered 2018-11-27 – 2018-12-01 (×4): 10 mL

## 2018-11-25 MED ORDER — PEG 3350-KCL-NA BICARB-NACL 420 G PO SOLR
4000.0000 mL | Freq: Once | ORAL | Status: AC
  Administered 2018-11-25: 4000 mL via ORAL

## 2018-11-25 MED ORDER — CHLORHEXIDINE GLUCONATE CLOTH 2 % EX PADS
6.0000 | MEDICATED_PAD | Freq: Every day | CUTANEOUS | Status: DC
  Administered 2018-11-26: 6 via TOPICAL

## 2018-11-25 NOTE — Consult Note (Signed)
[place] Nurse requested for preoperative stoma site marking  Discussed surgical procedure and stoma creation with patient.  Explained role of the [place] nurse team.  Provided the patient with educational booklet and provided samples of pouching options.  Answered patient questions.   Examined patient lying, sitting, and standing in order to place the marking in the patient's visual field, away from any creases or abdominal contour issues and within the rectus muscle.  Attempted to mark above the patient's belt line.   Marked for ileal conduit in the RUQ __7__  cm to the right of the umbilicus and  _7.1___ cm above the umbilicus.   Patient's abdomen cleansed with CHG wipes at site markings, allowed to air dry prior to marking.Covered mark with thin film transparent dressing to preserve mark until date of surgery.   Val Riles, RN, MSN, CWOCN, CNS-BC, pager [phone]

## 2018-11-25 NOTE — Plan of Care (Signed)
  Problem: Education: Goal: Knowledge of General Education information will improve Description: Including pain rating scale, medication(s)/side effects and non-pharmacologic comfort measures Outcome: Progressing   Problem: Clinical Measurements: Goal: Will remain free from infection Outcome: Progressing   Problem: Clinical Measurements: Goal: Diagnostic test results will improve Outcome: Progressing   Problem: Clinical Measurements: Goal: Respiratory complications will improve Outcome: Progressing   Problem: Clinical Measurements: Goal: Cardiovascular complication will be avoided Outcome: Progressing   Problem: Coping: Goal: Level of anxiety will decrease Outcome: Progressing

## 2018-11-25 NOTE — H&P (Signed)
Sergio Pennington is an 68 y.o. male.    Chief Complaint: Pre-Op Cystoprostatectomy.  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 Initial staging with some borderling pelivc adenopathy (<2cm each node). 5 cycles gem-cis under care of Dr. Alen Pennington. Restaging CT chest/abd/pelvis 10/2018 with resolved adenopathy and improved but persistant bladder tumor.   2 -  Stage 3 Renal Insuficiency - Cr 1.5's at baseline. He is diabetic. CT x several w/o hydro.   PMH sig for DM2 (mild neuropath), no ischemic CV disease / blood thinners. NO chest/abd surgeries. 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 His PCP is Sergio Blinks MD.   Today " Sergio Pennington " is seen as pre-op admission for bowel prep, labs, stomal marking prior to cystoprostatectomy tomorrow. No interval fevers.    Past Medical History:  Diagnosis Date  . Bladder cancer ([place])   . Cellulitis    Left leg   . Constipation   . Diabetes mellitus without complication ([place])   . Diabetic neuropathy ([place])    Feet  . Hematuria 04/21/2018  . Hyperlipidemia   . 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 Vasculitis ([place])    Left leg    Past Surgical History:  Procedure Laterality Date  . IR IMAGING GUIDED PORT INSERTION  06/26/2018  . MOUTH SURGERY  1980  . TONSILLECTOMY     4th grade  . TRANSURETHRAL RESECTION OF BLADDER TUMOR N/A 06/16/2018   Procedure: TRANSURETHRAL RESECTION OF BLADDER TUMOR (TURBT);  Surgeon: Sergio Mallow, MD;  Location: [facility];  Service: Urology;  Laterality: N/A;    No family history on file. Social History:  reports that he has quit smoking. He has never used smokeless tobacco. He reports previous alcohol use of about 1.0 standard drinks of alcohol per week. He reports previous drug use.  Allergies:  Allergies  Allergen Reactions  . Sulfa Antibiotics Hives     Medications Prior to Admission  Medication Sig Dispense Refill  . acetaminophen (TYLENOL) 500 MG tablet Take 1,000 mg by mouth 2 (two) times a day.    . Cholecalciferol (VITAMIN D3) 125 MCG (5000 UT) CAPS Take 5,000 Units by mouth daily.    . Cyanocobalamin (VITAMIN B-12 PO) Take 4,000 mcg by mouth daily.     . Digestive Enzymes (DIGESTIVE ENZYME PO) Take 1 capsule by mouth 2 (two) times daily.     . Emollient (CERAVE) CREA Apply 1 application topically 2 (two) times daily.    Marland Kitchen lidocaine-prilocaine (EMLA) cream Apply 1 application topically as needed. 30 g 0  . LORazepam (ATIVAN) 0.5 MG tablet Place 1 tablet (0.5 mg total) under the tongue every 8 (eight) hours as needed (nausea). 30 tablet 0  . metFORMIN (GLUCOPHAGE) 500 MG tablet Take 500 mg by mouth daily with breakfast.     . OVER THE COUNTER MEDICATION Take 240 mLs by mouth daily. Asca Liquid Supplement    . OVER THE COUNTER MEDICATION 1,000 mg 2 (two) times daily. Graviola    . OVER THE COUNTER MEDICATION CTFO super fruit chew    . OVER THE COUNTER MEDICATION Alive Men's 50+ gummy    . OVER THE COUNTER MEDICATION Elderberry syrup with turmeric    . polyethylene glycol powder (MIRALAX) 17 GM/SCOOP powder Take 1 Container by mouth daily.     . Probiotic Product (PROBIOTIC DAILY PO)  Take 1 capsule by mouth daily.    . prochlorperazine (COMPAZINE) 10 MG tablet TAKE 1 TABLET BY MOUTH EVERY 6 HOURS AS NEEDED FOR NAUSEA OR VOMITING. (Patient taking differently: Take 10 mg by mouth every 6 (six) hours as needed for nausea or vomiting. ) 30 tablet 0  . senna-docusate (SENNA S) 8.6-50 MG tablet Take 1 tablet by mouth 2 (two) times daily. 90 tablet 3  . tamsulosin (FLOMAX) 0.4 MG CAPS capsule Take 0.4 mg by mouth daily.    Marland Kitchen tolnaftate (TINACTIN) 1 % spray Apply 1 spray topically daily as needed (for feet itching).    . triamcinolone cream (KENALOG) 0.1 % APPLY TO AFFECTED AREA TWICE A DAY (Patient taking differently: Apply 1 application  topically 2 (two) times daily as needed (for itching). ) 80 g 1  . UNABLE TO FIND 1,000 mg 3 (three) times daily. Med Name:[facility]    . Zinc 30 MG TABS Take 1 tablet by mouth daily.     . magnesium oxide (MAG-OX) 400 MG tablet TAKE 1 TABLET BY MOUTH EVERY DAY 30 tablet 0    Results for orders placed or performed during the hospital encounter of 11/25/18 (from the past 48 hour(s))  Surgical pcr screen     Status: None   Collection Time: 11/25/18 10:31 AM   Specimen: Nasal Mucosa; Nasal Swab  Result Value Ref Range   MRSA, PCR NEGATIVE NEGATIVE   Staphylococcus aureus NEGATIVE NEGATIVE    Comment: (NOTE) The Xpert SA Assay (FDA approved for NASAL specimens in patients 94 years of age and older), is one component of a comprehensive surveillance program. It is not intended to diagnose infection nor to guide or monitor treatment. Performed at [facility], [place] [address], [place], [place] [address]   CBC     Status: Abnormal   Collection Time: 11/25/18  1:54 PM  Result Value Ref Range   WBC 9.6 4.0 - 10.5 K/uL   RBC 3.20 (L) 4.22 - 5.81 MIL/uL   Hemoglobin 10.6 (L) 13.0 - 17.0 g/dL   HCT 32.0 (L) 39.0 - 52.0 %   MCV 100.0 80.0 - 100.0 fL   MCH 33.1 26.0 - 34.0 pg   MCHC 33.1 30.0 - 36.0 g/dL   RDW 11.9 11.5 - 15.5 %   Platelets 195 150 - 400 K/uL   nRBC 0.0 0.0 - 0.2 %    Comment: Performed at [facility], [place] [address] [place], [place] [address]  Comprehensive metabolic panel     Status: Abnormal   Collection Time: 11/25/18  1:54 PM  Result Value Ref Range   Sodium 137 135 - 145 mmol/L   Potassium 4.4 3.5 - 5.1 mmol/L   Chloride 103 98 - 111 mmol/L   CO2 25 22 - 32 mmol/L   Glucose, Bld 217 (H) 70 - 99 mg/dL   BUN 36 (H) 8 - 23 mg/dL   Creatinine, Ser 1.27 (H) 0.61 - 1.24 mg/dL   Calcium 9.1 8.9 - 10.3 mg/dL   Total Protein 6.7 6.5 - 8.1 g/dL   Albumin 3.9 3.5 - 5.0 g/dL   AST 17 15 - 41 U/L   ALT 15 0 - 44 U/L   Alkaline Phosphatase  69 38 - 126 U/L   Total Bilirubin 0.7 0.3 - 1.2 mg/dL   GFR calc non Af Amer 57 (L) >60 mL/min   GFR calc Af Amer >60 >60 mL/min   Anion gap 9 5 - 15  Comment: Performed at [facility], [place] [address] [place], [place] [address]  Type and screen [place]     Status: None (Preliminary result)   Collection Time: 11/25/18  1:54 PM  Result Value Ref Range   ABO/RH(D) O POS    Antibody Screen      NEG Performed at [facility], [place] [address] [place], [place] [address]    Sample Expiration PENDING    No results found.  Review of Systems  Constitutional: Negative for chills and fever.    Blood pressure (!) 174/80, pulse 67, temperature 98 F (36.7 C), temperature source Oral, resp. rate 18, SpO2 100 %. Physical Exam  HENT:  Head: Normocephalic.  Eyes: Pupils are equal, round, and reactive to light.  Neck: Normal range of motion.  Cardiovascular: Normal rate.  Respiratory: Effort normal.  GI:  Stable truncal obesity. Stomal marking site noted.   Genitourinary:    Genitourinary Comments: No CVAT at present.    Neurological: He is alert.  Skin: Skin is warm.     Assessment/Plan  Proceed as planned with cysto/ICG and robotic cystoprostatectomy with conduit diversion. Bowel Prep, stomal marking, entereg, T+C.   Alexis Frock, MD 11/25/2018, 4:35 PM

## 2018-11-25 NOTE — Progress Notes (Signed)
Dr. Tresa Moore notified of patient's arrival to room 1401.  Dr. Tresa Moore gave order to access patient's port-a-cath and may use Lidocaine for access.

## 2018-11-26 ENCOUNTER — Observation Stay (HOSPITAL_COMMUNITY): Payer: Medicare Other | Admitting: Registered Nurse

## 2018-11-26 ENCOUNTER — Encounter (HOSPITAL_COMMUNITY): Payer: Self-pay

## 2018-11-26 ENCOUNTER — Encounter (HOSPITAL_COMMUNITY)
Admission: AD | Disposition: A | Discharge status: Home Health | Payer: Self-pay | Source: Ambulatory Visit | Attending: Urology

## 2018-11-26 DIAGNOSIS — Z87891 Personal history of nicotine dependence: Secondary | ICD-10-CM | POA: Diagnosis not present

## 2018-11-26 DIAGNOSIS — D62 Acute posthemorrhagic anemia: Secondary | ICD-10-CM | POA: Diagnosis not present

## 2018-11-26 DIAGNOSIS — Z882 Allergy status to sulfonamides status: Secondary | ICD-10-CM | POA: Diagnosis not present

## 2018-11-26 DIAGNOSIS — C679 Malignant neoplasm of bladder, unspecified: Secondary | ICD-10-CM | POA: Diagnosis present

## 2018-11-26 DIAGNOSIS — C7989 Secondary malignant neoplasm of other specified sites: Secondary | ICD-10-CM | POA: Diagnosis present

## 2018-11-26 DIAGNOSIS — N179 Acute kidney failure, unspecified: Secondary | ICD-10-CM | POA: Diagnosis not present

## 2018-11-26 DIAGNOSIS — E1140 Type 2 diabetes mellitus with diabetic neuropathy, unspecified: Secondary | ICD-10-CM | POA: Diagnosis present

## 2018-11-26 DIAGNOSIS — I4510 Unspecified right bundle-branch block: Secondary | ICD-10-CM | POA: Diagnosis present

## 2018-11-26 DIAGNOSIS — Z7984 Long term (current) use of oral hypoglycemic drugs: Secondary | ICD-10-CM | POA: Diagnosis not present

## 2018-11-26 DIAGNOSIS — E785 Hyperlipidemia, unspecified: Secondary | ICD-10-CM | POA: Diagnosis present

## 2018-11-26 HISTORY — PX: CYSTOSCOPY WITH INJECTION: SHX1424

## 2018-11-26 LAB — GLUCOSE, CAPILLARY
Glucose-Capillary: 133 mg/dL — ABNORMAL HIGH (ref 70–99)
Glucose-Capillary: 191 mg/dL — ABNORMAL HIGH (ref 70–99)
Glucose-Capillary: 241 mg/dL — ABNORMAL HIGH (ref 70–99)
Glucose-Capillary: 233 mg/dL — ABNORMAL HIGH (ref 70–99)
Glucose-Capillary: 159 mg/dL — ABNORMAL HIGH (ref 70–99)

## 2018-11-26 LAB — HEMOGLOBIN AND HEMATOCRIT, BLOOD
HCT: 29.3 % — ABNORMAL LOW (ref 39.0–52.0)
Hemoglobin: 10 g/dL — ABNORMAL LOW (ref 13.0–17.0)

## 2018-11-26 SURGERY — ROBOT ASSISTED LAPAROSCOPIC RADICAL CYSTOPROSTATECTOMY BILATERAL PELVIC LYMPHADENECTOMY,ORTHOTOPIC NEOBLADDER
Anesthesia: General

## 2018-11-26 MED ORDER — LACTATED RINGERS IR SOLN
Status: DC | PRN
  Administered 2018-11-26: 1000 mL

## 2018-11-26 MED ORDER — ROCURONIUM BROMIDE 10 MG/ML (PF) SYRINGE
PREFILLED_SYRINGE | INTRAVENOUS | Status: AC
  Filled 2018-11-26: qty 10

## 2018-11-26 MED ORDER — EPHEDRINE 5 MG/ML INJ
INTRAVENOUS | Status: AC
  Filled 2018-11-26: qty 10

## 2018-11-26 MED ORDER — MIDAZOLAM HCL 5 MG/5ML IJ SOLN
INTRAMUSCULAR | Status: DC | PRN
  Administered 2018-11-26: 2 mg via INTRAVENOUS

## 2018-11-26 MED ORDER — GLYCOPYRROLATE 0.2 MG/ML IJ SOLN
INTRAMUSCULAR | Status: DC | PRN
  Administered 2018-11-26: 0.2 mg via INTRAVENOUS

## 2018-11-26 MED ORDER — PROPOFOL 10 MG/ML IV BOLUS
INTRAVENOUS | Status: DC | PRN
  Administered 2018-11-26: 120 mg via INTRAVENOUS

## 2018-11-26 MED ORDER — ACETAMINOPHEN 10 MG/ML IV SOLN
1000.0000 mg | Freq: Four times a day (QID) | INTRAVENOUS | Status: AC
  Administered 2018-11-26 – 2018-11-27 (×4): 1000 mg via INTRAVENOUS
  Filled 2018-11-26 (×4): qty 100

## 2018-11-26 MED ORDER — MIDAZOLAM HCL 2 MG/2ML IJ SOLN
INTRAMUSCULAR | Status: AC
  Filled 2018-11-26: qty 2

## 2018-11-26 MED ORDER — LIDOCAINE 2% (20 MG/ML) 5 ML SYRINGE
INTRAMUSCULAR | Status: DC | PRN
  Administered 2018-11-26: 80 mg via INTRAVENOUS

## 2018-11-26 MED ORDER — ONDANSETRON HCL 4 MG/2ML IJ SOLN
INTRAMUSCULAR | Status: DC | PRN
  Administered 2018-11-26: 4 mg via INTRAVENOUS

## 2018-11-26 MED ORDER — SUCCINYLCHOLINE CHLORIDE 200 MG/10ML IV SOSY
PREFILLED_SYRINGE | INTRAVENOUS | Status: AC
  Filled 2018-11-26: qty 10

## 2018-11-26 MED ORDER — INSULIN ASPART 100 UNIT/ML ~~LOC~~ SOLN
SUBCUTANEOUS | Status: AC
  Filled 2018-11-26: qty 1

## 2018-11-26 MED ORDER — OXYCODONE HCL 5 MG/5ML PO SOLN: 5.0000 mg | Freq: Once | ORAL | Status: DC | PRN

## 2018-11-26 MED ORDER — LIDOCAINE 2% (20 MG/ML) 5 ML SYRINGE
INTRAMUSCULAR | Status: AC
  Filled 2018-11-26: qty 5

## 2018-11-26 MED ORDER — HYDROMORPHONE HCL 1 MG/ML IJ SOLN
INTRAMUSCULAR | Status: AC
  Filled 2018-11-26: qty 2

## 2018-11-26 MED ORDER — ALVIMOPAN 12 MG PO CAPS
12.0000 mg | ORAL_CAPSULE | Freq: Two times a day (BID) | ORAL | Status: DC
  Administered 2018-11-27 – 2018-11-29 (×5): 12 mg via ORAL
  Filled 2018-11-26 (×5): qty 1

## 2018-11-26 MED ORDER — ROCURONIUM BROMIDE 10 MG/ML (PF) SYRINGE
PREFILLED_SYRINGE | INTRAVENOUS | Status: DC | PRN
  Administered 2018-11-26: 60 mg via INTRAVENOUS
  Administered 2018-11-26 (×6): 20 mg via INTRAVENOUS

## 2018-11-26 MED ORDER — SUGAMMADEX SODIUM 200 MG/2ML IV SOLN
INTRAVENOUS | Status: AC
  Filled 2018-11-26: qty 2

## 2018-11-26 MED ORDER — LACTATED RINGERS IV SOLN
INTRAVENOUS | Status: DC
  Administered 2018-11-26 (×2): via INTRAVENOUS

## 2018-11-26 MED ORDER — FENTANYL CITRATE (PF) 250 MCG/5ML IJ SOLN
INTRAMUSCULAR | Status: DC | PRN
  Administered 2018-11-26: 100 ug via INTRAVENOUS
  Administered 2018-11-26: 50 ug via INTRAVENOUS

## 2018-11-26 MED ORDER — FENTANYL CITRATE (PF) 250 MCG/5ML IJ SOLN
INTRAMUSCULAR | Status: AC
  Filled 2018-11-26: qty 5

## 2018-11-26 MED ORDER — PROMETHAZINE HCL 25 MG/ML IJ SOLN: 6.2500 mg | INTRAMUSCULAR | Status: DC | PRN

## 2018-11-26 MED ORDER — SODIUM CHLORIDE (PF) 0.9 % IJ SOLN
INTRAMUSCULAR | Status: AC
  Filled 2018-11-26: qty 20

## 2018-11-26 MED ORDER — EPHEDRINE SULFATE-NACL 50-0.9 MG/10ML-% IV SOSY
PREFILLED_SYRINGE | INTRAVENOUS | Status: DC | PRN
  Administered 2018-11-26 (×2): 10 mg via INTRAVENOUS
  Administered 2018-11-26: 15 mg via INTRAVENOUS

## 2018-11-26 MED ORDER — SUGAMMADEX SODIUM 200 MG/2ML IV SOLN
INTRAVENOUS | Status: AC
  Filled 2018-11-26: qty 4

## 2018-11-26 MED ORDER — STERILE WATER FOR IRRIGATION IR SOLN
Status: DC | PRN
  Administered 2018-11-26: 1000 mL

## 2018-11-26 MED ORDER — SUGAMMADEX SODIUM 200 MG/2ML IV SOLN
INTRAVENOUS | Status: DC | PRN
  Administered 2018-11-26: 200 mg via INTRAVENOUS

## 2018-11-26 MED ORDER — DEXAMETHASONE SODIUM PHOSPHATE 10 MG/ML IJ SOLN
INTRAMUSCULAR | Status: DC | PRN
  Administered 2018-11-26: 10 mg via INTRAVENOUS

## 2018-11-26 MED ORDER — SUCCINYLCHOLINE CHLORIDE 200 MG/10ML IV SOSY
PREFILLED_SYRINGE | INTRAVENOUS | Status: DC | PRN
  Administered 2018-11-26: 120 mg via INTRAVENOUS

## 2018-11-26 MED ORDER — BUPIVACAINE LIPOSOME 1.3 % IJ SUSP
20.0000 mL | Freq: Once | INTRAMUSCULAR | Status: AC
  Administered 2018-11-26: 20 mL
  Filled 2018-11-26: qty 20

## 2018-11-26 MED ORDER — PROPOFOL 10 MG/ML IV BOLUS
INTRAVENOUS | Status: AC
  Filled 2018-11-26: qty 20

## 2018-11-26 MED ORDER — CHLORHEXIDINE GLUCONATE CLOTH 2 % EX PADS
6.0000 | MEDICATED_PAD | Freq: Every day | CUTANEOUS | Status: DC
  Administered 2018-11-26 – 2018-11-29 (×3): 6 via TOPICAL

## 2018-11-26 MED ORDER — HYDROMORPHONE HCL 1 MG/ML IJ SOLN
0.2500 mg | INTRAMUSCULAR | Status: DC | PRN
  Administered 2018-11-26 (×2): 0.5 mg via INTRAVENOUS

## 2018-11-26 MED ORDER — ONDANSETRON HCL 4 MG/2ML IJ SOLN
INTRAMUSCULAR | Status: AC
  Filled 2018-11-26: qty 2

## 2018-11-26 MED ORDER — ACETAMINOPHEN 10 MG/ML IV SOLN
INTRAVENOUS | Status: AC
  Filled 2018-11-26: qty 100

## 2018-11-26 MED ORDER — ALVIMOPAN 12 MG PO CAPS
12.0000 mg | ORAL_CAPSULE | ORAL | Status: AC
  Administered 2018-11-26: 12 mg via ORAL
  Filled 2018-11-26: qty 1

## 2018-11-26 MED ORDER — INSULIN ASPART 100 UNIT/ML ~~LOC~~ SOLN
0.0000 [IU] | Freq: Three times a day (TID) | SUBCUTANEOUS | Status: DC
  Administered 2018-11-26: 5 [IU] via SUBCUTANEOUS
  Administered 2018-11-27 – 2018-11-28 (×5): 2 [IU] via SUBCUTANEOUS
  Administered 2018-11-30 – 2018-12-01 (×2): 3 [IU] via SUBCUTANEOUS
  Administered 2018-12-01 – 2018-12-02 (×3): 2 [IU] via SUBCUTANEOUS

## 2018-11-26 MED ORDER — DEXAMETHASONE SODIUM PHOSPHATE 10 MG/ML IJ SOLN
INTRAMUSCULAR | Status: AC
  Filled 2018-11-26: qty 1

## 2018-11-26 MED ORDER — LIDOCAINE HCL 2 % IJ SOLN
INTRAMUSCULAR | Status: AC
  Filled 2018-11-26: qty 20

## 2018-11-26 MED ORDER — HYDROMORPHONE HCL 1 MG/ML IJ SOLN: 0.5000 mg | INTRAMUSCULAR | Status: DC | PRN

## 2018-11-26 MED ORDER — GLYCOPYRROLATE PF 0.2 MG/ML IJ SOSY
PREFILLED_SYRINGE | INTRAMUSCULAR | Status: AC
  Filled 2018-11-26: qty 1

## 2018-11-26 MED ORDER — OXYCODONE HCL 5 MG PO TABS: 5.0000 mg | ORAL_TABLET | Freq: Once | ORAL | Status: DC | PRN

## 2018-11-26 MED ORDER — OXYCODONE HCL 5 MG PO TABS
5.0000 mg | ORAL_TABLET | ORAL | Status: DC | PRN
  Administered 2018-11-27 – 2018-11-28 (×2): 5 mg via ORAL
  Filled 2018-11-26 (×2): qty 1

## 2018-11-26 MED ORDER — WATER FOR IRRIGATION, STERILE IR SOLN
Status: DC | PRN
  Administered 2018-11-26: 1000 mL

## 2018-11-26 MED ORDER — DIPHENHYDRAMINE HCL 12.5 MG/5ML PO ELIX: 12.5000 mg | ORAL_SOLUTION | Freq: Four times a day (QID) | ORAL | Status: DC | PRN

## 2018-11-26 MED ORDER — LIDOCAINE 20MG/ML (2%) 15 ML SYRINGE OPTIME
INTRAMUSCULAR | Status: DC | PRN
  Administered 2018-11-26: 1.5 mg/kg/h via INTRAVENOUS

## 2018-11-26 MED ORDER — SODIUM CHLORIDE 0.45 % IV SOLN
INTRAVENOUS | Status: DC
  Administered 2018-11-26 – 2018-11-30 (×9): via INTRAVENOUS

## 2018-11-26 MED ORDER — DIPHENHYDRAMINE HCL 50 MG/ML IJ SOLN: 12.5000 mg | Freq: Four times a day (QID) | INTRAMUSCULAR | Status: DC | PRN

## 2018-11-26 MED ORDER — ONDANSETRON HCL 4 MG/2ML IJ SOLN: 4.0000 mg | INTRAMUSCULAR | Status: DC | PRN

## 2018-11-26 SURGICAL SUPPLY — 87 items
APPLICATOR COTTON TIP 6 STRL (MISCELLANEOUS) ×2 IMPLANT
APPLICATOR COTTON TIP 6IN STRL (MISCELLANEOUS) ×4
APPLICATOR SURGIFLO ENDO (HEMOSTASIS) IMPLANT
BAG LAPAROSCOPIC 12 15 PORT 16 (BASKET) ×2 IMPLANT
BAG RETRIEVAL 12/15 (BASKET) ×3
BAG RETRIEVAL 12/15MM (BASKET) ×1
BLADE HEX COATED 2.75 (ELECTRODE) ×4 IMPLANT
BLADE SURG SZ10 CARB STEEL (BLADE) IMPLANT
CATH SILICONE 5CC 18FR (INSTRUMENTS) ×4 IMPLANT
CELLS DAT CNTRL 66122 CELL SVR (MISCELLANEOUS) ×2 IMPLANT
CHLORAPREP W/TINT 26 (MISCELLANEOUS) ×4 IMPLANT
CLIP VESOLOCK LG 6/CT PURPLE (CLIP) ×8 IMPLANT
CLIP VESOLOCK MED LG 6/CT (CLIP) ×4 IMPLANT
CLIP VESOLOCK XL 6/CT (CLIP) ×4 IMPLANT
CLOTH BEACON ORANGE TIMEOUT ST (SAFETY) ×4 IMPLANT
COVER SURGICAL LIGHT HANDLE (MISCELLANEOUS) ×4 IMPLANT
COVER TIP SHEARS 8 DVNC (MISCELLANEOUS) ×2 IMPLANT
COVER TIP SHEARS 8MM DA VINCI (MISCELLANEOUS) ×2
COVER WAND RF STERILE (DRAPES) IMPLANT
DECANTER SPIKE VIAL GLASS SM (MISCELLANEOUS) ×4 IMPLANT
DERMABOND ADVANCED (GAUZE/BANDAGES/DRESSINGS) ×2
DERMABOND ADVANCED .7 DNX12 (GAUZE/BANDAGES/DRESSINGS) ×2 IMPLANT
DRAIN CHANNEL RND F F (WOUND CARE) IMPLANT
DRAPE ARM DVNC X/XI (DISPOSABLE) ×8 IMPLANT
DRAPE COLUMN DVNC XI (DISPOSABLE) ×2 IMPLANT
DRAPE DA VINCI XI ARM (DISPOSABLE) ×8
DRAPE DA VINCI XI COLUMN (DISPOSABLE) ×2
ELECT PENCIL ROCKER SW 15FT (MISCELLANEOUS) ×4 IMPLANT
ELECT REM PT RETURN 15FT ADLT (MISCELLANEOUS) ×4 IMPLANT
EVACUATOR SILICONE 100CC (DRAIN) IMPLANT
GAUZE 4X4 16PLY RFD (DISPOSABLE) IMPLANT
GLOVE BIO SURGEON STRL SZ 6.5 (GLOVE) ×6 IMPLANT
GLOVE BIO SURGEONS STRL SZ 6.5 (GLOVE) ×2
GLOVE BIOGEL M STRL SZ7.5 (GLOVE) ×12 IMPLANT
GOWN STRL REUS W/TWL LRG LVL3 (GOWN DISPOSABLE) ×24 IMPLANT
GOWN STRL REUS W/TWL XL LVL3 (GOWN DISPOSABLE) ×4 IMPLANT
IRRIG SUCT STRYKERFLOW 2 WTIP (MISCELLANEOUS) ×4
IRRIGATION SUCT STRKRFLW 2 WTP (MISCELLANEOUS) ×2 IMPLANT
KIT PROCEDURE DA VINCI SI (MISCELLANEOUS) ×2
KIT PROCEDURE DVNC SI (MISCELLANEOUS) ×2 IMPLANT
KIT TURNOVER KIT A (KITS) ×4 IMPLANT
LOOP VESSEL MAXI BLUE (MISCELLANEOUS) ×4 IMPLANT
MANIFOLD NEPTUNE II (INSTRUMENTS) ×4 IMPLANT
NEEDLE INSUFFLATION 14GA 120MM (NEEDLE) ×4 IMPLANT
PACK ROBOT UROLOGY CUSTOM (CUSTOM PROCEDURE TRAY) ×4 IMPLANT
PAD POSITIONING PINK XL (MISCELLANEOUS) ×4 IMPLANT
PORT ACCESS TROCAR AIRSEAL 12 (TROCAR) ×2 IMPLANT
PORT ACCESS TROCAR AIRSEAL 5M (TROCAR) ×2
RELOAD STAPLER GREEN 60MM (STAPLE) ×10 IMPLANT
RELOAD STAPLER WHITE 60MM (STAPLE) ×16 IMPLANT
RTRCTR WOUND ALEXIS 18CM MED (MISCELLANEOUS) ×4
SEAL CANN UNIV 5-8 DVNC XI (MISCELLANEOUS) ×8 IMPLANT
SEAL XI 5MM-8MM UNIVERSAL (MISCELLANEOUS) ×8
SET TRI-LUMEN FLTR TB AIRSEAL (TUBING) ×4 IMPLANT
SET TUBE SMOKE EVAC HIGH FLOW (TUBING) ×4 IMPLANT
SOLUTION ELECTROLUBE (MISCELLANEOUS) ×4 IMPLANT
SPONGE LAP 18X18 RF (DISPOSABLE) ×8 IMPLANT
SPONGE LAP 4X18 RFD (DISPOSABLE) ×4 IMPLANT
STAPLER ECHELON LONG 60 440 (INSTRUMENTS) ×4 IMPLANT
STAPLER RELOAD GREEN 60MM (STAPLE) ×20
STAPLER RELOAD WHITE 60MM (STAPLE) ×32
STENT SET URETHERAL LEFT 7FR (STENTS) ×4 IMPLANT
STENT SET URETHERAL RIGHT 7FR (STENTS) ×4 IMPLANT
SURGIFLO W/THROMBIN 8M KIT (HEMOSTASIS) IMPLANT
SUT CHROMIC 4 0 RB 1X27 (SUTURE) ×4 IMPLANT
SUT ETHILON 3 0 PS 1 (SUTURE) ×4 IMPLANT
SUT MNCRL AB 4-0 PS2 18 (SUTURE) ×12 IMPLANT
SUT PDS AB 0 CTX 36 PDP370T (SUTURE) ×12 IMPLANT
SUT PROLENE 4 0 RB 1 (SUTURE) ×2
SUT PROLENE 4-0 RB1 .5 CRCL 36 (SUTURE) ×2 IMPLANT
SUT SILK 3 0 SH 30 (SUTURE) IMPLANT
SUT SILK 3 0 SH CR/8 (SUTURE) ×4 IMPLANT
SUT VIC AB 2-0 UR5 27 (SUTURE) ×16 IMPLANT
SUT VIC AB 3-0 SH 27 (SUTURE) ×4
SUT VIC AB 3-0 SH 27X BRD (SUTURE) ×2 IMPLANT
SUT VIC AB 3-0 SH 27XBRD (SUTURE) ×2 IMPLANT
SUT VIC AB 4-0 RB1 27 (SUTURE) ×8
SUT VIC AB 4-0 RB1 27XBRD (SUTURE) ×8 IMPLANT
SUT VLOC BARB 180 ABS3/0GR12 (SUTURE) ×4
SUTURE VLOC BRB 180 ABS3/0GR12 (SUTURE) ×2 IMPLANT
SYR CONTROL 10ML LL (SYRINGE) IMPLANT
SYSTEM UROSTOMY GENTLE TOUCH (WOUND CARE) ×4 IMPLANT
TOWEL OR NON WOVEN STRL DISP B (DISPOSABLE) ×4 IMPLANT
TROCAR BLADELESS 15MM (ENDOMECHANICALS) ×4 IMPLANT
WATER STERILE IRR 1000ML POUR (IV SOLUTION) ×8 IMPLANT
WATER STERILE IRR 3000ML UROMA (IV SOLUTION) ×4 IMPLANT
YANKAUER SUCT BULB TIP 10FT TU (MISCELLANEOUS) IMPLANT

## 2018-11-26 NOTE — Anesthesia Procedure Notes (Signed)
Procedure Name: Intubation Date/Time: 11/26/2018 8:36 AM Performed by: Talbot Grumbling, CRNA Pre-anesthesia Checklist: Patient identified, Emergency Drugs available, Suction available and Patient being monitored Patient Re-evaluated:Patient Re-evaluated prior to induction Oxygen Delivery Method: Circle system utilized Preoxygenation: Pre-oxygenation with 100% oxygen Induction Type: IV induction Ventilation: Mask ventilation without difficulty Laryngoscope Size: [name] and 3 Grade View: Grade I Tube type: Oral Tube size: 7.5 mm Number of attempts: 1 Airway Equipment and Method: Stylet Placement Confirmation: ETT inserted through vocal cords under direct vision,  positive ETCO2 and breath sounds checked- equal and bilateral Secured at: 23 cm Tube secured with: Tape Dental Injury: Teeth and Oropharynx as per pre-operative assessment

## 2018-11-26 NOTE — Anesthesia Preprocedure Evaluation (Signed)
Anesthesia Evaluation  Patient identified by MRN, date of birth, ID band Patient awake    Reviewed: Allergy & Precautions, NPO status , Patient's Chart, lab work & pertinent test results  History of Anesthesia Complications Negative for: history of anesthetic complications  Airway Mallampati: I  TM Distance: >3 FB Neck ROM: Full    Dental  (+) Dental Advisory Given, Edentulous Upper, Edentulous Lower   Pulmonary former smoker,    Pulmonary exam normal breath sounds clear to auscultation       Cardiovascular Normal cardiovascular exam Rhythm:Regular Rate:Normal  EKG 06/13/18: incomplete RBBB   Neuro/Psych negative neurological ROS     GI/Hepatic negative GI ROS, Neg liver ROS,   Endo/Other  diabetes, Type 2, Oral Hypoglycemic Agents  Renal/GU negative Renal ROS     Musculoskeletal negative musculoskeletal ROS (+)   Abdominal   Peds  Hematology negative hematology ROS (+)   Anesthesia Other Findings Day of surgery medications reviewed with the patient.  Reproductive/Obstetrics                             Anesthesia Physical  Anesthesia Plan  ASA: III  Anesthesia Plan: General   Post-op Pain Management:    Induction: Intravenous  PONV Risk Score and Plan: 2 and Treatment may vary due to age or medical condition, Ondansetron and Midazolam  Airway Management Planned: Oral ETT  Additional Equipment:   Intra-op Plan:   Post-operative Plan: Extubation in OR  Informed Consent: I have reviewed the patients History and Physical, chart, labs and discussed the procedure including the risks, benefits and alternatives for the proposed anesthesia with the patient or authorized representative who has indicated his/her understanding and acceptance.     Dental advisory given  Plan Discussed with: CRNA  Anesthesia Plan Comments:         Anesthesia Quick Evaluation

## 2018-11-26 NOTE — Brief Op Note (Signed)
11/25/2018 - 11/26/2018  2:55 PM  PATIENT:  Sergio Pennington  68 y.o. male  PRE-OPERATIVE DIAGNOSIS:  BLADDER CANCER  POST-OPERATIVE DIAGNOSIS:  BLADDER CANCER  PROCEDURE:  Procedure(s) with comments: ROBOT ASSISTED LAPAROSCOPIC RADICAL CYSTOPROSTATECTOMY BILATERAL PELVIC LYMPHADENECTOMY,AND ILEAL CONDUIT DIVERSION (Bilateral) - 6 HRS CYSTOSCOPY WITH INJECTION OF INDOCYANINE GREEN DYE (N/A)  SURGEON:  Surgeon(s) and Role:    Alexis Frock, MD - Primary  PHYSICIAN ASSISTANT:   ASSISTANTS: Clemetine Marker PA   ANESTHESIA:   local and general  EBL:  250 mL    DRAINS: 1 - JP to bulb; 2 - RLQ Urostomy wtih Rt (red) and LT (blue) bander stents to gravity   LOCAL MEDICATIONS USED:  MARCAINE     SPECIMEN:  Source of Specimen:  1 - ureteral margins, 2 - pelvic lymph nodes; 3 - cystoprostatecotmy  DISPOSITION OF SPECIMEN:  PATHOLOGY  COUNTS:  YES  TOURNIQUET:  * No tourniquets in log *  DICTATION: .Other Dictation: Dictation Number  [id number]  PLAN OF CARE: Admit to inpatient   PATIENT DISPOSITION:  PACU - hemodynamically stable.   Delay start of Pharmacological VTE agent (>24hrs) due to surgical blood loss or risk of bleeding: yes

## 2018-11-26 NOTE — Progress Notes (Signed)
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 Initial staging with some borderling pelivc adenopathy (<2cm each node). 5 cycles gem-cis under care of Dr. Alen Blew. Restaging CT chest/abd/pelvis 10/2018 with resolved adenopathy and improved but persistant bladder tumor. Admitted 6/30 for bowel prep, stomal marking prior to cystectomy today.   2 -  Stage 3 Renal Insuficiency - Cr 1.5's at baseline. He is diabetic. CT x several w/o hydro.   Today " Sergio Pennington " is ready for major extirpative surgery for his bladder cancer. He completed bowel prep to clear. Hgb 10.6, Cr 1.27.     Objective: Vital signs in last 24 hours: Temp:  [98 F (36.7 C)-98.5 F (36.9 C)] 98.5 F (36.9 C) (07/01 0600) Pulse Rate:  [66-69] 66 (07/01 0600) Resp:  [16-20] 16 (07/01 0600) BP: (152-174)/(68-80) 164/68 (07/01 0600) SpO2:  [95 %-100 %] 95 % (07/01 0600) Weight:  [77 kg] 77 kg (06/30 2122) Last BM Date: 11/25/18  Intake/Output from previous day: 06/30 0701 - 07/01 0700 In: 889.9 [P.O.:290; I.V.:599.9] Out: 950 [Urine:750; Emesis/NG output:200] Intake/Output this shift: Total I/O In: 889.9 [P.O.:290; I.V.:599.9] Out: 950 [Urine:750; Emesis/NG output:200]  Physical Exam  HENT:  Head: Normocephalic.  Eyes: Pupils are equal, round, and reactive to light.  Neck: Normal range of motion.  Cardiovascular: Normal rate.  Respiratory: Effort normal.  GI: Mild truncal obesity. Stomal marking site noted.   Genitourinary:    Genitourinary Comments: No CVAT at present.   Neurological: He is alert.  Skin: Skin is warm.   Lab Results:  Recent Labs    11/25/18 1354  WBC 9.6  HGB 10.6*  HCT 32.0*  PLT 195   BMET Recent Labs    11/25/18 1354  NA 137  K 4.4  CL 103  CO2 25  GLUCOSE 217*  BUN 36*  CREATININE 1.27*  CALCIUM 9.1   PT/INR No results for input(s): LABPROT, INR in the last 72  hours. ABG No results for input(s): PHART, HCO3 in the last 72 hours.  Invalid input(s): PCO2, PO2  Studies/Results: No results found.  Anti-infectives: Anti-infectives (From admission, onward)   Start     Dose/Rate Route Frequency Ordered Stop   11/25/18 1800  neomycin (MYCIFRADIN) tablet 500 mg     500 mg Oral Every 6 hours 11/25/18 1645 11/26/18 0632   11/25/18 1800  metroNIDAZOLE (FLAGYL) tablet 500 mg     500 mg Oral Every 6 hours 11/25/18 1645 11/26/18 0632   11/25/18 1022  piperacillin-tazobactam (ZOSYN) IVPB 3.375 g     3.375 g 100 mL/hr over 30 Minutes Intravenous 30 min pre-op 11/25/18 1022        Assessment/Plan: Proceed as planned with cystoprostactecomy, node dissection, cysto / ICG injection, conduit diversion. Risks, benefits, expected peri-op course discussed previously and reiterated today.   Alexis Frock 11/26/2018

## 2018-11-26 NOTE — Discharge Instructions (Signed)

## 2018-11-26 NOTE — Transfer of Care (Signed)
Immediate Anesthesia Transfer of Care Note  Patient: Mike Hamre [place]en-[place]  Procedure(s) Performed: ROBOT ASSISTED LAPAROSCOPIC RADICAL CYSTOPROSTATECTOMY BILATERAL PELVIC LYMPHADENECTOMY,AND ILEAL CONDUIT DIVERSION (Bilateral ) CYSTOSCOPY WITH INJECTION OF INDOCYANINE GREEN DYE (N/A )  Patient Location: PACU  Anesthesia Type:General  Level of Consciousness: sedated  Airway & Oxygen Therapy: Patient Spontanous Breathing and Patient connected to face mask oxygen  Post-op Assessment: Report given to RN and Post -op Vital signs reviewed and stable  Post vital signs: Reviewed and stable  Last Vitals:  Vitals Value Taken Time  BP    Temp    Pulse    Resp    SpO2      Last Pain:  Vitals:   11/26/18 0730  TempSrc:   PainSc: 0-No pain      Patients Stated Pain Goal: 4 (54/30/14 8403)  Complications: No apparent anesthesia complications

## 2018-11-27 ENCOUNTER — Encounter (HOSPITAL_COMMUNITY): Payer: Self-pay | Admitting: Urology

## 2018-11-27 LAB — BASIC METABOLIC PANEL
Anion gap: 8 (ref 5–15)
BUN: 34 mg/dL — ABNORMAL HIGH (ref 8–23)
CO2: 24 mmol/L (ref 22–32)
Calcium: 8.6 mg/dL — ABNORMAL LOW (ref 8.9–10.3)
Chloride: 102 mmol/L (ref 98–111)
Creatinine, Ser: 1.59 mg/dL — ABNORMAL HIGH (ref 0.61–1.24)
GFR calc Af Amer: 50 mL/min — ABNORMAL LOW (ref >60)
GFR calc non Af Amer: 43 mL/min — ABNORMAL LOW (ref >60)
Glucose, Bld: 139 mg/dL — ABNORMAL HIGH (ref 70–99)
Potassium: 4.4 mmol/L (ref 3.5–5.1)
Sodium: 134 mmol/L — ABNORMAL LOW (ref 135–145)

## 2018-11-27 LAB — GLUCOSE, CAPILLARY
Glucose-Capillary: 122 mg/dL — ABNORMAL HIGH (ref 70–99)
Glucose-Capillary: 131 mg/dL — ABNORMAL HIGH (ref 70–99)
Glucose-Capillary: 143 mg/dL — ABNORMAL HIGH (ref 70–99)
Glucose-Capillary: 155 mg/dL — ABNORMAL HIGH (ref 70–99)

## 2018-11-27 LAB — HEMOGLOBIN AND HEMATOCRIT, BLOOD
HCT: 25.6 % — ABNORMAL LOW (ref 39.0–52.0)
Hemoglobin: 8.8 g/dL — ABNORMAL LOW (ref 13.0–17.0)

## 2018-11-27 MED ORDER — HEPARIN SODIUM (PORCINE) 5000 UNIT/ML IJ SOLN
5000.0000 [IU] | Freq: Three times a day (TID) | INTRAMUSCULAR | Status: DC
  Administered 2018-11-27 – 2018-12-02 (×16): 5000 [IU] via SUBCUTANEOUS
  Filled 2018-11-27 (×16): qty 1

## 2018-11-27 NOTE — Op Note (Signed)
NAME: Sergio Pennington, Sergio Pennington MEDICAL RECORD PH:[id number] ACCOUNT [id number] DATE OF BIRTH:April 04, 1951 FACILITY: [facility] LOCATION: [facility] PHYSICIAN:Delray Reza, MD  OPERATIVE REPORT  DATE OF PROCEDURE:  11/26/2018  PREOPERATIVE DIAGNOSIS:  Muscle invasive bladder cancer.  PROCEDURE: 1.  Cystoscopy with ICG injection. 2.  Robotic-assisted laparoscopic radical cystectomy with prostatectomy. 3.  Bilateral pelvic laminectomy. 4.  Ileal conduit urinary diversion.  ESTIMATED BLOOD LOSS:  250 mL.  COMPLICATIONS:  None.  SPECIMEN: 1.  Right distal ureteral margin frozen section negative. 2.  Left distal ureteral margin frozen section negative. 3.  Final right ureteral margin. 4.  Final left ureteral margin. 5.  Cystoprostatectomy. 6.  Right external iliac lymph nodes. 7.  Right obturator lymph nodes. 8.  Right common iliac lymph nodes. 9.  Left common iliac lymph nodes. 10.  Left external iliac lymph nodes. 11.  Left obturator lymph nodes, sentinel noted as such.  DRAINS: 1.  Jackson-Pratt drain to bulb suction. 2.  Right lower quadrant urostomy to gravity drainage with right (red), left (blue) bander stents to gravity drainage.  ASSISTANT:  Debbrah Alar, PA-C  FINDINGS: 1.  Significant residual volume of nodular tumor in the urinary bladder, mostly left-sided. 2.  Bilateral pelvic lymphadenopathy, very adherent to the iliac vessels. 3.  Very nodular appearance of bilateral bladder pedicles, concerning for possible T3-T4 disease.  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  He  underwent neoadjuvant chemotherapy, which resulted in a very impressive radiographic response for evolution of his adenopathy.  I was referred for further management including maximal local control  with cystoprostatectomy with goal of maximal control  versus cure, and he adamantly wished to proceed.  He was admitted yesterday for bowel prep, stomal marking, and preoperative labs which were all acceptable.  He presents for major extirpative surgery today.  Informed consent was then placed in the  medical record.  PROCEDURE IN DETAIL:  The patient being identified and procedure being cystoprostatectomy was confirmed.  Procedure timeout was performed.  Intravenous antibiotics were administered.  General endotracheal anesthesia was induced.  The patient was placed  into a low lithotomy position.  A sterile field was created by prepping and draping the base of the penis, perineum and proximal thighs using iodine and his infraxiphoid abdomen using chlorhexidine gluconate.  After clipper shaving, then bolstering him  onto the operative table using 3-inch tape with foam padding.  A test in steep Trendelenburg positioning was performed, and he was found to be suitably positioned.  Cystourethroscopy was then performed using a 24-French rigid injection scope set.   Inspection of anterior and posterior urethra was unremarkable.  Inspection of urinary bladder revealed a significant amount of residual nodular-appearing tumor, mostly on the left lateral wall, base and trigone areas.  Next, 2 mL of an indocyanine green  dye was injected directly into the tumor area across 4 submucosal blebs per ICG angiography, and a new silicone Foley catheter was placed free to straight drain.  Next, a high-flow, low-pressure pneumoperitoneum was obtained using Veress technique in the  supraumbilical midline, having passed the aspiration and drop test.  An 8 mm robotic camera port was then placed in the same location.  Laparoscopic examination of the peritoneal cavity revealed no significant adhesions, no visceral injury.  Distal  ports were placed as follows:  Rt paramedian 8 mm  robotic port, right far lateral 12 mm assistant  port site, right paramedian 15 mm assistant port site at the site of the previously marked urostomy, left paramedian 8 mm robotic port, left far lateral 8 mm robotic  port.  Robot was docked and passed the electronic checks.  Attention was directed at development of the left retroperitoneum.  Incision was made lateral to the descending colon from the area of the iliac vessels superiorly as far as possible towards the  area of the diaphragm and inferiorly lateral to the left medial umbilical ligament.  This is usually a peritoneal flap retracted medially.  The left ureter was encountered as it coursed over the iliac vessels, marked as a vessel loop, dissected  proximally for a distance of approximately 8 cm above the iliac crossing and distally to the ureterovesical junction, which I doubly clipped and ligated the proximal end, being a dyed tag suture.  Frozen section negative for carcinoma and left ureters  tucked out of the true pelvis.  Lymphadenectomy was performed on the left side.  First, the left external group with the boundaries being left external iliac artery, vein, pelvic sidewall, iliac bifurcation.  Lymphostasis was achieved with cold clips,  set aside, labeled left external iliac lymph nodes.  Notably, there was significant adherence of the nodes, especially to the external iliac vein and, as such, there was not 100% complete node sampling for safety purposes.  Next, left obturator group was  dissected free with the boundaries being left external iliac vein, pelvic sidewall, obturator nerve.  Lymphostasis was achieved with cold clips.  SI labeled left obturator lymph nodes.  Obturator nerve was inspected following maneuvers and found to be  uninjured.  Next, left common iliac group was dissected free with the boundaries being iliac bifurcation, aortic bifurcation.  Hemostasis was achieved with cold clips.  Attention was then directed at right-sided dissection.  The ileocecal junction was   identified and terminal ileum marked approximately 16 cm proximal to this with a silk tag suture and a clip distal to this to denote proximal distal orientation.  An incision was made lateral to the ascending colon from the area of the iliac vessels  superiorly for a distance of approximately 10 cm and inferiorly just lateral to the right medial umbilical ligament.  This was used as a bucket handle to right medial lateral retraction.  The right ureter was identified as it coursed over the iliac  vessels, marked as a vessel loop, dissected proximally at a distance of approximately 6 cm and distally to the ureterovesical junction, which was doubly clipped and ligated, the proximal being a tagged white suture.  Frozen section negative for  carcinoma.  The endopelvic fascia was swept away from the lateral aspect of the prostate in a base to apex orientation bilaterally.  Attention was directed at posterior reconstruction.  A posterior peritoneal flap was developed by connecting the previous  posterior lateral peritoneal incisions, and a posterior flap was developed towards the apex of the prostate.  This exposed the vascular pedicles of the bladder and prostate which were controlled using sequential white load stapler x3 each side, taking  exquisite care to avoid rectal injury, which did not occur.  The space of Retzius was then developed by dropping the bladder away from the anterior abdominal wall.  This exposed the dorsal venous complex.  It was controlled using a green load stapler.   Final apical dissection was performed in the anterior plane.  The membranous urethra  was coldly transected for 50% of its circumference.  In situ Foley catheter was doubly clipped proximally and purposely transected and posterior circumference ligated.   This completely freed up the cystoprostatectomy specimen.  It was placed into an extra-large EndoCatch bag for later retrieval.  Digital rectal exam was performed using an  indicator glove and laparoscopic vision.  No evidence of rectal violation was  noted.  The membranous urethral stump was oversewn using a running V-Loc to prevent prolonged penile drainage.  At this point, sponge and needle counts were correct.  Hemostasis appeared excellent.  We achieved the goals of the extirpative portion of the  surgery today.  The left ureter was retroperitonealized and brought just anterior to the aortic bifurcation by the descending colon.  The right hemi-abdomen and both ureteral tag sutures and the terminal ileum tag sutures were placed into a single clip  with a laparoscopic needle driver via the right 15 mm port site.  A closed suction drain was brought through the left lateral-most port site into the peritoneal cavity, and the specimen bag string was brought through the left paramedian robotic port  site.  The robot was then undocked.  The specimen was retrieved by extending the previous camera port site inferiorly for a distance of approximately 6 cm, removing the cystoprostatectomy specimen and setting aside for permanent pathology.  A wound  protector was applied via this.  Attention was directed at harvesting the conduit segment.  The previously marked distal ileum was marked.  A segment of ileum approximately 14 cm in length was taken into continuity using a green load stapler proximal and  distal.  The mesentery was developed using 1-1/2 loads with white load stapler distally and 1 load proximally.  The conduit segment was put into retroperitoneal orientation and appeared to be visibly viable.  There were palpable pulses throughout the  arcades.  Bowel-to-bowel anastomosis was performed using a green load stapler x2 with the free end being oversewn with a running Vicryl and a second layer of imbricating running Vicryl.  The acute angle was bolstered using interrupted silk, and the  mesenteric defect was closed using interrupted silk  and Vicryl was used for the oversewing  of the free end of bowel anastomosis.  Bowel anastomosis was visibly viable, palpably patent, redelivered into the abdominal cavity.  The proximal  end of staple line and conduit excluded.  Distal staple line removed, and the tagged sutures were removed.  Attention was directed at ureteroenteric anastomosis.  First on the left ureter a segment of proximal conduit was excised approximately 4 mm close  to the mesenteric edge, and 4 imbricating sutures of 4-0 Vicryl were applied.  The left ureter was spatulated, the final margins sent, and a heel stitch of 4-0 Vicryl was applied.  Then a blue-colored bander stent 25 cm anastomosis was placed.  The  ureteroenteric anastomosis was completed using 2 separate running suture lines of 4-0 Vicryl interrupted in excellent tension-free apposition.  The blue bander stent was anchored to the level of the mid conduit using a single through-and-through chromic  stitch fluoroscopy.  Similarly, the right ureteroenteric anastomosis was performed as per the left, but on the contralateral side of the proximal conduit using a red-colored bander stent 26 cm to anastomosis.  This was also anchored in place.  The ostomy  site was developed by excising a core approximately 1/4 size diameter of the skin and subcutaneous tissue to the level of fascia.  The fascia dilated to  accommodate 2 surgeon's fingers and four fascial anchoring  Vicryl sutures were placed in 4 quadrants,  respectively.  The distal conduit was brought through this and anchored to prevent parastomal hernia formation.  Omentum was brought over the extraction site.  The abdomen was again inspected.  Hemostasis was excellent.  No obvious bowel injury.  The  extraction site was closed with fascia using figure-of-eight PDS x6 followed by reapproximation of Scarpa's with a running Vicryl.  All incision sites were infiltrated with dilute lipolyzed Marcaine and closed with skin using subcuticular Monocryl and  Dermabond.  A  drain stitch was applied.  Final maturation of the stoma was performed using 2-0 Vicryl, 4 quadrants in a rosebudding fashion, followed by intervening interrupted 3-0 Vicryl.  The ostomy appliance was placed.  The procedure was terminated.   The patient tolerated the procedure well.  No immediate complications.  The patient was taken to postanesthesia care in stable condition.  Please note first assistant Debbrah Alar was crucial for all portions of the surgery today.  She provided invaluable retraction, specimen manipulation, vascular clipping, vascular stapling, and general first assistance.  LN/NUANCE  D:11/26/2018 T:11/26/2018 JOB:[id number]

## 2018-11-27 NOTE — Consult Note (Signed)
[facility] Nurse ostomy follow up Patient receiving care in [place]. Stoma type/location: RUQ urostomy. Stoma dark pink/red/ moist/ budded Stomal assessment/size: deferred Peristomal assessment: deferred Treatment options for stomal/peristomal skin: barrier ring Output: stents producing light blood tinged urine, connected to a bedside drainage bag  Ostomy pouching: 2pc. flat Education provided: How pouch is connected to bedside drainage bag.  Teaching plan for tomorrow with his wife present. Enrolled patient in [place] Discharge program: No  I spoke with his wife, [name], over the phone at [phone].  She is looking forward to a teaching session tomorrow morning at 9 am.  Orders for supplies have been entered.  Pouch will be changed tomorrow. Val Riles, RN, MSN, CWOCN, CNS-BC, pager [phone]

## 2018-11-27 NOTE — Plan of Care (Signed)
Patient arrived to unit via recliner from ICU. Stood and transitioned from chair to bed once in room. Steady on feet, but needs assist. Complains of slight pain in abdomen at this time but does not want pain medication currently. Will continue to monitor.

## 2018-11-27 NOTE — Progress Notes (Signed)
Urology Post-op note  68 year old male status post robotic cystoprostatectomy with ileal conduit on 7/1.  Adherent tissue over the left iliac vein but otherwise uncomplicated procedure  Subjective: Patient was up intermittently last night due to monitor alarm.  Otherwise doing well.  No flatus or bowel movement.  Creatinine 1.5 from baseline 1.2.  Hemoglobin 8.8 from postop of 10.0.  Adequate urine output  Objective: Vital signs in last 24 hours: Temp:  [97.7 F (36.5 C)-98.2 F (36.8 C)] 98.1 F (36.7 C) (07/02 0720) Pulse Rate:  [79-88] 88 (07/02 1000) Resp:  [4-23] 18 (07/02 1000) BP: (124-156)/(55-79) 133/59 (07/02 1000) SpO2:  [92 %-100 %] 93 % (07/02 1000) Weight:  [75.3 kg] 75.3 kg (07/01 1600)  Intake/Output from previous day: 07/01 0701 - 07/02 0700 In: 3239.1 [I.V.:2995.2; IV Piggyback:243.9] Out: 1530 [Urine:1000; Drains:280; Blood:250] Intake/Output this shift: Total I/O In: 468.3 [I.V.:264.1; IV Piggyback:204.2] Out: -   Physical Exam:  General: Alert and oriented. Abdomen: Soft, appropriately tender, incisions clean dry intact with Dermabond.  Left lower quadrant JP was serosanguineous output GU: Right lower quadrant urostomy pink and healthy.  Bilateral ureteral stents visualized.  Urine light red in tubing   Lab Results: Recent Labs    11/25/18 1354 11/26/18 1522 11/27/18 0430  HGB 10.6* 10.0* 8.8*  HCT 32.0* 29.3* 25.6*    Assessment/Plan: POD#1  - sips of ice chips today, likely advance to clears on 7/3 - Okay to transfer to floor status given hemodynamic stability - Heparin prophylaxis every 8 - Continue maintenance IV fluids - Urostomy care  [name], MD   LOS: 1 day   [name] 11/27/2018, 11:47 AM

## 2018-11-27 NOTE — Anesthesia Postprocedure Evaluation (Signed)
Anesthesia Post Note  Patient: Sergio Pennington  Procedure(s) Performed: ROBOT ASSISTED LAPAROSCOPIC RADICAL CYSTOPROSTATECTOMY BILATERAL PELVIC LYMPHADENECTOMY,AND ILEAL CONDUIT DIVERSION (Bilateral ) CYSTOSCOPY WITH INJECTION OF INDOCYANINE GREEN DYE (N/A )     Patient location during evaluation: PACU Anesthesia Type: General Level of consciousness: awake and alert Pain management: pain level controlled Vital Signs Assessment: post-procedure vital signs reviewed and stable Respiratory status: spontaneous breathing, nonlabored ventilation, respiratory function stable and patient connected to nasal cannula oxygen Cardiovascular status: blood pressure returned to baseline and stable Postop Assessment: no apparent nausea or vomiting Anesthetic complications: no    Last Vitals:  Vitals:   11/27/18 0700 11/27/18 0720  BP: (!) 141/58   Pulse: 88   Resp: 15   Temp:  36.7 C  SpO2: 97%     Last Pain:  Vitals:   11/27/18 0720  TempSrc: Oral  PainSc:                  Vitali Seibert L [place] Yeats

## 2018-11-28 LAB — BASIC METABOLIC PANEL
Anion gap: 12 (ref 5–15)
BUN: 28 mg/dL — ABNORMAL HIGH (ref 8–23)
CO2: 22 mmol/L (ref 22–32)
Calcium: 8.6 mg/dL — ABNORMAL LOW (ref 8.9–10.3)
Chloride: 103 mmol/L (ref 98–111)
Creatinine, Ser: 1.29 mg/dL — ABNORMAL HIGH (ref 0.61–1.24)
GFR calc Af Amer: >60 mL/min (ref >60)
GFR calc non Af Amer: 56 mL/min — ABNORMAL LOW (ref >60)
Glucose, Bld: 135 mg/dL — ABNORMAL HIGH (ref 70–99)
Potassium: 4.5 mmol/L (ref 3.5–5.1)
Sodium: 137 mmol/L (ref 135–145)

## 2018-11-28 LAB — GLUCOSE, CAPILLARY
Glucose-Capillary: 144 mg/dL — ABNORMAL HIGH (ref 70–99)
Glucose-Capillary: 136 mg/dL — ABNORMAL HIGH (ref 70–99)
Glucose-Capillary: 109 mg/dL — ABNORMAL HIGH (ref 70–99)

## 2018-11-28 LAB — CREATININE, FLUID (PLEURAL, PERITONEAL, JP DRAINAGE): Creat, Fluid: 1.3 mg/dL

## 2018-11-28 LAB — HEMOGLOBIN AND HEMATOCRIT, BLOOD
HCT: 26.2 % — ABNORMAL LOW (ref 39.0–52.0)
Hemoglobin: 8.5 g/dL — ABNORMAL LOW (ref 13.0–17.0)

## 2018-11-28 NOTE — Progress Notes (Signed)
2 Days Post-Op  Subjective: He is doing well POD 2 from a Robotic cystectomy and ileal conduit.  He has some incisional pain.   He has no nausea but minimal appetite.  He has had minimal flatus.  Urine output is good and JP output is declining.    ROS:  Review of Systems  Constitutional: Negative for chills and fever.  Respiratory: Negative for shortness of breath.   Cardiovascular: Negative for chest pain and leg swelling.  Gastrointestinal: Negative for nausea and vomiting.  All other systems reviewed and are negative.   Anti-infectives: Anti-infectives (From admission, onward)   Start     Dose/Rate Route Frequency Ordered Stop   11/25/18 1800  neomycin (MYCIFRADIN) tablet 500 mg     500 mg Oral Every 6 hours 11/25/18 1645 11/26/18 0632   11/25/18 1800  metroNIDAZOLE (FLAGYL) tablet 500 mg     500 mg Oral Every 6 hours 11/25/18 1645 11/26/18 0632   11/25/18 1022  piperacillin-tazobactam (ZOSYN) IVPB 3.375 g     3.375 g 100 mL/hr over 30 Minutes Intravenous 30 min pre-op 11/25/18 1022 11/26/18 0900      Current Facility-Administered Medications  Medication Dose Route Frequency Provider Last Rate Last Dose  . 0.45 % sodium chloride infusion   Intravenous Continuous Debbrah Alar, PA-C 100 mL/hr at 11/27/18 1958    . alvimopan (ENTEREG) capsule 12 mg  12 mg Oral BID Debbrah Alar, PA-C   12 mg at 11/28/18 9450  . Chlorhexidine Gluconate Cloth 2 % PADS 6 each  6 each Topical Daily Alexis Frock, MD   6 each at 11/27/18 1000  . diphenhydrAMINE (BENADRYL) injection 12.5 mg  12.5 mg Intravenous Q6H PRN Dancy, Amanda, PA-C       Or  . diphenhydrAMINE (BENADRYL) 12.5 MG/5ML elixir 12.5 mg  12.5 mg Oral Q6H PRN Dancy, Amanda, PA-C      . heparin injection 5,000 Units  5,000 Units Subcutaneous Q8H [name], MD   5,000 Units at 11/28/18 [phone]  . HYDROmorphone (DILAUDID) injection 0.5-1 mg  0.5-1 mg Intravenous Q2H PRN Dancy, Amanda, PA-C      . insulin aspart (novoLOG) injection 0-15  Units  0-15 Units Subcutaneous TID WC Alexis Frock, MD   5 Units at 11/26/18 1711  . insulin aspart (novoLOG) injection 0-15 Units  0-15 Units Subcutaneous TID WC Debbrah Alar, PA-C   2 Units at 11/28/18 0829  . insulin aspart (novoLOG) injection 3 Units  3 Units Subcutaneous TID WC Alexis Frock, MD      . lidocaine-prilocaine (EMLA) cream   Topical PRN Alexis Frock, MD      . ondansetron [facility]) injection 4 mg  4 mg Intravenous Q4H PRN Debbrah Alar, PA-C      . oxyCODONE (Oxy IR/ROXICODONE) immediate release tablet 5 mg  5 mg Oral Q4H PRN Debbrah Alar, PA-C   5 mg at 11/27/18 1945  . sodium chloride flush (NS) 0.9 % injection 10-40 mL  10-40 mL Intracatheter Q12H Alexis Frock, MD   10 mL at 11/27/18 0937  . sodium chloride flush (NS) 0.9 % injection 10-40 mL  10-40 mL Intracatheter PRN Alexis Frock, MD         Objective: Vital signs in last 24 hours: Temp:  [98.1 F (36.7 C)-100.2 F (37.9 C)] 99.8 F (37.7 C) (07/03 0643) Pulse Rate:  [80-90] 83 (07/03 0643) Resp:  [16-20] 17 (07/03 0643) BP: (115-164)/(38-79) 143/76 (07/03 0643) SpO2:  [95 %-99 %] 97 % (07/03 0643)  Intake/Output  from previous day: 07/02 0701 - 07/03 0700 In: 1961.8 [I.V.:1757.6; IV Piggyback:204.2] Out: 1920 [Urine:1725; Drains:195] Intake/Output this shift: No intake/output data recorded.   Physical Exam Vitals signs reviewed.  Constitutional:      Appearance: Normal appearance.  Neck:     Musculoskeletal: Normal range of motion and neck supple.  Cardiovascular:     Rate and Rhythm: Normal rate and regular rhythm.     Heart sounds: Normal heart sounds.  Pulmonary:     Effort: Pulmonary effort is normal. No respiratory distress.     Breath sounds: Normal breath sounds.  Abdominal:     General: Abdomen is flat.     Palpations: Abdomen is soft.     Tenderness: There is abdominal tenderness (mild).     Comments: BS are quiet.   Stoma is pink and productive with stents in good  position.   Musculoskeletal: Normal range of motion.        General: No swelling or tenderness.  Skin:    General: Skin is warm and dry.  Neurological:     General: No focal deficit present.     Mental Status: He is alert and oriented to person, place, and time.  Psychiatric:        Mood and Affect: Mood normal.        Behavior: Behavior normal.     Lab Results:  Recent Labs    11/25/18 1354  11/27/18 0430 11/28/18 0440  WBC 9.6  --   --   --   HGB 10.6*   < > 8.8* 8.5*  HCT 32.0*   < > 25.6* 26.2*  PLT 195  --   --   --    < > = values in this interval not displayed.   BMET Recent Labs    11/27/18 0430 11/28/18 0440  NA 134* 137  K 4.4 4.5  CL 102 103  CO2 24 22  GLUCOSE 139* 135*  BUN 34* 28*  CREATININE 1.59* 1.29*  CALCIUM 8.6* 8.6*   PT/INR No results for input(s): LABPROT, INR in the last 72 hours. ABG No results for input(s): PHART, HCO3 in the last 72 hours.  Invalid input(s): PCO2, PO2  Studies/Results: No results found.   Assessment and Plan: POD #2 from cystectomy with ileal conduit.   He is doing well but remains weak with minimal appetite.    Continue current care and clear liquid diet. I will check JP Cr.   Acute blood loss anemia is stable.   AKI has improved.       LOS: 2 days    Irine Seal 11/28/2018 [phone]

## 2018-11-28 NOTE — Consult Note (Signed)
[place] Nurse ostomy follow up Patient receiving care in [place].  A 1.5 hour teaching session and pouch change completed with patient and wife, [name]. Stoma type/location: 1 1/4 inch, round, budded, dark pink/red, moist with sutures intact, stents in place and producing urine in RUQ Stomal assessment/size: 1 1/4 inch Peristomal assessment: intact Treatment options for stomal/peristomal skin: barrier ring (not used today) Output: minimally tinged pink urine  Ostomy pouching: 2pc. Flat 2 1/4 inch Education provided: brochure, folder, complete teaching session.  Wife participated in removing old pouching system, measuring and cutting for new system and snapping new pouch in place. Also, opening/closing pouch and attaching to bedside drain bag and leg bag.  Home supplies packaged for [name] to take with her today. Enrolled patient in [place] Start Discharge program: Yes  Val Riles, RN, MSN, [facility], CNS-BC, pager [phone]

## 2018-11-28 NOTE — Care Management Important Message (Signed)
Important Message  Patient Details  Name: Sergio Pennington MRN: [id number] Date of Birth: 09/29/1950   Medicare Important Message Given:  Yes. CMA printed out the IM for the CSW or the Case Manager Nurse to give to the patient.      Mashanda Ishibashi 11/28/2018, 8:20 AM

## 2018-11-28 NOTE — Evaluation (Signed)
Physical Therapy Evaluation Patient Details Name: Sergio Pennington MRN: [id number] DOB: 01-Mar-1951 Today's Date: 11/28/2018   History of Present Illness  Pt s/p Robotic cystectomy and ileal conduit (11/26/18) due to muscle invasive bladder cancer. Pt reports finishing chemo about 6-7 weeks ago.  Clinical Impression  Pt admitted with above diagnosis. Pt currently with functional limitations due to the deficits listed below (see PT Problem List). Pt will benefit from skilled PT to increase their independence and safety with mobility to allow discharge to the venue listed below.  Pt moving slowly, but able to ambulate 30' with RW and MIN A. Anticipate good progress and recommend HHPT at time of d/c.     Follow Up Recommendations Home health PT;Supervision for mobility/OOB    Equipment Recommendations  None recommended by PT    Recommendations for Other Services OT consult     Precautions / Restrictions Precautions Precautions: Fall Precaution Comments: catheter, drain, 2 recent falls when he was getting chemo Restrictions Weight Bearing Restrictions: No      Mobility  Bed Mobility Overal bed mobility: Needs Assistance Bed Mobility: Rolling;Sidelying to Sit Rolling: Min assist Sidelying to sit: Mod assist       General bed mobility comments: Pt instructed in log rolling and body mechanics to decrease pain with bed mobility.  He required MOD A to get trunk upright, but gave forth good effort.  Transfers Overall transfer level: Needs assistance Equipment used: Rolling walker (2 wheeled) Transfers: Sit to/from [facility] Sit to Stand: Min assist;Mod assist Stand pivot transfers: Min assist       General transfer comment: Stood with MOD A x 1 rep and progressed to MIN A for 2 reps with cues for hand placement. Transferred to [facility] to have BM, but had no success.  Ambulation/Gait Ambulation/Gait assistance: Min assist Gait Distance (Feet): 30  Feet Assistive device: Rolling walker (2 wheeled) Gait Pattern/deviations: Decreased step length - right;Decreased step length - left;Trunk flexed Gait velocity: decreased   General Gait Details: Slow cadence gait and slightly guarded.  C/o mild dizziness.  Stairs            Wheelchair Mobility    Modified Rankin (Stroke Patients Only)       Balance Overall balance assessment: Needs assistance Sitting-balance support: Feet supported Sitting balance-Leahy Scale: Fair     Standing balance support: Bilateral upper extremity supported;During functional activity Standing balance-Leahy Scale: Fair                               Pertinent Vitals/Pain Pain Assessment: 0-10 Pain Score: 7  Pain Location: gut Pain Descriptors / Indicators: Sharp;Sore Pain Intervention(s): Limited activity within patient's tolerance;Monitored during session;Premedicated before session    Ada expects to be discharged to:: Private residence Living Arrangements: Spouse/significant other Available Help at Discharge: Family;Available 24 hours/day Type of Home: House Home Access: Stairs to enter Entrance Stairs-Rails: Psychiatric nurse of Steps: 8 Home Layout: One level Home Equipment: Walker - 4 wheels;Cane - single point;Shower seat;Grab bars - toilet;Grab bars - tub/shower      Prior Function Level of Independence: Independent with assistive device(s)         Comments: Has been using cane and rollator during chemo which ended about 6 weeks ago.  He has been weaning off of them.     Hand Dominance        Extremity/Trunk Assessment   Upper Extremity Assessment Upper  Extremity Assessment: Generalized weakness;LUE deficits/detail LUE Deficits / Details: c/o some soreness in L shoulder for the past few days    Lower Extremity Assessment Lower Extremity Assessment: Generalized weakness    Cervical / Trunk Assessment Cervical /  Trunk Assessment: Kyphotic  Communication   Communication: No difficulties  Cognition Arousal/Alertness: Awake/alert Behavior During Therapy: WFL for tasks assessed/performed Overall Cognitive Status: Within Functional Limits for tasks assessed                                        General Comments General comments (skin integrity, edema, etc.): Pt encouraged to perform LE therex while sitting.    Exercises     Assessment/Plan    PT Assessment Patient needs continued PT services  PT Problem List Decreased strength;Decreased activity tolerance;Decreased balance;Decreased mobility       PT Treatment Interventions DME instruction;Gait training;Stair training;Functional mobility training;Balance training;Therapeutic exercise;Therapeutic activities    PT Goals (Current goals can be found in the Care Plan section)  Acute Rehab PT Goals Patient Stated Goal: get stronger PT Goal Formulation: With patient Time For Goal Achievement: 12/12/18 Potential to Achieve Goals: Good    Frequency Min 3X/week   Barriers to discharge        Co-evaluation               AM-PAC PT "6 Clicks" Mobility  Outcome Measure Help needed turning from your back to your side while in a flat bed without using bedrails?: A Lot Help needed moving from lying on your back to sitting on the side of a flat bed without using bedrails?: A Lot Help needed moving to and from a bed to a chair (including a wheelchair)?: A Little Help needed standing up from a chair using your arms (e.g., wheelchair or bedside chair)?: A Little Help needed to walk in hospital room?: A Little Help needed climbing 3-5 steps with a railing? : A Little 6 Click Score: 16    End of Session Equipment Utilized During Treatment: Gait belt Activity Tolerance: Patient tolerated treatment well;Patient limited by pain Patient left: in chair;with call bell/phone within reach Nurse Communication: Mobility status PT Visit  Diagnosis: Muscle weakness (generalized) (M62.81);Difficulty in walking, not elsewhere classified (R26.2)    Time: 7342-8768 PT Time Calculation (min) (ACUTE ONLY): 41 min   Charges:   PT Evaluation $PT Eval Moderate Complexity: 1 Mod PT Treatments $Gait Training: 8-22 mins $Therapeutic Activity: 8-22 mins        Dwayne Begay L. Tamala Julian, [place] Pager [phone] 11/28/2018   Galen Manila 11/28/2018, 12:17 PM

## 2018-11-28 NOTE — Progress Notes (Signed)
PT Cancellation Note  Patient Details Name: Sergio Pennington MRN: [id number] DOB: 07/20/1950   Cancelled Treatment:    Reason Eval/Treat Not Completed: Other (comment). Pt agreeable to PT, but has a 9 AM meeting with wound care nurse and wife.  Will check back later to complete PT eval.   Galen Manila 11/28/2018, 8:56 AM

## 2018-11-29 LAB — BASIC METABOLIC PANEL
Anion gap: 8 (ref 5–15)
BUN: 26 mg/dL — ABNORMAL HIGH (ref 8–23)
CO2: 24 mmol/L (ref 22–32)
Calcium: 8.4 mg/dL — ABNORMAL LOW (ref 8.9–10.3)
Chloride: 104 mmol/L (ref 98–111)
Creatinine, Ser: 1.15 mg/dL (ref 0.61–1.24)
GFR calc Af Amer: >60 mL/min (ref >60)
GFR calc non Af Amer: >60 mL/min (ref >60)
Glucose, Bld: 97 mg/dL (ref 70–99)
Potassium: 4 mmol/L (ref 3.5–5.1)
Sodium: 136 mmol/L (ref 135–145)

## 2018-11-29 LAB — BPAM RBC
Blood Product Expiration Date: 202007252359
Blood Product Expiration Date: 202007252359
Unit Type and Rh: 5100
Unit Type and Rh: 5100

## 2018-11-29 LAB — TYPE AND SCREEN
ABO/RH(D): O POS
Antibody Screen: NEGATIVE
Unit division: 0
Unit division: 0

## 2018-11-29 LAB — GLUCOSE, CAPILLARY
Glucose-Capillary: 111 mg/dL — ABNORMAL HIGH (ref 70–99)
Glucose-Capillary: 106 mg/dL — ABNORMAL HIGH (ref 70–99)
Glucose-Capillary: 61 mg/dL — ABNORMAL LOW (ref 70–99)
Glucose-Capillary: 100 mg/dL — ABNORMAL HIGH (ref 70–99)
Glucose-Capillary: 94 mg/dL (ref 70–99)
Glucose-Capillary: 112 mg/dL — ABNORMAL HIGH (ref 70–99)

## 2018-11-29 LAB — HEMOGLOBIN AND HEMATOCRIT, BLOOD
HCT: 25.4 % — ABNORMAL LOW (ref 39.0–52.0)
Hemoglobin: 8.5 g/dL — ABNORMAL LOW (ref 13.0–17.0)

## 2018-11-29 NOTE — Progress Notes (Signed)
3 Days Post-Op Subjective: Patient reports he is feeling well today.  He has passed flatus and walking the halls.  Objective: Vital signs in last 24 hours: Temp:  [99.1 F (37.3 C)-99.7 F (37.6 C)] 99.1 F (37.3 C) (07/04 5035) Pulse Rate:  [73-85] 73 (07/04 0613) Resp:  [16-19] 16 (07/04 0613) BP: (139-170)/(74-80) 148/76 (07/04 0613) SpO2:  [94 %-96 %] 96 % (07/04 4656)  Intake/Output from previous day: 07/03 0701 - 07/04 0700 In: 2396.2 [I.V.:2396.2] Out: 2680 [Urine:2500; Drains:180] Intake/Output this shift: No intake/output data recorded.  Physical Exam:  No acute distress-patient sitting in chair watching TV Cardiovascular-regular rate and rhythm Lungs-clear to auscultation bilaterally Abdomen-incision is clean dry and intact, good bowel sounds.  Soft and nontender.  Ostomy pink and viable.  Stents in place. Extremities-no Pain or swelling.  Lab Results: Recent Labs    11/27/18 0430 11/28/18 0440 11/29/18 0352  HGB 8.8* 8.5* 8.5*  HCT 25.6* 26.2* 25.4*   BMET Recent Labs    11/28/18 0440 11/29/18 0352  NA 137 136  K 4.5 4.0  CL 103 104  CO2 22 24  GLUCOSE 135* 97  BUN 28* 26*  CREATININE 1.29* 1.15  CALCIUM 8.6* 8.4*   No results for input(s): LABPT, INR in the last 72 hours. No results for input(s): LABURIN in the last 72 hours. Results for orders placed or performed during the hospital encounter of 11/25/18  Surgical pcr screen     Status: None   Collection Time: 11/25/18 10:31 AM   Specimen: Nasal Mucosa; Nasal Swab  Result Value Ref Range Status   MRSA, PCR NEGATIVE NEGATIVE Final   Staphylococcus aureus NEGATIVE NEGATIVE Final    Comment: (NOTE) The Xpert SA Assay (FDA approved for NASAL specimens in patients 18 years of age and older), is one component of a comprehensive surveillance program. It is not intended to diagnose infection nor to guide or monitor treatment. Performed at [facility], [place] [address], [place], [place] [address]     Studies/Results: No results found.  Assessment/Plan: POD#3 - Robotic-assisted laparoscopic radical cystectomy with prostatectomy, Bilateral pelvic laminectomy, Ileal conduit urinary diversion  Doing well.  Continue ambulation.  Advanced to clears.   LOS: 3 days   Festus Aloe 11/29/2018, 1:50 PM

## 2018-11-29 NOTE — Evaluation (Signed)
Occupational Therapy Evaluation Patient Details Name: Sergio Pennington MRN: [id number] DOB: [birth year] Today's Date: 11/29/2018    History of Present Illness Pt s/p Robotic cystectomy and ileal conduit (11/26/18) due to muscle invasive bladder cancer. Pt reports finishing chemo about 6-7 weeks ago.   Clinical Impression   This 68 year old man was admitted for the above. At baseline, he is independent with adls.  Pt currently needs min A for bed mobility and transfers.  He needs mod A for LB adls.  Wife will assist as needed. Will follow in acute setting with supervision level goals.  Pt will likely not need toilet DME, but we will further assess this.    Follow Up Recommendations  Home health OT;Supervision/Assistance - 24 hour    Equipment Recommendations  (likely none; has high commode with grab bar)    Recommendations for Other Services       Precautions / Restrictions Precautions Precautions: Fall Precaution Comments: catheter, drain, 2 recent falls when he was getting chemo Restrictions Weight Bearing Restrictions: No      Mobility Bed Mobility     Rolling: Min assist Sidelying to sit: Min assist       General bed mobility comments: light assistance to ensure log roll; used rails.  Min A for trunk to come to sitting  Transfers   Equipment used: Rolling walker (2 wheeled)   Sit to Stand: Min assist         General transfer comment: min A to stand and steady    Balance                                           ADL either performed or assessed with clinical judgement   ADL Overall ADL's : Needs assistance/impaired Eating/Feeding: Independent   Grooming: Set up   Upper Body Bathing: Set up   Lower Body Bathing: Minimal assistance   Upper Body Dressing : Set up   Lower Body Dressing: Moderate assistance   Toilet Transfer: Minimal assistance;Ambulation;RW(chair)             General ADL Comments: Pt was getting HHOT  prior to admission. Working on Adult nurse.  Pt is normally able to cross legs for adls. Can partially cross LLE now. He will have wife assist, if he needs assistance at home.  Walked a short distance in the hall with min steadying assistance.  Encouaraged small bursts of activity and calling nursing to stand if sitting up in chair.  He reports that he sat for 5 hours without standing/walking     Vision         Perception     Praxis      Pertinent Vitals/Pain Pain Assessment: Faces Faces Pain Scale: Hurts a little bit Pain Location: L shoulder and gut Pain Descriptors / Indicators: Sore Pain Intervention(s): Limited activity within patient's tolerance;Monitored during session;Repositioned     Hand Dominance     Extremity/Trunk Assessment Upper Extremity Assessment Upper Extremity Assessment: Generalized weakness LUE Deficits / Details: c/o some soreness in L shoulder for the past few days           Communication Communication Communication: No difficulties   Cognition Arousal/Alertness: Awake/alert Behavior During Therapy: WFL for tasks assessed/performed Overall Cognitive Status: Within Functional Limits for tasks assessed  General Comments       Exercises     Shoulder Instructions      Home Living Family/patient expects to be discharged to:: Private residence Living Arrangements: Spouse/significant other Available Help at Discharge: Family;Available 24 hours/day               Bathroom Shower/Tub: Occupational psychologist: Handicapped height     Home Equipment: Environmental consultant - 4 wheels;Cane - single point;Shower seat;Grab bars - toilet;Grab bars - tub/shower;Shower seat - built in          Prior Functioning/Environment Level of Independence: Independent with assistive device(s)        Comments: Has been using cane and rollator during chemo which ended about 6 weeks ago.  He has  been weaning off of them.        OT Problem List: Decreased strength;Decreased activity tolerance;Impaired balance (sitting and/or standing);Pain;Decreased knowledge of use of DME or AE      OT Treatment/Interventions: Self-care/ADL training;Therapeutic exercise;DME and/or AE instruction;Energy conservation;Patient/family education;Therapeutic activities    OT Goals(Current goals can be found in the care plan section) Acute Rehab OT Goals Patient Stated Goal: get stronger OT Goal Formulation: With patient Time For Goal Achievement: 12/13/18 Potential to Achieve Goals: Good ADL Goals Pt Will Transfer to Toilet: with supervision;ambulating(high with rail) Pt Will Perform Tub/Shower Transfer: Shower transfer;with supervision;shower seat;ambulating  OT Frequency: Min 2X/week   Barriers to D/C:            Co-evaluation              AM-PAC OT "6 Clicks" Daily Activity     Outcome Measure Help from another person eating meals?: None Help from another person taking care of personal grooming?: A Little Help from another person toileting, which includes using toliet, bedpan, or urinal?: A Little Help from another person bathing (including washing, rinsing, drying)?: A Lot Help from another person to put on and taking off regular upper body clothing?: A Little Help from another person to put on and taking off regular lower body clothing?: A Lot 6 Click Score: 17   End of Session Nurse Communication: Mobility status  Activity Tolerance: Patient tolerated treatment well Patient left: in chair;with call bell/phone within reach  OT Visit Diagnosis: Muscle weakness (generalized) (M62.81)                Time: 8921-1941 OT Time Calculation (min): 28 min Charges:  OT General Charges $OT Visit: 1 Visit OT Evaluation $OT Eval Low Complexity: 1 Low OT Treatments $Therapeutic Activity: 8-22 mins  Lesle Chris, OTR/L Acute Rehabilitation Services [phone] [facility] pager [phone]  office 11/29/2018  Joshva Labreck 11/29/2018, 1:23 PM

## 2018-11-29 NOTE — Progress Notes (Signed)
PT Cancellation Note  Patient Details Name: Sergio Pennington MRN: [id number] DOB: [birth year]   Cancelled Treatment:    Reason Eval/Treat Not Completed: Attempted PT tx session-pt declined to participate with PT at this time. He politely requested PT to check back another day.   Weston Anna, PT Acute Rehabilitation Services Pager: [phone] Office: [phone]

## 2018-11-30 LAB — GLUCOSE, CAPILLARY
Glucose-Capillary: 116 mg/dL — ABNORMAL HIGH (ref 70–99)
Glucose-Capillary: 174 mg/dL — ABNORMAL HIGH (ref 70–99)
Glucose-Capillary: 79 mg/dL (ref 70–99)
Glucose-Capillary: 173 mg/dL — ABNORMAL HIGH (ref 70–99)

## 2018-11-30 LAB — HEMOGLOBIN AND HEMATOCRIT, BLOOD
HCT: 25.9 % — ABNORMAL LOW (ref 39.0–52.0)
Hemoglobin: 8.7 g/dL — ABNORMAL LOW (ref 13.0–17.0)

## 2018-11-30 LAB — BASIC METABOLIC PANEL
Anion gap: 9 (ref 5–15)
BUN: 26 mg/dL — ABNORMAL HIGH (ref 8–23)
CO2: 24 mmol/L (ref 22–32)
Calcium: 8.5 mg/dL — ABNORMAL LOW (ref 8.9–10.3)
Chloride: 105 mmol/L (ref 98–111)
Creatinine, Ser: 1.08 mg/dL (ref 0.61–1.24)
GFR calc Af Amer: >60 mL/min (ref >60)
GFR calc non Af Amer: >60 mL/min (ref >60)
Glucose, Bld: 114 mg/dL — ABNORMAL HIGH (ref 70–99)
Potassium: 4 mmol/L (ref 3.5–5.1)
Sodium: 138 mmol/L (ref 135–145)

## 2018-11-30 NOTE — Progress Notes (Signed)
4 Days Post-Op Subjective: Patient reports feeling well. Had a large BM.   Objective: Vital signs in last 24 hours: Temp:  [98.3 F (36.8 C)-99 F (37.2 C)] 98.3 F (36.8 C) (07/05 0641) Pulse Rate:  [69-78] 69 (07/05 0641) Resp:  [18-20] 18 (07/05 0641) BP: (136-147)/(66-82) 139/66 (07/05 0641) SpO2:  [97 %-100 %] 100 % (07/05 0641)  Intake/Output from previous day: 07/04 0701 - 07/05 0700 In: 2692.5 [P.O.:420; I.V.:2272.5] Out: 2935 [Urine:2550; Drains:385] Intake/Output this shift: Total I/O In: 240 [P.O.:240] Out: 360 [Urine:300; Drains:60]  Physical Exam:  No acute distress-patient sitting in chair Cardiovascular-regular rate and rhythm Lungs-clear to auscultation bilaterally Abdomen-incision is clean dry and intact, Soft and nontender.  Ostomy pink and viable.  Stents in place. Extremities-no calf pain or swelling.  Lab Results: Recent Labs    11/28/18 0440 11/29/18 0352 11/30/18 0357  HGB 8.5* 8.5* 8.7*  HCT 26.2* 25.4* 25.9*   BMET Recent Labs    11/29/18 0352 11/30/18 0357  NA 136 138  K 4.0 4.0  CL 104 105  CO2 24 24  GLUCOSE 97 114*  BUN 26* 26*  CREATININE 1.15 1.08  CALCIUM 8.4* 8.5*   No results for input(s): LABPT, INR in the last 72 hours. No results for input(s): LABURIN in the last 72 hours. Results for orders placed or performed during the hospital encounter of 11/25/18  Surgical pcr screen     Status: None   Collection Time: 11/25/18 10:31 AM   Specimen: Nasal Mucosa; Nasal Swab  Result Value Ref Range Status   MRSA, PCR NEGATIVE NEGATIVE Final   Staphylococcus aureus NEGATIVE NEGATIVE Final    Comment: (NOTE) The Xpert SA Assay (FDA approved for NASAL specimens in patients 62 years of age and older), is one component of a comprehensive surveillance program. It is not intended to diagnose infection nor to guide or monitor treatment. Performed at [facility], [place] [address], [place], [place] [address]      Studies/Results: No results found.  Assessment/Plan:  POD#4 - doing well. Advance to full liquid. He will continue ambulation.    LOS: 4 days   Festus Aloe 11/30/2018, 11:25 AM

## 2018-11-30 NOTE — Progress Notes (Signed)
Physical Therapy Treatment Patient Details Name: Sergio Pennington MRN: [id number] DOB: Mar 25, 1951 Today's Date: 11/30/2018    History of Present Illness Pt s/p Robotic cystectomy and ileal conduit (11/26/18) due to muscle invasive bladder cancer. Pt reports finishing chemo about 6-7 weeks ago.    PT Comments    Progressing well with mobility. Pt is motivated to regain independence.    Follow Up Recommendations  Home health PT;Supervision for mobility/OOB     Equipment Recommendations  Rolling walker with 5" wheels    Recommendations for Other Services       Precautions / Restrictions Precautions Precautions: Fall Precaution Comments: ostomy, jp drain Restrictions Weight Bearing Restrictions: No    Mobility  Bed Mobility Overal bed mobility: Needs Assistance Bed Mobility: Rolling;Sidelying to Sit Rolling: Min guard Sidelying to sit: Min guard;HOB elevated       General bed mobility comments: No assist given on today. Pt did rely on bedrails.  Transfers Overall transfer level: Needs assistance Equipment used: Rolling walker (2 wheeled) Transfers: Sit to/from Stand Sit to Stand: Min guard         General transfer comment: close guard for safety.  Ambulation/Gait Ambulation/Gait assistance: Min guard Gait Distance (Feet): 1500 Feet Assistive device: Rolling walker (2 wheeled) Gait Pattern/deviations: Decreased stride length     General Gait Details: fair gait speed. close guard for safety.   Stairs             Wheelchair Mobility    Modified Rankin (Stroke Patients Only)       Balance Overall balance assessment: Needs assistance           Standing balance-Leahy Scale: Fair                              Cognition Arousal/Alertness: Awake/alert Behavior During Therapy: WFL for tasks assessed/performed Overall Cognitive Status: Within Functional Limits for tasks assessed                                        Exercises      General Comments        Pertinent Vitals/Pain Pain Assessment: Faces Faces Pain Scale: Hurts a little bit Pain Location: shoulders, abdomen Pain Descriptors / Indicators: Sore Pain Intervention(s): Monitored during session    Home Living                      Prior Function            PT Goals (current goals can now be found in the care plan section) Progress towards PT goals: Progressing toward goals    Frequency    Min 3X/week      PT Plan Current plan remains appropriate    Co-evaluation              AM-PAC PT "6 Clicks" Mobility   Outcome Measure  Help needed turning from your back to your side while in a flat bed without using bedrails?: A Little Help needed moving from lying on your back to sitting on the side of a flat bed without using bedrails?: A Little Help needed moving to and from a bed to a chair (including a wheelchair)?: A Little Help needed standing up from a chair using your arms (e.g., wheelchair or bedside chair)?: A Little Help needed to walk in hospital  room?: A Little Help needed climbing 3-5 steps with a railing? : A Little 6 Click Score: 18    End of Session   Activity Tolerance: Patient tolerated treatment well Patient left: in chair;with call bell/phone within reach   PT Visit Diagnosis: Muscle weakness (generalized) (M62.81);Difficulty in walking, not elsewhere classified (R26.2);Unsteadiness on feet (R26.81)     Time: 7588-3254 PT Time Calculation (min) (ACUTE ONLY): 23 min  Charges:  $Gait Training: 23-37 mins                       Weston Anna, PT Acute Rehabilitation Services Pager: [phone] Office: [phone]

## 2018-12-01 LAB — BASIC METABOLIC PANEL
Anion gap: 12 (ref 5–15)
BUN: 19 mg/dL (ref 8–23)
CO2: 22 mmol/L (ref 22–32)
Calcium: 8.6 mg/dL — ABNORMAL LOW (ref 8.9–10.3)
Chloride: 106 mmol/L (ref 98–111)
Creatinine, Ser: 1.09 mg/dL (ref 0.61–1.24)
GFR calc Af Amer: >60 mL/min (ref >60)
GFR calc non Af Amer: >60 mL/min (ref >60)
Glucose, Bld: 118 mg/dL — ABNORMAL HIGH (ref 70–99)
Potassium: 3.9 mmol/L (ref 3.5–5.1)
Sodium: 140 mmol/L (ref 135–145)

## 2018-12-01 LAB — HEMOGLOBIN AND HEMATOCRIT, BLOOD
HCT: 25.4 % — ABNORMAL LOW (ref 39.0–52.0)
Hemoglobin: 8.8 g/dL — ABNORMAL LOW (ref 13.0–17.0)

## 2018-12-01 LAB — GLUCOSE, CAPILLARY
Glucose-Capillary: 144 mg/dL — ABNORMAL HIGH (ref 70–99)
Glucose-Capillary: 197 mg/dL — ABNORMAL HIGH (ref 70–99)
Glucose-Capillary: 122 mg/dL — ABNORMAL HIGH (ref 70–99)
Glucose-Capillary: 133 mg/dL — ABNORMAL HIGH (ref 70–99)

## 2018-12-01 NOTE — Progress Notes (Signed)
Teaching for ostomy with patients wife completed on 11/28/2018, information passed to oncoming nurse about further teaching.  Norlene Duel RN, BSN

## 2018-12-01 NOTE — Progress Notes (Signed)
Physical Therapy Treatment Patient Details Name: Sergio Pennington MRN: [id number] DOB: 1950/07/18 Today's Date: 12/01/2018    History of Present Illness Pt s/p Robotic cystectomy and ileal conduit (11/26/18) due to muscle invasive bladder cancer. Pt reports finishing chemo about 6-7 weeks ago.    PT Comments    Progressing with mobility. Pt stated he would still like to get a standard RW for home to use PRN/if necessary. Will continue to follow.    Follow Up Recommendations  Home health PT;Supervision for mobility/OOB     Equipment Recommendations  Rolling walker with 5" wheels    Recommendations for Other Services       Precautions / Restrictions Precautions Precautions: Fall Precaution Comments: ostomy, jp drain Restrictions Weight Bearing Restrictions: No    Mobility  Bed Mobility               General bed mobility comments: oob in recliner  Transfers Overall transfer level: Needs assistance Equipment used: None Transfers: Sit to/from Stand Sit to Stand: Supervision         General transfer comment: for safety.  Ambulation/Gait Ambulation/Gait assistance: Min guard Gait Distance (Feet): 500 Feet Assistive device: IV Pole Gait Pattern/deviations: Step-through pattern     General Gait Details: fair gait speed. close guard for safety.   Stairs Stairs: Yes Stairs assistance: Min guard Stair Management: One rail Right;One rail Left;Step to pattern;Forwards;Alternating pattern Number of Stairs: 9 General stair comments: very close guarding. Recommended to pt that he use step-to pattern for safety.   Wheelchair Mobility    Modified Rankin (Stroke Patients Only)       Balance Overall balance assessment: Mild deficits observed, not formally tested                                          Cognition Arousal/Alertness: Awake/alert Behavior During Therapy: WFL for tasks assessed/performed Overall Cognitive Status:  Within Functional Limits for tasks assessed                                        Exercises      General Comments        Pertinent Vitals/Pain Pain Assessment: Faces Faces Pain Scale: Hurts a little bit Pain Location: shoulders, abdomen Pain Descriptors / Indicators: Sore Pain Intervention(s): Monitored during session    Home Living                      Prior Function            PT Goals (current goals can now be found in the care plan section) Progress towards PT goals: Progressing toward goals    Frequency    Min 3X/week      PT Plan Current plan remains appropriate    Co-evaluation              AM-PAC PT "6 Clicks" Mobility   Outcome Measure  Help needed turning from your back to your side while in a flat bed without using bedrails?: A Little Help needed moving from lying on your back to sitting on the side of a flat bed without using bedrails?: A Little Help needed moving to and from a bed to a chair (including a wheelchair)?: A Little Help needed standing up from a chair  using your arms (e.g., wheelchair or bedside chair)?: A Little Help needed to walk in hospital room?: A Little Help needed climbing 3-5 steps with a railing? : A Little 6 Click Score: 18    End of Session   Activity Tolerance: Patient tolerated treatment well Patient left: in chair;with call bell/phone within reach   PT Visit Diagnosis: Muscle weakness (generalized) (M62.81);Unsteadiness on feet (R26.81)     Time: 6580-0634 PT Time Calculation (min) (ACUTE ONLY): 12 min  Charges:  $Gait Training: 8-22 mins                        Weston Anna, PT Acute Rehabilitation Services Pager: [phone] Office: [phone]

## 2018-12-01 NOTE — Progress Notes (Signed)
5 Days Post-Op Subjective: Patient had a good weekend overall, several bowel movements over the weekend including yesterday.  Tolerating full liquid diet.   Objective: Vital signs in last 24 hours: Temp:  [98.5 F (36.9 C)-98.9 F (37.2 C)] 98.5 F (36.9 C) (07/06 0442) Pulse Rate:  [69-76] 69 (07/06 0442) Resp:  [14] 14 (07/06 0442) BP: (142-152)/(66-79) 142/79 (07/06 0442) SpO2:  [99 %-100 %] 99 % (07/06 0442)  Intake/Output from previous day: 07/05 0701 - 07/06 0700 In: 3187.3 [P.O.:840; I.V.:2347.3] Out: 3065 [Urine:2775; Drains:290] Intake/Output this shift: No intake/output data recorded.  Physical Exam:  No acute distress-patient sitting in bed Abdomen-incision is clean dry and intact, Soft and nontender.  Ostomy pink and viable.  Stents in place.  JP with serous output Extremities-no calf pain or swelling.  Lab Results: Recent Labs    11/29/18 0352 11/30/18 0357 12/01/18 0416  HGB 8.5* 8.7* 8.8*  HCT 25.4* 25.9* 25.4*   BMET Recent Labs    11/30/18 0357 12/01/18 0416  NA 138 140  K 4.0 3.9  CL 105 106  CO2 24 22  GLUCOSE 114* 118*  BUN 26* 19  CREATININE 1.08 1.09  CALCIUM 8.5* 8.6*   No results for input(s): LABPT, INR in the last 72 hours. No results for input(s): LABURIN in the last 72 hours. Results for orders placed or performed during the hospital encounter of 11/25/18  Surgical pcr screen     Status: None   Collection Time: 11/25/18 10:31 AM   Specimen: Nasal Mucosa; Nasal Swab  Result Value Ref Range Status   MRSA, PCR NEGATIVE NEGATIVE Final   Staphylococcus aureus NEGATIVE NEGATIVE Final    Comment: (NOTE) The Xpert SA Assay (FDA approved for NASAL specimens in patients 19 years of age and older), is one component of a comprehensive surveillance program. It is not intended to diagnose infection nor to guide or monitor treatment. Performed at [facility], [place] [address] [place], [place] [address]      Studies/Results: No results found.  Assessment/Plan:  POD#5 - doing well.   --Consider regular diet   --Considered DC fluids   -- ostomy teaching (asked nurse if wife can attend, she will speak with charge)  --Physical therapy, ambulation.  He would like to try walking using only a cane  --Likely stay today   LOS: 5 days   [name] 12/01/2018, 7:04 AM

## 2018-12-01 NOTE — Care Management Important Message (Signed)
Important Message  Patient Details  Name: Sergio Pennington MRN: [id number] Date of Birth: 1951/05/07   Medicare Important Message Given:  Yes. CMA printed out IM for the CSW or Case Manager Nurse to give to the patient.      Kiyra Slaubaugh 12/01/2018, 9:28 AM

## 2018-12-01 NOTE — Consult Note (Signed)
[place] Nurse ostomy follow up Stoma type/location: RUQ urostomy  Pouch intact  Scheduled final teaching session with wife for Tuesday at 31.  Anticipate discharge Tuesday PM or Wednesday, per patient. Home supplies have been sent home.   Stomal assessment/size: 1 1/4" pink patent and producing clear yellow urine.  Ref and blue stents in place Peristomal assessment: pouch intact Treatment options for stomal/peristomal skin: 2 piece flat Will see Tuesday AM with wife.  [place] team to follow.  Domenic Moras MSN, RN, FNP-BC CWON Wound, Ostomy, Continence Nurse Pager [phone]

## 2018-12-01 NOTE — Progress Notes (Addendum)
Occupational Therapy Treatment Patient Details Name: Sergio Pennington MRN: [id number] DOB: 1951/02/13 Today's Date: 12/01/2018    History of present illness Pt s/p Robotic cystectomy and ileal conduit (11/26/18) due to muscle invasive bladder cancer. Pt reports finishing chemo about 6-7 weeks ago.   OT comments  Pt doing well this day.  Pt has elevated toilet seat in home as well as grab bars. Educated on safety with ADL activity  Follow Up Recommendations  Home health OT;Supervision/Assistance - 24 hour    Equipment Recommendations  None recommended by OT(likely none; has high commode with grab bar)    Recommendations for Other Services      Precautions / Restrictions Precautions Precautions: Fall Precaution Comments: ostomy, jp drain Restrictions Weight Bearing Restrictions: No       Mobility Bed Mobility               General bed mobility comments: oob in recliner  Transfers Overall transfer level: Needs assistance Equipment used: None Transfers: Sit to/from Stand;Stand Pivot Transfers Sit to Stand: Supervision Stand pivot transfers: Supervision       General transfer comment: for safety.    Balance Overall balance assessment: Mild deficits observed, not formally tested                                         ADL either performed or assessed with clinical judgement   ADL Overall ADL's : Needs assistance/impaired     Grooming: Wash/dry face;Wash/dry hands;Oral care;Standing   Upper Body Bathing: Set up;Sitting   Lower Body Bathing: Minimal assistance;Sit to/from stand;Cueing for compensatory techniques;Cueing for sequencing;Cueing for safety   Upper Body Dressing : Set up;Sitting   Lower Body Dressing: Minimal assistance;Cueing for safety;Cueing for sequencing;With adaptive equipment   Toilet Transfer: Supervision/safety;RW;Ambulation;Cueing for safety   Toileting- Clothing Manipulation and Hygiene: Sit to/from  stand;Cueing for compensatory techniques;Cueing for safety;Min guard         General ADL Comments: Pt has an elevated toilet seat.  Pt has grab bars in bathroom.  Pt feeling luch better this day.     Vision Patient Visual Report: No change from baseline            Cognition Arousal/Alertness: Awake/alert Behavior During Therapy: WFL for tasks assessed/performed Overall Cognitive Status: Within Functional Limits for tasks assessed                                                     Pertinent Vitals/ Pain       Pain Assessment: No/denies pain         Frequency  Min 2X/week        Progress Toward Goals  OT Goals(current goals can now be found in the care plan section)     Acute Rehab OT Goals Patient Stated Goal: get stronger OT Goal Formulation: With patient Time For Goal Achievement: 12/13/18 Potential to Achieve Goals: Good  Plan Discharge plan remains appropriate       AM-PAC OT "6 Clicks" Daily Activity     Outcome Measure   Help from another person eating meals?: None Help from another person taking care of personal grooming?: None Help from another person toileting, which includes using toliet, bedpan, or urinal?: A Little Help  from another person bathing (including washing, rinsing, drying)?: A Little Help from another person to put on and taking off regular upper body clothing?: A Little Help from another person to put on and taking off regular lower body clothing?: A Little 6 Click Score: 20    End of Session Equipment Utilized During Treatment: Gait belt;Rolling walker  OT Visit Diagnosis: Muscle weakness (generalized) (M62.81)   Activity Tolerance Patient tolerated treatment well   Patient Left in chair;with call bell/phone within reach   Nurse Communication Mobility status        Time: 1443-1456 13 min  Charges: OT General Charges $OT Visit: 1 Visit OT Treatments $Self Care/Home Management : 23-37  mins  Kari Baars, [place] [phone] Office- [place], Edwena Felty D 12/01/2018, 5:22 PM

## 2018-12-02 LAB — GLUCOSE, CAPILLARY: Glucose-Capillary: 123 mg/dL — ABNORMAL HIGH (ref 70–99)

## 2018-12-02 MED ORDER — HEPARIN SOD (PORK) LOCK FLUSH 100 UNIT/ML IV SOLN
500.0000 [IU] | INTRAVENOUS | Status: DC | PRN
  Administered 2018-12-02: 500 [IU]
  Filled 2018-12-02 (×2): qty 5

## 2018-12-02 MED ORDER — HEPARIN SOD (PORK) LOCK FLUSH 100 UNIT/ML IV SOLN
500.0000 [IU] | INTRAVENOUS | Status: DC
  Filled 2018-12-02: qty 5

## 2018-12-02 MED ORDER — LIDOCAINE-PRILOCAINE 2.5-2.5 % EX CREA
TOPICAL_CREAM | Freq: Once | CUTANEOUS | Status: DC
  Filled 2018-12-02: qty 5

## 2018-12-02 NOTE — Consult Note (Signed)
[place] Nurse ostomy follow up Stoma type/location: RLQ ileal conduit.  Wife at bedside and performing pouch change  Stomal assessment/size: 1 1/4" pink and patent with red (right) stent and blue stent intact.  Explained rationale to wife.  Peristomal assessment: intact Treatment options for stomal/peristomal skin: Barrier ring and 1 piece convex urostomy pouch Output clear yellow liquid.  Attached to bedside drainage.  Wife demonstrated connecting and disconnecting.  Opening and closing pouch spout.  Understands rationale for bag and to disconnect when OOB for safety.  Ostomy pouching: 1pc.convex with barrier ring  Education provided: See above.  Patient wife removed old pouch, cleansed, measured and applied new pouch after cutting to fit.  She feels so much more comfortable now, she says.  I have provided 5 pouch sets of 1 piece convex.  They are enrolled in secure start and anticipate discharge today.  Enrolled patient in [place] Start Discharge program: Yes Will not follow at this time.  Please re-consult if needed.  Domenic Moras MSN, RN, FNP-BC CWON Wound, Ostomy, Continence Nurse Pager [phone]

## 2018-12-02 NOTE — Progress Notes (Signed)
6 Days Post-Op Subjective: Continues to do well. Tolerating PO. Ambulating. Seen by [facility] and PT. Ready for discharge. JP removed  Objective: Vital signs in last 24 hours: Temp:  [97.8 F (36.6 C)-99.1 F (37.3 C)] 97.8 F (36.6 C) (07/07 0351) Pulse Rate:  [71-87] 71 (07/07 0351) Resp:  [16] 16 (07/07 0351) BP: (128-146)/(67-72) 128/72 (07/07 0351) SpO2:  [95 %-100 %] 95 % (07/07 0351)  Intake/Output from previous day: 07/06 0701 - 07/07 0700 In: 810 [P.O.:800; I.V.:10] Out: 1975 [Urine:1725; Drains:250] Intake/Output this shift: No intake/output data recorded.  Physical Exam:  No acute distress-patient sitting in bed Abdomen-incision is clean dry and intact, Soft and nontender.  Ostomy pink and viable.  Stents in place.  JP removed Extremities-no calf pain or swelling.  Lab Results: Recent Labs    11/30/18 0357 12/01/18 0416  HGB 8.7* 8.8*  HCT 25.9* 25.4*   BMET Recent Labs    11/30/18 0357 12/01/18 0416  NA 138 140  K 4.0 3.9  CL 105 106  CO2 24 22  GLUCOSE 114* 118*  BUN 26* 19  CREATININE 1.08 1.09  CALCIUM 8.5* 8.6*   No results for input(s): LABPT, INR in the last 72 hours. No results for input(s): LABURIN in the last 72 hours. Results for orders placed or performed during the hospital encounter of 11/25/18  Surgical pcr screen     Status: None   Collection Time: 11/25/18 10:31 AM   Specimen: Nasal Mucosa; Nasal Swab  Result Value Ref Range Status   MRSA, PCR NEGATIVE NEGATIVE Final   Staphylococcus aureus NEGATIVE NEGATIVE Final    Comment: (NOTE) The Xpert SA Assay (FDA approved for NASAL specimens in patients 66 years of age and older), is one component of a comprehensive surveillance program. It is not intended to diagnose infection nor to guide or monitor treatment. Performed at [facility], [place] [address] [place], [place] [address]     Studies/Results: No results found.  Assessment/Plan:  POD#6 - doing well.    -- Will discharge after ostomy teaching   -- Please confirm [place] for ostomy and PT   LOS: 6 days   [name] 12/02/2018, 7:29 AM

## 2018-12-02 NOTE — Progress Notes (Signed)
Writer arrived @ bedside to de-access and re-access port. Patient states pending discharge today. Will hold off for now. Writer discussed with primary care RN. If patient is not discharged, huber needle will be changed by [facility]. EMLA cream placed @ bedside for later use if needed. Will communicate above to oncoming [facility] RN.

## 2018-12-02 NOTE — Progress Notes (Signed)

## 2018-12-02 NOTE — Progress Notes (Signed)
Discharge instructions given to patient and wife. Patient or wife had no questions. NT or writer will wheel patient out once he is dressed

## 2018-12-02 NOTE — Discharge Summary (Addendum)
[facility] Discharge Summary  Admit date: 11/25/2018  Discharge date and time: 12/02/18   Discharge to: Home  Discharge Service: Urology  Discharge Attending Physician:  Dr. Tresa Moore  Discharge  Diagnoses: Bladder cancer  OR Procedures: Procedure(s): ROBOT ASSISTED LAPAROSCOPIC RADICAL CYSTOPROSTATECTOMY BILATERAL PELVIC [place] WITH INJECTION OF INDOCYANINE GREEN DYE 11/26/2018   Ancillary Procedures: None   Discharge Day Services: The patient was seen and examined by the Urology team both in the morning and immediately prior to discharge.  Vital signs and laboratory values were stable and within normal limits.  The physical exam was benign and unchanged and all surgical wounds were examined.  JP was removed. Discharge instructions were explained and all questions answered.  Subjective  No acute events overnight. Pain Controlled. No fever or chills. JP removed. Patient not quite at baseline but feels ready for discharge.   Objective Patient Vitals for the past 8 hrs:  BP Temp Temp src Pulse Resp SpO2  12/02/18 0351 128/72 97.8 F (36.6 C) Oral 71 16 95 %   No intake/output data recorded.  General Appearance:        No acute distress Lungs:                       Normal work of breathing on room air Heart:                                Regular rate and rhythm Abdomen:                         Soft, non-tender, non-distended. Incisions healing well. JP removed. RLQ urostomy pink and healthy, pouch holding, stents visible.  Extremities:                      Warm and well perfused   Hospital Course:  The patient underwent Robotic cystoprostatectomy and node dissection on 11/26/2018.  The patient tolerated the procedure well, was extubated in the OR, and afterwards was taken to the PACU for routine post-surgical care. Initially taken to stepdown and then when stable the patient was transferred to the floor.    The patient did well  postoperatively. No major complications post op and followed routine post op course.   The patient's diet was slowly advanced and at the time of discharge was tolerating a regular diet.  The patient was discharged home 6 Days Post-Op, at which point was tolerating a regular solid diet,was able to adequately care for his urostomy (received several teachings, including one with wife), and have adequate pain control with P.O. pain medication, and could ambulate with assistance of a walker (which was ordered by PT for home).   The patient will follow up with [place] for post op check and ureteral stent removal. Pathology was not discussed prior to discharge  Condition at Discharge: Improved  Discharge Medications:  Allergies as of 12/02/2018      Reactions   Sulfa Antibiotics Hives      Medication List    STOP taking these medications   tamsulosin 0.4 MG Caps capsule Commonly known as: FLOMAX     TAKE these medications   acetaminophen 500 MG tablet Commonly known as: TYLENOL Take 1,000 mg by mouth 2 (two) times a day.   CeraVe Crea Apply 1 application topically 2 (two) times daily.   DIGESTIVE ENZYME  PO Take 1 capsule by mouth 2 (two) times daily.   lidocaine-prilocaine cream Commonly known as: EMLA Apply 1 application topically as needed.   LORazepam 0.5 MG tablet Commonly known as: ATIVAN Place 1 tablet (0.5 mg total) under the tongue every 8 (eight) hours as needed (nausea).   magnesium oxide 400 MG tablet Commonly known as: MAG-OX TAKE 1 TABLET BY MOUTH EVERY DAY   metFORMIN 500 MG tablet Commonly known as: GLUCOPHAGE Take 500 mg by mouth daily with breakfast.   MiraLax 17 GM/SCOOP powder Generic drug: polyethylene glycol powder Take 1 Container by mouth daily.   OVER THE COUNTER MEDICATION Take 240 mLs by mouth daily. Asca Liquid Supplement   OVER THE COUNTER MEDICATION 1,000 mg 2 (two) times daily. [place]   OVER THE COUNTER MEDICATION CTFO super fruit chew    OVER THE COUNTER MEDICATION Alive Men's 50+ gummy   OVER THE COUNTER MEDICATION Elderberry syrup with turmeric   PROBIOTIC DAILY PO Take 1 capsule by mouth daily.   prochlorperazine 10 MG tablet Commonly known as: COMPAZINE TAKE 1 TABLET BY MOUTH EVERY 6 HOURS AS NEEDED FOR NAUSEA OR VOMITING. What changed: See the new instructions.   senna-docusate 8.6-50 MG tablet Commonly known as: Senna S Take 1 tablet by mouth 2 (two) times daily.   tolnaftate 1 % spray Commonly known as: TINACTIN Apply 1 spray topically daily as needed (for feet itching).   triamcinolone cream 0.1 % Commonly known as: KENALOG APPLY TO AFFECTED AREA TWICE A DAY What changed: See the new instructions.   UNABLE TO FIND 1,000 mg 3 (three) times daily. Med Name:[facility]   VITAMIN B-12 PO Take 4,000 mcg by mouth daily.   Vitamin D3 125 MCG (5000 UT) Caps Take 5,000 Units by mouth daily.   Zinc 30 MG Tabs Take 1 tablet by mouth daily.

## 2018-12-02 NOTE — TOC Initial Note (Signed)
Transition of Care [facility]) - Initial/Assessment Note    Patient Details  Name: Sergio Pennington MRN: [id number] Date of Birth: Oct 31, 1950  Transition of Care [facility]) CM/SW Contact:    Joaquin Courts, RN Phone Number: 12/02/2018, 9:53 AM  Clinical Narrative:   Cm spoke with patient at bedside who reports he had [facility] services with [facility] prior to his hospital admission and would like to continue services. [facility] rep, Cassie, notified. Services for [facility] and PT arranged. Rolling walker to be delivered to room for home use by [facility].                Expected Discharge Plan: [place] Barriers to Discharge: No Barriers Identified   Patient Goals and CMS Choice Patient states their goals for this hospitalization and ongoing recovery are:: to go home with [facility] CMS Medicare.gov Compare Post Acute Care list provided to:: Patient Choice offered to / list presented to : Patient  Expected Discharge Plan and Services Expected Discharge Plan: [place]   Discharge Planning Services: CM Consult Post Acute Care Choice: Home Health, Durable Medical Equipment Living arrangements for the past 2 months: Single Family Home Expected Discharge Date: 12/02/18               DME Arranged: Gilford Rile rolling DME Agency: [facility] Date DME Agency Contacted: 12/02/18 Time DME Agency Contacted: [phone] Representative spoke with at DME Agency: Thedore Mins HH Arranged: RN, PT [place] Agency: [facility] [place] Date [place]: 12/02/18 Time [place]: [phone] Representative spoke with at [place]: cassie  Prior Living Arrangements/Services Living arrangements for the past 2 months: [place] with:: Spouse Patient language and need for interpreter reviewed:: Yes Do you feel safe going back to the place where you live?: Yes      Need for Family Participation in Patient Care: Yes (Comment) Care giver support system in place?: Yes  (comment)   Criminal Activity/Legal Involvement Pertinent to Current Situation/Hospitalization: No - Comment as needed  Activities of Daily Living Home Assistive Devices/Equipment: Eyeglasses ADL Screening (condition at time of admission) Patient's cognitive ability adequate to safely complete daily activities?: Yes Is the patient deaf or have difficulty hearing?: No Does the patient have difficulty seeing, even when wearing glasses/contacts?: No Does the patient have difficulty concentrating, remembering, or making decisions?: No Patient able to express need for assistance with ADLs?: Yes Does the patient have difficulty dressing or bathing?: No Independently performs ADLs?: Yes (appropriate for developmental age) Does the patient have difficulty walking or climbing stairs?: No Weakness of Legs: None Weakness of Arms/Hands: None  Permission Sought/Granted   Permission granted to share information with : Yes, Verbal Permission Granted        Permission granted to share info w Relationship: spouse     Emotional Assessment Appearance:: Appears stated age Attitude/Demeanor/Rapport: Engaged Affect (typically observed): Accepting Orientation: : Oriented to Place, Oriented to  Time, Oriented to Situation, Oriented to Self   Psych Involvement: No (comment)  Admission diagnosis:  BLADDER CANCER Patient Active Problem List   Diagnosis Date Noted  . Port-A-Cath in place 07/03/2018  . Bladder cancer ([place]) 06/17/2018  . DIAB W/O MENTION COMP TYPE II/UNS TYPE UNCNTRL 03/08/2008  . DIABETES MELLITUS, TYPE II, UNCONTROLLED, W/NEUROLO COMPS 03/08/2008  . HYPERLIPIDEMIA 03/08/2008  . TOBACCO ABUSE 03/08/2008   PCP:  Darreld Mclean, MD Pharmacy:   [id number] - [place], [address] [place]  [place] [address] Phone: [phone] Fax: [phone]  [facility] [address], [place] [address] Phone: [phone] Fax: [phone]     Social Determinants of Health (SDOH) Interventions    Readmission Risk Interventions No flowsheet data found.

## 2018-12-12 ENCOUNTER — Other Ambulatory Visit: Payer: Self-pay | Admitting: Family Medicine

## 2018-12-12 DIAGNOSIS — E119 Type 2 diabetes mellitus without complications: Secondary | ICD-10-CM

## 2018-12-13 ENCOUNTER — Other Ambulatory Visit: Payer: Self-pay | Admitting: Oncology

## 2018-12-30 ENCOUNTER — Other Ambulatory Visit: Payer: Self-pay

## 2018-12-30 ENCOUNTER — Inpatient Hospital Stay: Payer: Medicare Other | Attending: Oncology | Admitting: Oncology

## 2018-12-30 ENCOUNTER — Inpatient Hospital Stay: Payer: Medicare Other

## 2018-12-30 VITALS — BP 142/70 | HR 73 | Temp 99.1°F | Resp 17 | Ht 72.0 in | Wt 168.3 lb

## 2018-12-30 DIAGNOSIS — Z9221 Personal history of antineoplastic chemotherapy: Secondary | ICD-10-CM | POA: Insufficient documentation

## 2018-12-30 DIAGNOSIS — C678 Malignant neoplasm of overlapping sites of bladder: Secondary | ICD-10-CM

## 2018-12-30 DIAGNOSIS — Z95828 Presence of other vascular implants and grafts: Secondary | ICD-10-CM

## 2018-12-30 DIAGNOSIS — C775 Secondary and unspecified malignant neoplasm of intrapelvic lymph nodes: Secondary | ICD-10-CM | POA: Insufficient documentation

## 2018-12-30 DIAGNOSIS — D649 Anemia, unspecified: Secondary | ICD-10-CM | POA: Insufficient documentation

## 2018-12-30 DIAGNOSIS — C679 Malignant neoplasm of bladder, unspecified: Secondary | ICD-10-CM | POA: Insufficient documentation

## 2018-12-30 LAB — CBC WITH DIFFERENTIAL (CANCER CENTER ONLY)
Abs Immature Granulocytes: 0.04 10*3/uL (ref 0.00–0.07)
Basophils Absolute: 0.1 10*3/uL (ref 0.0–0.1)
Basophils Relative: 1 %
Eosinophils Absolute: 0.6 10*3/uL — ABNORMAL HIGH (ref 0.0–0.5)
Eosinophils Relative: 7 %
HCT: 28.9 % — ABNORMAL LOW (ref 39.0–52.0)
Hemoglobin: 9.9 g/dL — ABNORMAL LOW (ref 13.0–17.0)
Immature Granulocytes: 1 %
Lymphocytes Relative: 16 %
Lymphs Abs: 1.4 10*3/uL (ref 0.7–4.0)
MCH: 32.7 pg (ref 26.0–34.0)
MCHC: 34.3 g/dL (ref 30.0–36.0)
MCV: 95.4 fL (ref 80.0–100.0)
Monocytes Absolute: 0.9 10*3/uL (ref 0.1–1.0)
Monocytes Relative: 11 %
Neutro Abs: 5.6 10*3/uL (ref 1.7–7.7)
Neutrophils Relative %: 64 %
Platelet Count: 220 10*3/uL (ref 150–400)
RBC: 3.03 MIL/uL — ABNORMAL LOW (ref 4.22–5.81)
RDW: 12.6 % (ref 11.5–15.5)
WBC Count: 8.6 10*3/uL (ref 4.0–10.5)
nRBC: 0 % (ref 0.0–0.2)

## 2018-12-30 LAB — CMP (CANCER CENTER ONLY)
ALT: 14 U/L (ref 0–44)
AST: 17 U/L (ref 15–41)
Albumin: 3.4 g/dL — ABNORMAL LOW (ref 3.5–5.0)
Alkaline Phosphatase: 91 U/L (ref 38–126)
Anion gap: 9 (ref 5–15)
BUN: 37 mg/dL — ABNORMAL HIGH (ref 8–23)
CO2: 27 mmol/L (ref 22–32)
Calcium: 9.4 mg/dL (ref 8.9–10.3)
Chloride: 103 mmol/L (ref 98–111)
Creatinine: 1.42 mg/dL — ABNORMAL HIGH (ref 0.61–1.24)
GFR, Est AFR Am: 58 mL/min — ABNORMAL LOW (ref >60)
GFR, Estimated: 50 mL/min — ABNORMAL LOW (ref >60)
Glucose, Bld: 183 mg/dL — ABNORMAL HIGH (ref 70–99)
Potassium: 4.9 mmol/L (ref 3.5–5.1)
Sodium: 139 mmol/L (ref 135–145)
Total Bilirubin: 0.3 mg/dL (ref 0.3–1.2)
Total Protein: 6.8 g/dL (ref 6.5–8.1)

## 2018-12-30 LAB — MAGNESIUM: Magnesium: 1.9 mg/dL (ref 1.7–2.4)

## 2018-12-30 MED ORDER — SODIUM CHLORIDE 0.9% FLUSH
10.0000 mL | Freq: Once | INTRAVENOUS | Status: AC
  Administered 2018-12-30: 10 mL
  Filled 2018-12-30: qty 10

## 2018-12-30 MED ORDER — HEPARIN SOD (PORK) LOCK FLUSH 100 UNIT/ML IV SOLN
500.0000 [IU] | Freq: Once | INTRAVENOUS | Status: AC
  Administered 2018-12-30: 500 [IU]
  Filled 2018-12-30: qty 5

## 2018-12-30 NOTE — Progress Notes (Signed)
Hematology and Oncology Follow Up Visit  SOTA HETZ [id number] 18-Apr-1951 68 y.o. 12/30/2018 2:17 PM [name], [name], MDCopland, [name], MD   Principle Diagnosis: 68 year old man with bladder cancer diagnosed in January 2020.  He presented with stage IV disease with pelvic adenopathy.  Prior Therapy:  Status post TURBT on January 28 of 2020 showed a large tumor with the pathology showed high-grade urothelial carcinoma.  Chemotherapy utilizing cisplatin and gemcitabine on day 1 and gemcitabine on day 8 out of a 21-day cycle.  He completed 5 cycles of therapy on April 29 of 2020.  He is status post radical cystectomy completed on November 26, 2018.  The final pathology showed infiltrating high-grade urothelial carcinoma with micropapillary variant invading the muscle into the perivesical soft tissue with 3 out of 4 lymph nodes involved.  Pathological staging was T3PN1.   Current therapy: Active surveillance.  Interim History: [name] is here for a follow-up.  Since last visit, he underwent radical cystectomy under the care of Dr. Tresa Moore which she has tolerated very well and is fully recovering at this time.  He denies any recent recent complications at this time.  He denies any abdominal pain or discomfort.  Denies any fevers or chills.  Denies any flank pain.  His appetite is improving he has regained most activities of daily living.  Is ambulating without help of a walker or cane.  No falls.  Patient denied any alteration mental status, neuropathy, confusion or dizziness.  Denies any headaches or lethargy.  Denies any night sweats, weight loss or changes in appetite.  Denied orthopnea, dyspnea on exertion or chest discomfort.  Denies shortness of breath, difficulty breathing hemoptysis or cough.  Denies any abdominal distention, nausea, early satiety or dyspepsia.  Denies any hematuria, frequency, dysuria or nocturia.  Denies any skin irritation, dryness or rash.   Denies any ecchymosis or petechiae.  Denies any lymphadenopathy or clotting.  Denies any heat or cold intolerance.  Denies any anxiety or depression.  Remaining review of system is negative.    Medications: Updated without any changes. Current Outpatient Medications  Medication Sig Dispense Refill  . acetaminophen (TYLENOL) 500 MG tablet Take 1,000 mg by mouth 2 (two) times a day.    . Cholecalciferol (VITAMIN D3) 125 MCG (5000 UT) CAPS Take 5,000 Units by mouth daily.    . Cyanocobalamin (VITAMIN B-12 PO) Take 4,000 mcg by mouth daily.     . Digestive Enzymes (DIGESTIVE ENZYME PO) Take 1 capsule by mouth 2 (two) times daily.     . Emollient (CERAVE) CREA Apply 1 application topically 2 (two) times daily.    Marland Kitchen lidocaine-prilocaine (EMLA) cream Apply 1 application topically as needed. 30 g 0  . LORazepam (ATIVAN) 0.5 MG tablet Place 1 tablet (0.5 mg total) under the tongue every 8 (eight) hours as needed (nausea). 30 tablet 0  . magnesium oxide (MAG-OX) 400 MG tablet TAKE 1 TABLET BY MOUTH EVERY DAY 30 tablet 0  . metFORMIN (GLUCOPHAGE) 500 MG tablet TAKE 1 TABLET BY MOUTH 2 (TWO) TIMES DAILY WITH A MEAL. MAKE OFFICE VISIT FOR REFILLS 180 tablet 1  . OVER THE COUNTER MEDICATION Take 240 mLs by mouth daily. Asca Liquid Supplement    . OVER THE COUNTER MEDICATION 1,000 mg 2 (two) times daily. Graviola    . OVER THE COUNTER MEDICATION CTFO super fruit chew    . OVER THE COUNTER MEDICATION Alive Men's 50+ gummy    . OVER THE COUNTER MEDICATION Elderberry  syrup with turmeric    . polyethylene glycol powder (MIRALAX) 17 GM/SCOOP powder Take 1 Container by mouth daily.     . Probiotic Product (PROBIOTIC DAILY PO) Take 1 capsule by mouth daily.    . prochlorperazine (COMPAZINE) 10 MG tablet TAKE 1 TABLET BY MOUTH EVERY 6 HOURS AS NEEDED FOR NAUSEA OR VOMITING. (Patient taking differently: Take 10 mg by mouth every 6 (six) hours as needed for nausea or vomiting. ) 30 tablet 0  . senna-docusate (SENNA S)  8.6-50 MG tablet Take 1 tablet by mouth 2 (two) times daily. 90 tablet 3  . tolnaftate (TINACTIN) 1 % spray Apply 1 spray topically daily as needed (for feet itching).    . triamcinolone cream (KENALOG) 0.1 % APPLY TO AFFECTED AREA TWICE A DAY (Patient taking differently: Apply 1 application topically 2 (two) times daily as needed (for itching). ) 80 g 1  . UNABLE TO FIND 1,000 mg 3 (three) times daily. Med Name:AHCC    . Zinc 30 MG TABS Take 1 tablet by mouth daily.      No current facility-administered medications for this visit.      Allergies:  Allergies  Allergen Reactions  . Sulfa Antibiotics Hives    Past Medical History, Surgical history, Social history, and Family History without any changes on review.    Physical Exam:       ECOG: 1    General appearance: Alert, awake without any distress. Head: Atraumatic without abnormalities Oropharynx: Without any thrush or ulcers. Eyes: No scleral icterus. Lymph nodes: No lymphadenopathy noted in the cervical, supraclavicular, or axillary nodes Heart:regular rate and rhythm, without any murmurs or gallops.   Lung: Clear to auscultation without any rhonchi, wheezes or dullness to percussion. Abdomin: Soft, nontender without any shifting dullness or ascites. Musculoskeletal: No clubbing or cyanosis. Neurological: No motor or sensory deficits. Skin: No rashes or lesions. Psychiatric: Mood and affect appeared normal.     .       Lab Results: Lab Results  Component Value Date   WBC 9.6 11/25/2018   HGB 8.8 (L) 12/01/2018   HCT 25.4 (L) 12/01/2018   MCV 100.0 11/25/2018   PLT 195 11/25/2018     Chemistry      Component Value Date/Time   NA 140 12/01/2018 0416   K 3.9 12/01/2018 0416   CL 106 12/01/2018 0416   CO2 22 12/01/2018 0416   BUN 19 12/01/2018 0416   CREATININE 1.09 12/01/2018 0416   CREATININE 1.40 (H) 10/28/2018 0908   CREATININE 1.05 01/17/2015 1559      Component Value Date/Time    CALCIUM 8.6 (L) 12/01/2018 0416   ALKPHOS 69 11/25/2018 1354   AST 17 11/25/2018 1354   AST 14 (L) 10/28/2018 0908   ALT 15 11/25/2018 1354   ALT 14 10/28/2018 0908   BILITOT 0.7 11/25/2018 1354   BILITOT 0.2 (L) 10/28/2018 0908       Impression and Plan:  68 year old man with:  1.  Bladder cancer diagnosed in January 2020.  He was found to have stage IV disease with lymph node involvement.  He had an excellent response to chemotherapy followed by radical cystectomy.  He is recovering well from his recent surgery and has regained most activities of daily living.  The natural course of this disease and risk of relapse was assessed at this time.  He still has high risk disease given his pelvic adenopathy and residual disease on surgery.  The role of maintenance immunotherapy  in this particular setting is unknown.  Risks and benefits of treatment now versus upon recurrence was debated.  After discussion we have opted to continue with active surveillance and reinstitute systemic therapy upon relapse.  2.  IV access: Port-A-Cath will remain in place and flushed periodically.   3.  Renal function surveillance: No residual complications related line therapy.  4.  Anemia: Improved after discontinuation of chemotherapy and recovering from surgery.  His hemoglobin remains adequate.  5.  Follow-up: In 2 weeks for Port-A-Cath flush and in 4 weeks for repeat evaluation.  15  minutes was spent with the patient face-to-face today.  More than 50% of time was spent on reviewing his disease status, treatment options, complications related therapy and future plan of care.    Zola Button, MD 8/4/20202:17 PM

## 2018-12-30 NOTE — Patient Instructions (Signed)

## 2018-12-31 ENCOUNTER — Telehealth: Payer: Self-pay | Admitting: Oncology

## 2018-12-31 NOTE — Telephone Encounter (Signed)
Scheduled appt per 8/4 sch message - pt wife aware of appt added

## 2019-01-06 ENCOUNTER — Other Ambulatory Visit: Payer: Self-pay | Admitting: Oncology

## 2019-03-02 ENCOUNTER — Inpatient Hospital Stay: Payer: Medicare Other | Attending: Oncology

## 2019-03-02 ENCOUNTER — Other Ambulatory Visit: Payer: Self-pay

## 2019-03-02 DIAGNOSIS — Z95828 Presence of other vascular implants and grafts: Secondary | ICD-10-CM

## 2019-03-02 DIAGNOSIS — C678 Malignant neoplasm of overlapping sites of bladder: Secondary | ICD-10-CM

## 2019-03-02 DIAGNOSIS — C679 Malignant neoplasm of bladder, unspecified: Secondary | ICD-10-CM | POA: Insufficient documentation

## 2019-03-02 DIAGNOSIS — Z452 Encounter for adjustment and management of vascular access device: Secondary | ICD-10-CM | POA: Insufficient documentation

## 2019-03-02 MED ORDER — SODIUM CHLORIDE 0.9% FLUSH
10.0000 mL | Freq: Once | INTRAVENOUS | Status: AC
  Administered 2019-03-02: 10 mL
  Filled 2019-03-02: qty 10

## 2019-03-02 MED ORDER — HEPARIN SOD (PORK) LOCK FLUSH 100 UNIT/ML IV SOLN
500.0000 [IU] | Freq: Once | INTRAVENOUS | Status: AC
  Administered 2019-03-02: 500 [IU]
  Filled 2019-03-02: qty 5

## 2019-03-11 ENCOUNTER — Other Ambulatory Visit: Payer: Self-pay | Admitting: Family Medicine

## 2019-03-11 DIAGNOSIS — L299 Pruritus, unspecified: Secondary | ICD-10-CM

## 2019-04-25 ENCOUNTER — Encounter: Payer: Self-pay | Admitting: Oncology

## 2019-05-05 ENCOUNTER — Encounter: Payer: Self-pay | Admitting: Oncology

## 2019-05-05 ENCOUNTER — Inpatient Hospital Stay: Payer: Medicare Other

## 2019-05-05 ENCOUNTER — Telehealth: Payer: Self-pay | Admitting: Radiation Oncology

## 2019-05-05 ENCOUNTER — Other Ambulatory Visit: Payer: Self-pay

## 2019-05-05 ENCOUNTER — Inpatient Hospital Stay: Payer: Medicare Other | Attending: Oncology | Admitting: Oncology

## 2019-05-05 VITALS — BP 136/60 | HR 82 | Temp 98.5°F | Resp 17 | Ht 72.0 in | Wt 169.6 lb

## 2019-05-05 DIAGNOSIS — C678 Malignant neoplasm of overlapping sites of bladder: Secondary | ICD-10-CM

## 2019-05-05 DIAGNOSIS — Z79899 Other long term (current) drug therapy: Secondary | ICD-10-CM | POA: Diagnosis not present

## 2019-05-05 DIAGNOSIS — D6481 Anemia due to antineoplastic chemotherapy: Secondary | ICD-10-CM | POA: Insufficient documentation

## 2019-05-05 DIAGNOSIS — C679 Malignant neoplasm of bladder, unspecified: Secondary | ICD-10-CM | POA: Insufficient documentation

## 2019-05-05 DIAGNOSIS — R232 Flushing: Secondary | ICD-10-CM | POA: Insufficient documentation

## 2019-05-05 DIAGNOSIS — Z882 Allergy status to sulfonamides status: Secondary | ICD-10-CM | POA: Insufficient documentation

## 2019-05-05 DIAGNOSIS — Z95828 Presence of other vascular implants and grafts: Secondary | ICD-10-CM

## 2019-05-05 DIAGNOSIS — R590 Localized enlarged lymph nodes: Secondary | ICD-10-CM | POA: Insufficient documentation

## 2019-05-05 DIAGNOSIS — K5900 Constipation, unspecified: Secondary | ICD-10-CM | POA: Insufficient documentation

## 2019-05-05 LAB — CMP (CANCER CENTER ONLY)
ALT: 15 U/L (ref 0–44)
AST: 17 U/L (ref 15–41)
Albumin: 3.6 g/dL (ref 3.5–5.0)
Alkaline Phosphatase: 84 U/L (ref 38–126)
Anion gap: 11 (ref 5–15)
BUN: 35 mg/dL — ABNORMAL HIGH (ref 8–23)
CO2: 27 mmol/L (ref 22–32)
Calcium: 9.2 mg/dL (ref 8.9–10.3)
Chloride: 100 mmol/L (ref 98–111)
Creatinine: 1.62 mg/dL — ABNORMAL HIGH (ref 0.61–1.24)
GFR, Est AFR Am: 49 mL/min — ABNORMAL LOW (ref >60)
GFR, Estimated: 42 mL/min — ABNORMAL LOW (ref >60)
Glucose, Bld: 188 mg/dL — ABNORMAL HIGH (ref 70–99)
Potassium: 5 mmol/L (ref 3.5–5.1)
Sodium: 138 mmol/L (ref 135–145)
Total Bilirubin: 0.3 mg/dL (ref 0.3–1.2)
Total Protein: 7.1 g/dL (ref 6.5–8.1)

## 2019-05-05 LAB — CBC WITH DIFFERENTIAL (CANCER CENTER ONLY)
Abs Immature Granulocytes: 0.04 10*3/uL (ref 0.00–0.07)
Basophils Absolute: 0.1 10*3/uL (ref 0.0–0.1)
Basophils Relative: 1 %
Eosinophils Absolute: 0.1 10*3/uL (ref 0.0–0.5)
Eosinophils Relative: 1 %
HCT: 30.9 % — ABNORMAL LOW (ref 39.0–52.0)
Hemoglobin: 10.4 g/dL — ABNORMAL LOW (ref 13.0–17.0)
Immature Granulocytes: 0 %
Lymphocytes Relative: 16 %
Lymphs Abs: 1.4 10*3/uL (ref 0.7–4.0)
MCH: 30.8 pg (ref 26.0–34.0)
MCHC: 33.7 g/dL (ref 30.0–36.0)
MCV: 91.4 fL (ref 80.0–100.0)
Monocytes Absolute: 0.8 10*3/uL (ref 0.1–1.0)
Monocytes Relative: 9 %
Neutro Abs: 6.6 10*3/uL (ref 1.7–7.7)
Neutrophils Relative %: 73 %
Platelet Count: 230 10*3/uL (ref 150–400)
RBC: 3.38 MIL/uL — ABNORMAL LOW (ref 4.22–5.81)
RDW: 13.4 % (ref 11.5–15.5)
WBC Count: 9.1 10*3/uL (ref 4.0–10.5)
nRBC: 0 % (ref 0.0–0.2)

## 2019-05-05 LAB — MAGNESIUM: Magnesium: 1.9 mg/dL (ref 1.7–2.4)

## 2019-05-05 MED ORDER — HEPARIN SOD (PORK) LOCK FLUSH 100 UNIT/ML IV SOLN
500.0000 [IU] | Freq: Once | INTRAVENOUS | Status: AC
  Administered 2019-05-05: 500 [IU]
  Filled 2019-05-05: qty 5

## 2019-05-05 MED ORDER — SODIUM CHLORIDE 0.9% FLUSH
10.0000 mL | Freq: Once | INTRAVENOUS | Status: AC
  Administered 2019-05-05: 10 mL
  Filled 2019-05-05: qty 10

## 2019-05-05 NOTE — Telephone Encounter (Signed)
That is correct.  He is scheduled to have that done by Dr. Tresa Moore at [facility].

## 2019-05-05 NOTE — Telephone Encounter (Signed)
Received voicemail message from [place], [place] for Dr. Alen Blew. She questions when the CT scan for this patient will be scheduled. Phoned Marita Kansas, RN back. No answer. Upon further investigation into the chart this RN noted that Dr. Tyler Pita have never seen this patient nor ordered scan but did not that Dr. Alexis Frock has. Left voicemail message explaining such and provided number to [facility].

## 2019-05-05 NOTE — Progress Notes (Signed)
Hematology and Oncology Follow Up Visit  STEFFON GLADU [id number] 1950/06/26 68 y.o. 05/05/2019 10:35 AM [name], [name], MDCopland, [name], MD   Principle Diagnosis: 68 year old man with stage IV bladder cancer presented with pelvic adenopathy diagnosed in January 2020.  He was found to have pathological stage T3N1.  Prior Therapy:  Status post TURBT on January 28 of 2020 showed a large tumor with the pathology showed high-grade urothelial carcinoma.  Chemotherapy utilizing cisplatin and gemcitabine on day 1 and gemcitabine on day 8 out of a 21-day cycle.  He completed 5 cycles of therapy on April 29 of 2020.  He is status post radical cystectomy completed on November 26, 2018.  The final pathology showed infiltrating high-grade urothelial carcinoma with micropapillary variant invading the muscle into the perivesical soft tissue with 3 out of 4 lymph nodes involved.  Pathological staging was T3PN1.   Current therapy: Active surveillance.  Interim History: [name] returns today for a repeat evaluation.  Since the last visit, he continues to recover reasonably well without any recent complications or hospitalizations.  He is regaining a lot of activities of daily living.  He is eating better and has gained weight.  He denies any recent hematuria or dysuria.  He is reporting some abdominal and back fullness at times associated with some constipation.  He also reported some discomfort in his lower back especially at nighttime as he sleeps on his back regularly.  Quality of life performance status remains unchanged.  He denied headaches, blurry vision, syncope or seizures.  Denies any fevers, chills or sweats.  Denied chest pain, palpitation, orthopnea or leg edema.  Denied cough, wheezing or hemoptysis.  Denied nausea, vomiting or abdominal pain.  Denies any constipation or diarrhea.  Denies any frequency urgency or hesitancy.  Denies any arthralgias or myalgias.   Denies any skin rashes or lesions.  Denies any bleeding or clotting tendency.  Denies any easy bruising.  Denies any hair or nail changes.  Denies any anxiety or depression.  Remaining review of system is negative.    Medications: Without any changes on review. Current Outpatient Medications  Medication Sig Dispense Refill  . acetaminophen (TYLENOL) 500 MG tablet Take 1,000 mg by mouth 2 (two) times a day.    . Cholecalciferol (VITAMIN D3) 125 MCG (5000 UT) CAPS Take 5,000 Units by mouth daily.    . Cyanocobalamin (VITAMIN B-12 PO) Take 4,000 mcg by mouth daily.     . Digestive Enzymes (DIGESTIVE ENZYME PO) Take 1 capsule by mouth 2 (two) times daily.     . Emollient (CERAVE) CREA Apply 1 application topically 2 (two) times daily.    Marland Kitchen lidocaine-prilocaine (EMLA) cream Apply 1 application topically as needed. 30 g 0  . LORazepam (ATIVAN) 0.5 MG tablet Place 1 tablet (0.5 mg total) under the tongue every 8 (eight) hours as needed (nausea). 30 tablet 0  . magnesium oxide (MAG-OX) 400 MG tablet TAKE 1 TABLET BY MOUTH EVERY DAY 30 tablet 0  . metFORMIN (GLUCOPHAGE) 500 MG tablet TAKE 1 TABLET BY MOUTH 2 (TWO) TIMES DAILY WITH A MEAL. MAKE OFFICE VISIT FOR REFILLS 180 tablet 1  . OVER THE COUNTER MEDICATION Take 240 mLs by mouth daily. Asca Liquid Supplement    . OVER THE COUNTER MEDICATION 1,000 mg 2 (two) times daily. Graviola    . OVER THE COUNTER MEDICATION CTFO super fruit chew    . OVER THE COUNTER MEDICATION Alive Men's 50+ gummy    . OVER  THE COUNTER MEDICATION Elderberry syrup with turmeric    . polyethylene glycol powder (MIRALAX) 17 GM/SCOOP powder Take 1 Container by mouth daily.     . Probiotic Product (PROBIOTIC DAILY PO) Take 1 capsule by mouth daily.    . prochlorperazine (COMPAZINE) 10 MG tablet TAKE 1 TABLET BY MOUTH EVERY 6 HOURS AS NEEDED FOR NAUSEA OR VOMITING. (Patient taking differently: Take 10 mg by mouth every 6 (six) hours as needed for nausea or vomiting. ) 30 tablet 0   . senna-docusate (SENNA S) 8.6-50 MG tablet Take 1 tablet by mouth 2 (two) times daily. 90 tablet 3  . tolnaftate (TINACTIN) 1 % spray Apply 1 spray topically daily as needed (for feet itching).    . triamcinolone cream (KENALOG) 0.1 % Apply 1 application topically 2 (two) times daily as needed (for itching). 80 g 1  . UNABLE TO FIND 1,000 mg 3 (three) times daily. Med Name:AHCC    . Zinc 30 MG TABS Take 1 tablet by mouth daily.      No current facility-administered medications for this visit.      Allergies:  Allergies  Allergen Reactions  . Sulfa Antibiotics Hives    Past Medical History, Surgical history, Social history, and Family History updated without any changes.    Physical Exam:    Blood pressure 136/60, pulse 82, temperature 98.5 F (36.9 C), temperature source Temporal, resp. rate 17, height 6' (1.829 m), weight 169 lb 9.6 oz (76.9 kg), SpO2 97 %.    ECOG: 1     General appearance: Comfortable appearing without any discomfort Head: Normocephalic without any trauma Oropharynx: Mucous membranes are moist and pink without any thrush or ulcers. Eyes: Pupils are equal and round reactive to light. Lymph nodes: No cervical, supraclavicular, inguinal or axillary lymphadenopathy.   Heart:regular rate and rhythm.  S1 and S2 without leg edema. Lung: Clear without any rhonchi or wheezes.  No dullness to percussion. Abdomin: Soft, nontender, nondistended with good bowel sounds.  No hepatosplenomegaly. Musculoskeletal: No joint deformity or effusion.  Full range of motion noted. Neurological: No deficits noted on motor, sensory and deep tendon reflex exam. Skin: No petechial rash or dryness.  Appeared moist.  Psychiatric: Mood and affect appeared appropriate.      .       Lab Results: Lab Results  Component Value Date   WBC 9.1 05/05/2019   HGB 10.4 (L) 05/05/2019   HCT 30.9 (L) 05/05/2019   MCV 91.4 05/05/2019   PLT 230 05/05/2019     Chemistry       Component Value Date/Time   NA 139 12/30/2018 1355   K 4.9 12/30/2018 1355   CL 103 12/30/2018 1355   CO2 27 12/30/2018 1355   BUN 37 (H) 12/30/2018 1355   CREATININE 1.42 (H) 12/30/2018 1355   CREATININE 1.05 01/17/2015 1559      Component Value Date/Time   CALCIUM 9.4 12/30/2018 1355   ALKPHOS 91 12/30/2018 1355   AST 17 12/30/2018 1355   ALT 14 12/30/2018 1355   BILITOT 0.3 12/30/2018 1355       Impression and Plan:  68 year old man with:  1.  Stage IV bladder cancer diagnosed in January 2020 with a pelvic adenopathy.  He is status post therapy outlined above including radical surgery after neoadjuvant chemotherapy.  He continues to be on active surveillance at this time with a CT scan scheduled later this month under the care of Dr. Tammi Klippel.  He understands that different salvage  therapy may be needed if he develops recurrent disease which he is at high risk of doing so.  2.  IV access: Port-A-Cath continues to be in place and will be flushed periodically.   3.  Anemia: Related to malignancy and chemotherapy.  Hemoglobin is improving..  4.  Follow-up: Every 2 months for Port-A-Cath flush and MD follow-up in 4 months.  15  minutes was spent with the patient face-to-face today.  More than 50% of time was dedicated to reviewing his disease status, treatment options and answering questions regarding future plan of care.    Zola Button, MD 12/8/202010:35 AM

## 2019-05-06 ENCOUNTER — Telehealth: Payer: Self-pay | Admitting: Oncology

## 2019-05-06 NOTE — Telephone Encounter (Signed)
Scheduled per los. Called and spoke with patients wife. Confirmed appts 

## 2019-05-11 ENCOUNTER — Encounter: Payer: Self-pay | Admitting: Oncology

## 2019-05-11 ENCOUNTER — Other Ambulatory Visit: Payer: Self-pay

## 2019-05-11 ENCOUNTER — Encounter (HOSPITAL_COMMUNITY): Payer: Self-pay | Admitting: Emergency Medicine

## 2019-05-11 ENCOUNTER — Ambulatory Visit (INDEPENDENT_AMBULATORY_CARE_PROVIDER_SITE_OTHER)
Admission: EM | Admit: 2019-05-11 | Discharge: 2019-05-11 | Disposition: A | Discharge status: Home / Self Care | Payer: Medicare Other | Source: Home / Self Care | Attending: Emergency Medicine | Admitting: Emergency Medicine

## 2019-05-11 ENCOUNTER — Telehealth: Payer: Self-pay

## 2019-05-11 ENCOUNTER — Encounter: Payer: Self-pay | Admitting: Family Medicine

## 2019-05-11 ENCOUNTER — Emergency Department (HOSPITAL_COMMUNITY)
Admission: EM | Admit: 2019-05-11 | Discharge: 2019-05-12 | Disposition: A | Discharge status: Home / Self Care | Payer: Medicare Other | Attending: Emergency Medicine | Admitting: Emergency Medicine

## 2019-05-11 ENCOUNTER — Ambulatory Visit: Payer: Self-pay | Admitting: *Deleted

## 2019-05-11 DIAGNOSIS — E119 Type 2 diabetes mellitus without complications: Secondary | ICD-10-CM | POA: Diagnosis not present

## 2019-05-11 DIAGNOSIS — R112 Nausea with vomiting, unspecified: Secondary | ICD-10-CM

## 2019-05-11 DIAGNOSIS — R509 Fever, unspecified: Secondary | ICD-10-CM | POA: Diagnosis not present

## 2019-05-11 DIAGNOSIS — R197 Diarrhea, unspecified: Secondary | ICD-10-CM | POA: Diagnosis not present

## 2019-05-11 DIAGNOSIS — N3001 Acute cystitis with hematuria: Secondary | ICD-10-CM

## 2019-05-11 DIAGNOSIS — Z79899 Other long term (current) drug therapy: Secondary | ICD-10-CM | POA: Insufficient documentation

## 2019-05-11 DIAGNOSIS — Z20828 Contact with and (suspected) exposure to other viral communicable diseases: Secondary | ICD-10-CM | POA: Diagnosis not present

## 2019-05-11 DIAGNOSIS — Z87891 Personal history of nicotine dependence: Secondary | ICD-10-CM | POA: Diagnosis not present

## 2019-05-11 LAB — COMPREHENSIVE METABOLIC PANEL
ALT: 19 U/L (ref 0–44)
AST: 20 U/L (ref 15–41)
Albumin: 4 g/dL (ref 3.5–5.0)
Alkaline Phosphatase: 76 U/L (ref 38–126)
Anion gap: 13 (ref 5–15)
BUN: 38 mg/dL — ABNORMAL HIGH (ref 8–23)
CO2: 26 mmol/L (ref 22–32)
Calcium: 9.5 mg/dL (ref 8.9–10.3)
Chloride: 95 mmol/L — ABNORMAL LOW (ref 98–111)
Creatinine, Ser: 1.9 mg/dL — ABNORMAL HIGH (ref 0.61–1.24)
GFR calc Af Amer: 40 mL/min — ABNORMAL LOW (ref >60)
GFR calc non Af Amer: 35 mL/min — ABNORMAL LOW (ref >60)
Glucose, Bld: 186 mg/dL — ABNORMAL HIGH (ref 70–99)
Potassium: 4.2 mmol/L (ref 3.5–5.1)
Sodium: 134 mmol/L — ABNORMAL LOW (ref 135–145)
Total Bilirubin: 0.6 mg/dL (ref 0.3–1.2)
Total Protein: 8 g/dL (ref 6.5–8.1)

## 2019-05-11 LAB — URINALYSIS, ROUTINE W REFLEX MICROSCOPIC
Bilirubin Urine: NEGATIVE
Glucose, UA: NEGATIVE mg/dL
Ketones, ur: NEGATIVE mg/dL
Nitrite: NEGATIVE
Protein, ur: 100 mg/dL — AB
RBC / HPF: >50 RBC/hpf — ABNORMAL HIGH (ref 0–5)
Specific Gravity, Urine: 1.015 (ref 1.005–1.030)
WBC, UA: >50 WBC/hpf — ABNORMAL HIGH (ref 0–5)
pH: 8 (ref 5.0–8.0)

## 2019-05-11 LAB — LIPASE, BLOOD: Lipase: 21 U/L (ref 11–51)

## 2019-05-11 LAB — CBC
HCT: 33.6 % — ABNORMAL LOW (ref 39.0–52.0)
Hemoglobin: 11 g/dL — ABNORMAL LOW (ref 13.0–17.0)
MCH: 30.5 pg (ref 26.0–34.0)
MCHC: 32.7 g/dL (ref 30.0–36.0)
MCV: 93.1 fL (ref 80.0–100.0)
Platelets: 258 10*3/uL (ref 150–400)
RBC: 3.61 MIL/uL — ABNORMAL LOW (ref 4.22–5.81)
RDW: 13.6 % (ref 11.5–15.5)
WBC: 15.2 10*3/uL — ABNORMAL HIGH (ref 4.0–10.5)
nRBC: 0 % (ref 0.0–0.2)

## 2019-05-11 MED ORDER — SODIUM CHLORIDE 0.9% FLUSH
3.0000 mL | Freq: Once | INTRAVENOUS | Status: AC
  Administered 2019-05-12: 3 mL via INTRAVENOUS

## 2019-05-11 NOTE — Telephone Encounter (Signed)
Wife, Sergio Pennington called in with Sergio Pennington there with him.  He has a fever of 101.8 that was taken an hour ago.   He just now took some Tylenol.   He is very cold and having nausea, poor appetite and vomited the milk that he drank.   He is drinking some water but per Sergio Pennington,   "Not near enough".    I encouraged him to drink water especially with the fever.     He has a history of bladder and prostate cancer that he had removed back in July.  He has stoma that per wife looks healthy and his urine is fine.   Not cloudy, strong smelling.   He also has a Port-A-Cath.    They didn't really want to go to the ED.   I checked with Dr. Lillie Pennington flow coordinator and they could not see him in the office or virtually.     I let pt and wife know he needed to go to the ED so he can get worked up.  They were agreeable to going to an urgent care.   So they are going to [facility].  I let her know at least if he needs to be admitted or have further tests he is there at [facility].    I sent my triage notes to Dr. Lillie Pennington office.      Reason for Disposition . [1] Fever > 100.0 F (37.8 C) AND [2] bedridden (e.g., nursing home patient, CVA, chronic illness, recovering from surgery)  Answer Assessment - Initial Assessment Questions 1. TEMPERATURE: "What is the most recent temperature?"  "How was it measured?"      101.8 fever 1 hour ago. That's the first temp we have taken.  Having nausea and vomiting 2. ONSET: "When did the fever start?"      It's hard to tell because he is cold all the time. 3. SYMPTOMS: "Do you have any other symptoms besides the fever?"  (e.g., colds, headache, sore throat, earache, cough, rash, diarrhea, vomiting, abdominal pain)     Diarrhea.   He takes a lot of Miralax.   No sore throat or coughing.   Still has taste and smell.    No COVID-19 exposures. He gets chemo all spring.   Bladder and prostate removed in July.   He has a stoma.   He is not walking steady,  wants to sleep all the time.   He has lower back pain he blames on our bed.   Stoma looks healthy and so does urine. 4. CAUSE: If there are no symptoms, ask: "What do you think is causing the fever?"      I don't know.     5. CONTACTS: "Does anyone else in the family have an infection?"     We always wear a mask when we go out.    Poor appetite.   We had to stop chemo because he got so depleted.  He acts like that now. 6. TREATMENT: "What have you done so far to treat this fever?" (e.g., medications)     Just took some Tylenol.    7. IMMUNOCOMPROMISE: "Do you have of the following: diabetes, HIV positive, splenectomy, cancer chemotherapy, chronic steroid treatment, transplant patient, etc."     Cancer history  Bladder and prostate removed. 8. PREGNANCY: "Is there any chance you are pregnant?" "When was your last menstrual period?"     N/A 9. TRAVEL: "Have you traveled out of the country in  the last month?" (e.g., travel history, exposures)     No  Protocols used: FEVER-A-AH

## 2019-05-11 NOTE — Telephone Encounter (Signed)
Patient's wife called and left a message at 1:38 pm stating patient has a fever, nausea, and vomiting. Last chemo treatment was April 2020. Patient's wife denies that patient has any pain, shortness of breath or any other symptoms than those reported. Instructed patient's wife to contact PCP and she verbalized understanding.

## 2019-05-11 NOTE — ED Triage Notes (Signed)
Pt reports N, V, D and fatigue that started yesterday.  Today he developed a fever, highest 101.8.  His last dose of Tylenol, 1000mg  was at 1500 and he had compazine for nausea at 1400.

## 2019-05-11 NOTE — Discharge Instructions (Addendum)
Go to the [facility], ER immediately.  We need to get more information about what is causing your symptoms.  Let them know if you start to feel worse, if your abdominal pain changes, or for any other concerns.

## 2019-05-11 NOTE — ED Triage Notes (Addendum)
Pt complaint of abdominal pain with n/v/d for 3 days and new fever (101.2--tylenol taken at 3 PM). Pt has urostomy.

## 2019-05-11 NOTE — Telephone Encounter (Signed)
Per Dr. Lorelei Pont triage advised to let patient know we can ofer him a virtual visit today but he will be better taken care of if he goes to the ER. Per triage note patient will go to [facility]. urgent care fist.

## 2019-05-11 NOTE — ED Provider Notes (Signed)
HPI  SUBJECTIVE:  Sergio Pennington is a 68 y.o. male who presents with fever Tmax 101.8 starting today.  He reports fatigue, nausea, 2 episodes of bloody diarrhea, 2 episodes of nonbilious nonbloody emesis today.  He states that he is unable to tolerate any p.o., but has not tried to eat or drink since he last vomited.  He took 1000 mg of Tylenol several hours prior to evaluation with fever reduction.  He took promethazine with improvement in nausea.  No aggravating factors.  He reports lower abdominal pain, but states that this has been present since his surgery in July.  States it actually got better today.  No body aches, headaches, nasal congestion, sore throat, cough, shortness of breath, loss of sense of smell or taste.  No change in the color of his urine, hematuria, no new odor, cloudy urine.  No sinus pain or pressure, ear pain.  No chest pain, wheezing.  No swelling, erythema around the Port-A-Cath that he has had for a year.  No known exposure to Covid or flu.  Denies abscesses or known skin breakdown.  He did not get a flu shot this year.  No raw or undercooked foods, questionable leftovers, recent travel, contacts with similar symptoms.  He has a past medical history of bladder, prostate cancer status post surgery in July.  He is not on any chemo.  He has a history of diabetes, is on 1 Metformin per day.  History of UTIs, no history of pyelonephritis.  No history of hypertension, pneumonia, pulmonary disease.  He is a former smoker.  PMD: Dr. Edilia Bo.    Past Medical History:  Diagnosis Date  . Bladder cancer ([place])   . Cellulitis    Left leg   . Constipation   . Diabetes mellitus without complication ([place])   . Diabetic neuropathy ([place])    Feet  . Hematuria 04/21/2018  . Hyperlipidemia   . 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 Vasculitis ([place])    Left leg    Past Surgical History:  Procedure Laterality Date  . CYSTOSCOPY WITH  INJECTION N/A 11/26/2018   Procedure: CYSTOSCOPY WITH INJECTION OF INDOCYANINE GREEN DYE;  Surgeon: Alexis Frock, MD;  Location: [facility];  Service: Urology;  Laterality: N/A;  . IR IMAGING GUIDED PORT INSERTION  06/26/2018  . MOUTH SURGERY  1980  . TONSILLECTOMY     4th grade  . TRANSURETHRAL RESECTION OF BLADDER TUMOR N/A 06/16/2018   Procedure: TRANSURETHRAL RESECTION OF BLADDER TUMOR (TURBT);  Surgeon: Lucas Mallow, MD;  Location: [facility];  Service: Urology;  Laterality: N/A;    History reviewed. No pertinent family history.  Social History   Tobacco Use  . Smoking status: Former Research scientist (life sciences)  . Smokeless tobacco: Never Used  Substance Use Topics  . Alcohol use: Not Currently    Alcohol/week: 1.0 standard drinks    Types: 1 Glasses of wine per week  . Drug use: Not Currently    No current facility-administered medications for this encounter.  Current Outpatient Medications:  .  acetaminophen (TYLENOL) 500 MG tablet, Take 1,000 mg by mouth 2 (two) times a day., Disp: , Rfl:  .  Cyanocobalamin (VITAMIN B-12 PO), Take 4,000 mcg by mouth daily. , Disp: , Rfl:  .  Digestive Enzymes (DIGESTIVE ENZYME PO), Take 1 capsule by mouth 2 (two) times daily. , Disp: , Rfl:  .  lidocaine-prilocaine (EMLA) cream, Apply 1 application topically as needed.,  Disp: 30 g, Rfl: 0 .  magnesium oxide (MAG-OX) 400 MG tablet, TAKE 1 TABLET BY MOUTH EVERY DAY, Disp: 30 tablet, Rfl: 0 .  metFORMIN (GLUCOPHAGE) 500 MG tablet, TAKE 1 TABLET BY MOUTH 2 (TWO) TIMES DAILY WITH A MEAL. MAKE OFFICE VISIT FOR REFILLS, Disp: 180 tablet, Rfl: 1 .  Probiotic Product (PROBIOTIC DAILY PO), Take 1 capsule by mouth daily., Disp: , Rfl:  .  Zinc 30 MG TABS, Take 1 tablet by mouth daily. , Disp: , Rfl:  .  Cholecalciferol (VITAMIN D3) 125 MCG (5000 UT) CAPS, Take 5,000 Units by mouth daily., Disp: , Rfl:  .  Emollient (CERAVE) CREA, Apply 1 application topically 2 (two) times daily., Disp: , Rfl:  .  LORazepam (ATIVAN) 0.5 MG  tablet, Place 1 tablet (0.5 mg total) under the tongue every 8 (eight) hours as needed (nausea)., Disp: 30 tablet, Rfl: 0 .  OVER THE COUNTER MEDICATION, Take 240 mLs by mouth daily. Asca Liquid Supplement, Disp: , Rfl:  .  OVER THE COUNTER MEDICATION, 1,000 mg 2 (two) times daily. Graviola, Disp: , Rfl:  .  OVER THE COUNTER MEDICATION, CTFO super fruit chew, Disp: , Rfl:  .  OVER THE COUNTER MEDICATION, Alive Men's 50+ gummy, Disp: , Rfl:  .  OVER THE COUNTER MEDICATION, Elderberry syrup with turmeric, Disp: , Rfl:  .  polyethylene glycol powder (MIRALAX) 17 GM/SCOOP powder, Take 1 Container by mouth daily. , Disp: , Rfl:  .  prochlorperazine (COMPAZINE) 10 MG tablet, TAKE 1 TABLET BY MOUTH EVERY 6 HOURS AS NEEDED FOR NAUSEA OR VOMITING. (Patient taking differently: Take 10 mg by mouth every 6 (six) hours as needed for nausea or vomiting. ), Disp: 30 tablet, Rfl: 0 .  senna-docusate (SENNA S) 8.6-50 MG tablet, Take 1 tablet by mouth 2 (two) times daily., Disp: 90 tablet, Rfl: 3 .  tolnaftate (TINACTIN) 1 % spray, Apply 1 spray topically daily as needed (for feet itching)., Disp: , Rfl:  .  triamcinolone cream (KENALOG) 0.1 %, Apply 1 application topically 2 (two) times daily as needed (for itching)., Disp: 80 g, Rfl: 1 .  UNABLE TO FIND, 1,000 mg 3 (three) times daily. Med Name:AHCC, Disp: , Rfl:   Allergies  Allergen Reactions  . Sulfa Antibiotics Hives     ROS  As noted in HPI.   Physical Exam  BP 123/66 (BP Location: Right Arm)   Pulse 81   Temp 98.3 F (36.8 C) (Oral)   Resp 18   SpO2 98%   Constitutional: Well developed, well nourished, no acute distress Eyes: PERRL, EOMI, conjunctiva normal bilaterally HENT: Normocephalic, atraumatic,mucus membranes moist Respiratory: Clear to auscultation bilaterally, no rales, no wheezing, no rhonchi.  Port-A-Cath right chest intact, no erythema, tenderness. Cardiovascular: Normal rate and rhythm, no murmurs, no gallops, no rubs GI:  Soft, nondistended, normal bowel sounds, lower abdominal tenderness, patient states that this is not new, no rebound, no guarding.  Urostomy stoma appears normal, draining mucus and clear urine. Back: no CVAT skin: No rash, skin intact Musculoskeletal: No edema, no tenderness, no deformities Neurologic: Alert & oriented x 3, CN III-XII grossly intact, no motor deficits, sensation grossly intact Psychiatric: Speech and behavior appropriate   ED Course   Medications - No data to display  No orders of the defined types were placed in this encounter.  No results found for this or any previous visit (from the past 24 hour(s)). No results found.  ED Clinical Impression  1. Fever, unspecified  fever cause   2. Nausea vomiting and diarrhea      ED Assessment/Plan  Differential for fever, nausea and vomiting is vast.  Due to the patient's multiple medical conditions, feel that we need more information to evaluate the source of his fever and other symptoms.  Do not have access to labs here.  Will transfer to the emergency department for comprehensive work-up.  Feel that patient is stable to go by private vehicle.  His vitals are normal.  He is afebrile, but he took an antipyretic within 4 to 6 hours of evaluation.  Discussed rationale for transfer with the patient and spouse to the [facility] emergency department.  They agree to go tonight.  No orders of the defined types were placed in this encounter.   *This clinic note was created using Dragon dictation software. Therefore, there may be occasional mistakes despite careful proofreading.  ?   Melynda Ripple, MD 05/11/19 1806

## 2019-05-12 ENCOUNTER — Emergency Department (HOSPITAL_COMMUNITY): Payer: Medicare Other

## 2019-05-12 DIAGNOSIS — N3001 Acute cystitis with hematuria: Secondary | ICD-10-CM | POA: Diagnosis not present

## 2019-05-12 MED ORDER — SODIUM CHLORIDE 0.9 % IV BOLUS
1000.0000 mL | Freq: Once | INTRAVENOUS | Status: AC
  Administered 2019-05-12: 02:00:00 1000 mL via INTRAVENOUS

## 2019-05-12 MED ORDER — CEPHALEXIN 500 MG PO CAPS: 500.0000 mg | ORAL_CAPSULE | Freq: Four times a day (QID) | ORAL | 0 refills | Status: DC

## 2019-05-12 MED ORDER — ONDANSETRON 4 MG PO TBDP: ORAL_TABLET | ORAL | 0 refills | Status: AC

## 2019-05-12 NOTE — ED Provider Notes (Addendum)
[facility] DEPT Provider Note   CSN: [id number] Arrival date & time: 05/11/19  4818     History Chief Complaint  Patient presents with  . Abdominal Pain    Sergio Pennington is a 68 y.o. male.  68 yo M with a chief complaint of a fever.  Going on since yesterday.  Patient has taken some Tylenol with some improvement.  Has had some nausea vomiting and diarrhea with this as well.  Denies cough.  Denies sick contacts.  Patient has been having abdominal pain mostly at night for the past 6 months ever since he had his urostomy procedure performed.  Feels this is not significantly changed from baseline.  The history is provided by the patient.  Abdominal Pain Associated symptoms: diarrhea, fever, nausea and vomiting   Associated symptoms: no chest pain, no chills and no shortness of breath   Illness Severity:  Moderate Onset quality:  Gradual Duration:  2 days Timing:  Constant Progression:  Worsening Chronicity:  New Associated symptoms: abdominal pain, diarrhea, fever, nausea and vomiting   Associated symptoms: no chest pain, no congestion, no headaches, no myalgias, no rash and no shortness of breath        Past Medical History:  Diagnosis Date  . Bladder cancer ([place])   . Cellulitis    Left leg   . Constipation   . Diabetes mellitus without complication ([place])   . Diabetic neuropathy ([place])    Feet  . Hematuria 04/21/2018  . Hyperlipidemia   . 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 Vasculitis ([place])    Left leg    Patient Active Problem List   Diagnosis Date Noted  . Port-A-Cath in place 07/03/2018  . Bladder cancer ([place]) 06/17/2018  . DIAB W/O MENTION COMP TYPE II/UNS TYPE UNCNTRL 03/08/2008  . DIABETES MELLITUS, TYPE II, UNCONTROLLED, W/NEUROLO COMPS 03/08/2008  . HYPERLIPIDEMIA 03/08/2008  . TOBACCO ABUSE 03/08/2008    Past Surgical History:  Procedure Laterality Date  .  CYSTOSCOPY WITH INJECTION N/A 11/26/2018   Procedure: CYSTOSCOPY WITH INJECTION OF INDOCYANINE GREEN DYE;  Surgeon: Alexis Frock, MD;  [facility];  Service: Urology;  Laterality: N/A;  . IR IMAGING GUIDED PORT INSERTION  06/26/2018  . MOUTH SURGERY  1980  . TONSILLECTOMY     4th grade  . TRANSURETHRAL RESECTION OF BLADDER TUMOR N/A 06/16/2018   Procedure: TRANSURETHRAL RESECTION OF BLADDER TUMOR (TURBT);  Surgeon: Lucas Mallow, MD;  Location: [facility];  Service: Urology;  Laterality: N/A;       No family history on file.  Social History   Tobacco Use  . Smoking status: Former Research scientist (life sciences)  . Smokeless tobacco: Never Used  Substance Use Topics  . Alcohol use: Not Currently    Alcohol/week: 1.0 standard drinks    Types: 1 Glasses of wine per week  . Drug use: Not Currently    Home Medications Prior to Admission medications   Medication Sig Start Date End Date Taking? Authorizing Provider  acetaminophen (TYLENOL) 500 MG tablet Take 1,000 mg by mouth 2 (two) times a day.    [provider]  cephALEXin (KEFLEX) 500 MG capsule Take 1 capsule (500 mg total) by mouth 4 (four) times daily. 05/12/19   Deno Etienne, DO  Cholecalciferol (VITAMIN D3) 125 MCG (5000 UT) CAPS Take 5,000 Units by mouth daily.    [provider]  Cyanocobalamin (VITAMIN B-12 PO) Take 4,000 mcg by  mouth daily.     [provider]  Digestive Enzymes (DIGESTIVE ENZYME PO) Take 1 capsule by mouth 2 (two) times daily.     [provider]  Emollient (CERAVE) CREA Apply 1 application topically 2 (two) times daily.    [provider]  lidocaine-prilocaine (EMLA) cream Apply 1 application topically as needed. 06/17/18   Wyatt Portela, MD  LORazepam (ATIVAN) 0.5 MG tablet Place 1 tablet (0.5 mg total) under the tongue every 8 (eight) hours as needed (nausea). 09/26/18   Tanner, Lucianne Lei E., PA-C  magnesium oxide (MAG-OX) 400 MG tablet TAKE 1 TABLET BY MOUTH EVERY DAY 01/06/19   Wyatt Portela, MD  metFORMIN (GLUCOPHAGE) 500 MG tablet TAKE 1 TABLET BY MOUTH 2 (TWO) TIMES DAILY WITH A MEAL. MAKE OFFICE VISIT FOR REFILLS 12/15/18   [name], [name], MD  ondansetron (ZOFRAN ODT) 4 MG disintegrating tablet 4mg  ODT q4 hours prn nausea/vomit 05/12/19   Deno Etienne, DO  OVER THE COUNTER MEDICATION Take 240 mLs by mouth daily. Asca Liquid Supplement    [provider]  OVER THE COUNTER MEDICATION 1,000 mg 2 (two) times daily. Graviola    [provider]  OVER THE COUNTER MEDICATION CTFO super fruit chew    [provider]  OVER THE COUNTER MEDICATION Alive Men's 50+ gummy    [provider]  OVER THE COUNTER MEDICATION Elderberry syrup with turmeric    [provider]  polyethylene glycol powder (MIRALAX) 17 GM/SCOOP powder Take 1 Container by mouth daily.     [provider]  Probiotic Product (PROBIOTIC DAILY PO) Take 1 capsule by mouth daily.    [provider]  prochlorperazine (COMPAZINE) 10 MG tablet TAKE 1 TABLET BY MOUTH EVERY 6 HOURS AS NEEDED FOR NAUSEA OR VOMITING. Patient taking differently: Take 10 mg by mouth every 6 (six) hours as needed for nausea or vomiting.  11/04/18   Wyatt Portela, MD  senna-docusate (SENNA S) 8.6-50 MG tablet Take 1 tablet by mouth 2 (two) times daily. 10/08/18   Wyatt Portela, MD  tolnaftate (TINACTIN) 1 % spray Apply 1 spray topically daily as needed (for feet itching).    [provider]  triamcinolone cream (KENALOG) 0.1 % Apply 1 application topically 2 (two) times daily as needed (for itching). 03/12/19   [name], [name], MD  UNABLE TO FIND 1,000 mg 3 (three) times daily. Med Name:AHCC    [provider]  Zinc 30 MG TABS Take 1 tablet by mouth daily.     [provider]    Allergies    Sulfa antibiotics  Review of Systems   Review of Systems  Constitutional: Positive for fever. Negative for chills.  HENT: Negative for congestion and facial  swelling.   Eyes: Negative for discharge and visual disturbance.  Respiratory: Negative for shortness of breath.   Cardiovascular: Negative for chest pain and palpitations.  Gastrointestinal: Positive for abdominal pain, diarrhea, nausea and vomiting.  Musculoskeletal: Negative for arthralgias and myalgias.  Skin: Negative for color change and rash.  Neurological: Negative for tremors, syncope and headaches.  Psychiatric/Behavioral: Negative for confusion and dysphoric mood.    Physical Exam Updated Vital Signs BP 137/69 (BP Location: Left Arm)   Pulse 80   Temp 99.4 F (37.4 C) (Oral)   Resp 15   Ht 6' (1.829 m)   Wt 76.7 kg   SpO2 97%   BMI 22.92 kg/m   Physical Exam Vitals and nursing note  reviewed.  Constitutional:      Appearance: He is well-developed.  HENT:     Head: Normocephalic and atraumatic.  Eyes:     Pupils: Pupils are equal, round, and reactive to light.  Neck:     Vascular: No JVD.  Cardiovascular:     Rate and Rhythm: Normal rate and regular rhythm.     Heart sounds: No murmur. No friction rub. No gallop.   Pulmonary:     Effort: No respiratory distress.     Breath sounds: No wheezing.  Abdominal:     General: There is no distension.     Tenderness: There is no abdominal tenderness. There is no guarding or rebound.     Comments: Urostomy tube in place.  Amber-colored urine.  Musculoskeletal:        General: Normal range of motion.     Cervical back: Normal range of motion and neck supple.  Skin:    Coloration: Skin is not pale.     Findings: No rash.  Neurological:     Mental Status: He is alert and oriented to person, place, and time.  Psychiatric:        Behavior: Behavior normal.     ED Results / Procedures / Treatments   Labs (all labs ordered are listed, but only abnormal results are displayed) Labs Reviewed  COMPREHENSIVE METABOLIC PANEL - Abnormal; Notable for the following components:      Result Value   Sodium 134 (*)     Chloride 95 (*)    Glucose, Bld 186 (*)    BUN 38 (*)    Creatinine, Ser 1.90 (*)    GFR calc non Af Amer 35 (*)    GFR calc Af Amer 40 (*)    All other components within normal limits  CBC - Abnormal; Notable for the following components:   WBC 15.2 (*)    RBC 3.61 (*)    Hemoglobin 11.0 (*)    HCT 33.6 (*)    All other components within normal limits  URINALYSIS, ROUTINE W REFLEX MICROSCOPIC - Abnormal; Notable for the following components:   Color, Urine YELLOW (*)    APPearance TURBID (*)    Hgb urine dipstick SMALL (*)    Protein, ur 100 (*)    Leukocytes,Ua LARGE (*)    RBC / HPF >50 (*)    WBC, UA >50 (*)    Bacteria, UA RARE (*)    All other components within normal limits  CULTURE, BLOOD (ROUTINE X 2)  CULTURE, BLOOD (ROUTINE X 2)  URINE CULTURE  NOVEL CORONAVIRUS, NAA (HOSP ORDER, SEND-OUT TO REF LAB; TAT 18-24 HRS)  LIPASE, BLOOD    EKG None  Radiology CT ABDOMEN PELVIS WO CONTRAST  Result Date: 05/12/2019 CLINICAL DATA:  Abdominal pain. Fever. Postop. History of bladder cancer. History of prostate cancer and prior TURP dated 06/16/2018. Bladder and prostate gland are eventually removed. The patient has a urostomy as of 12/15/2018 EXAM: CT ABDOMEN AND PELVIS WITHOUT CONTRAST TECHNIQUE: Multidetector CT imaging of the abdomen and pelvis was performed following the standard protocol without IV contrast. COMPARISON:  CT of the pelvis dated Oct 23, 2018 FINDINGS: Lower chest: The lung bases are clear. The heart size is normal. Hepatobiliary: There is a 2 cm hypoattenuating area in the left hepatic lobe. This is favored to represent focal fatty infiltration and was seen on the prior CT. Normal gallbladder.There is no biliary ductal dilation. Pancreas: Normal contours without ductal dilatation. No peripancreatic  fluid collection. Spleen: No splenic laceration or hematoma. Adrenals/Urinary Tract: --Adrenal glands: No adrenal hemorrhage. --Right kidney/ureter: There is mild  right-sided hydroureteronephrosis the level of the patient's urostomy. There is a complex cystic lesion involving the lower pole the right kidney which is relatively similar to prior CTs. --Left kidney/ureter: There is mild left-sided hydroureteronephrosis to the level the patient's urostomy. --Urinary bladder: The bladder surgically absent. Stomach/Bowel: --Stomach/Duodenum: No hiatal hernia or other gastric abnormality. Normal duodenal course and caliber. --Small bowel: No dilatation or inflammation. --Colon: There is a large amount of stool throughout the colon. There is no CT evidence for diverticulitis. There is narrowing at the hepatic flexure that is likely secondary to underdistention. Scattered colonic diverticular noted --Appendix: Normal. Vascular/Lymphatic: Atherosclerotic calcification is present within the non-aneurysmal abdominal aorta, without hemodynamically significant stenosis. --there are multiple new pathologically enlarged retroperitoneal lymph nodes. For example there is a 1.5 cm left periaortic lymph node (axial series 2, image 43). --there are no pathologically enlarged mesenteric lymph nodes. There is a amorphous soft tissue mass in the left lower quadrant measuring approximately 2.8 x 2.3 cm. --will pathologically enlarged pelvic lymph nodes. For example there is a left pelvic sidewall lymph node measuring 1.7 cm in the short axis (axial series 2, image 78). There is an amorphous soft tissue mass in the patient's pelvis measuring approximately 6.6 by 2.3 cm. Reproductive: The prostate gland is surgically absent. Other: No ascites or free air. A right lower quadrant urostomy is noted without evidence for obstruction. Musculoskeletal. There is a question subacute fracture through the S3 vertebral body. IMPRESSION: 1. No acute abnormality. 2. Extensive new pathologically enlarged retroperitoneal and pelvic lymph nodes concerning for development of extensive nodal metastatic disease. 3. There  is a question of subacute fracture through the S3 vertebral body. 4. Mild bilateral hydronephrosis to the level of the patient's urostomy. Aortic Atherosclerosis (ICD10-I70.0). Electronically Signed   By: Constance Holster M.D.   On: 05/12/2019 01:36   DG Chest Port 1 View  Result Date: 05/12/2019 CLINICAL DATA:  Fever and weakness. EXAM: PORTABLE CHEST 1 VIEW COMPARISON:  Chest and left rib series 10/23/2018 FINDINGS: Right chest port remains in place. Unchanged heart size and mediastinal contours. Mild bibasilar scarring. No confluent airspace disease. No pulmonary edema, large pleural effusion or pneumothorax. No acute osseous abnormalities are seen. IMPRESSION: No acute abnormality.  Mild bibasilar scarring. Electronically Signed   By: Keith Rake M.D.   On: 05/12/2019 00:50    Procedures Procedures (including critical care time)  Medications Ordered in ED Medications  sodium chloride flush (NS) 0.9 % injection 3 mL (3 mLs Intravenous Given 05/12/19 0211)  sodium chloride 0.9 % bolus 1,000 mL (1,000 mLs Intravenous New Bag/Given 05/12/19 0211)    ED Course  I have reviewed the triage vital signs and the nursing notes.  Pertinent labs & imaging results that were available during my care of the patient were reviewed by me and considered in my medical decision making (see chart for details).    MDM Rules/Calculators/A&P                      68 yo M with a chief complaints of fever nausea vomiting and diarrhea.  Going on for 24 to 48 hours.  Work-up here with a possible urinary tract infection with a large amounts of white cells and leukocyte esterase.  CT scan of the abdomen pelvis without obvious source of the fever though the patient's cancer burden seems  to have increased somewhat.  There is a L-spine fracture, patient without tenderness overlying, states he broke his back many years ago.  Chest x-ray without pneumonia.  As the patient's fever is occurring during the novel  coronavirus pandemic will obtain a Covid test.  We will treat for possible UTI.  He has mild kidney injury we will give a bolus of IV fluids.  Oncology and urology follow-up.  2:19 AM:  I have discussed the diagnosis/risks/treatment options with the patient and believe the pt to be eligible for discharge home to follow-up with Urology. We also discussed returning to the ED immediately if new or worsening sx occur. We discussed the sx which are most concerning (e.g., sudden worsening pain,  inability to tolerate by mouth) that necessitate immediate return. Medications administered to the patient during their visit and any new prescriptions provided to the patient are listed below.  Medications given during this visit Medications  sodium chloride flush (NS) 0.9 % injection 3 mL (3 mLs Intravenous Given 05/12/19 0211)  sodium chloride 0.9 % bolus 1,000 mL (1,000 mLs Intravenous New Bag/Given 05/12/19 0211)     The patient appears reasonably screen and/or stabilized for discharge and I doubt any other medical condition or other [facility] requiring further screening, evaluation, or treatment in the ED at this time prior to discharge. '  Final Clinical Impression(s) / ED Diagnoses Final diagnoses:  Acute cystitis with hematuria  Nausea vomiting and diarrhea    Rx / DC Orders ED Discharge Orders         Ordered    ondansetron (ZOFRAN ODT) 4 MG disintegrating tablet     05/12/19 0146    cephALEXin (KEFLEX) 500 MG capsule  4 times daily     05/12/19 0146           Deno Etienne, DO 05/12/19 0220    Deno Etienne, DO 05/12/19 0221

## 2019-05-12 NOTE — Discharge Instructions (Addendum)
Eat and drink as well as your can.  Return for worsening symptoms.   Take tylenol 2 pills 4 times a day and motrin 4 pills 3 times a day.  Drink plenty of fluids.

## 2019-05-13 LAB — NOVEL CORONAVIRUS, NAA (HOSP ORDER, SEND-OUT TO REF LAB; TAT 18-24 HRS): SARS-CoV-2, NAA: NOT DETECTED

## 2019-05-14 LAB — URINE CULTURE: Culture: >=100000 — AB

## 2019-05-15 ENCOUNTER — Telehealth: Payer: Self-pay | Admitting: *Deleted

## 2019-05-15 NOTE — Telephone Encounter (Signed)
Post ED Visit - Positive Culture Follow-up  Culture report reviewed by antimicrobial stewardship pharmacist: [place] Team []  Elenor Quinones, Pharm.D. []  Heide Guile, Pharm.D., BCPS AQ-ID []  Parks Neptune, Pharm.D., BCPS []  Alycia Rossetti, Pharm.D., BCPS []  [place], Pharm.D., BCPS, AAHIVP []  Legrand Como, Pharm.D., BCPS, AAHIVP []  Salome Arnt, PharmD, BCPS []  Johnnette Gourd, PharmD, BCPS []  Hughes Better, PharmD, BCPS []  Leeroy Cha, PharmD []  Laqueta Linden, PharmD, BCPS []  Albertina Parr, PharmD  [place] Team []  Leodis Sias, PharmD []  Lindell Spar, PharmD [x]  Royetta Asal, PharmD []  Graylin Shiver, Rph []  Rema Fendt) Glennon [name], PharmD []  Arlyn Dunning, PharmD []  Netta Cedars, PharmD []  Dia Sitter, PharmD []  Leone Haven, PharmD []  Gretta Arab, PharmD []  Theodis Shove, PharmD []  Peggyann Juba, PharmD []  Reuel Boom, PharmD   Positive urine culture Treated with Cephalexin, organism sensitive to the same and no further patient follow-up is required at this time.  Harlon Flor [facility] 05/15/2019, 2:17 PM

## 2019-05-17 LAB — CULTURE, BLOOD (ROUTINE X 2)
Culture: NO GROWTH
Special Requests: ADEQUATE

## 2019-05-27 ENCOUNTER — Telehealth: Payer: Self-pay

## 2019-05-27 ENCOUNTER — Inpatient Hospital Stay (HOSPITAL_COMMUNITY): Payer: Medicare Other

## 2019-05-27 ENCOUNTER — Other Ambulatory Visit: Payer: Self-pay

## 2019-05-27 ENCOUNTER — Inpatient Hospital Stay (HOSPITAL_COMMUNITY)
Admission: EM | Admit: 2019-05-27 | Discharge: 2019-06-05 | DRG: 178 | Disposition: A | Discharge status: Home / Self Care | Payer: Medicare Other | Attending: Internal Medicine | Admitting: Internal Medicine

## 2019-05-27 ENCOUNTER — Encounter (HOSPITAL_COMMUNITY): Payer: Self-pay

## 2019-05-27 ENCOUNTER — Emergency Department (HOSPITAL_COMMUNITY): Payer: Medicare Other

## 2019-05-27 DIAGNOSIS — I129 Hypertensive chronic kidney disease with stage 1 through stage 4 chronic kidney disease, or unspecified chronic kidney disease: Secondary | ICD-10-CM | POA: Diagnosis present

## 2019-05-27 DIAGNOSIS — E860 Dehydration: Secondary | ICD-10-CM | POA: Diagnosis present

## 2019-05-27 DIAGNOSIS — E1140 Type 2 diabetes mellitus with diabetic neuropathy, unspecified: Secondary | ICD-10-CM | POA: Diagnosis present

## 2019-05-27 DIAGNOSIS — N179 Acute kidney failure, unspecified: Secondary | ICD-10-CM | POA: Diagnosis present

## 2019-05-27 DIAGNOSIS — N136 Pyonephrosis: Secondary | ICD-10-CM | POA: Diagnosis present

## 2019-05-27 DIAGNOSIS — E872 Acidosis: Secondary | ICD-10-CM | POA: Diagnosis present

## 2019-05-27 DIAGNOSIS — I82462 Acute embolism and thrombosis of left calf muscular vein: Secondary | ICD-10-CM | POA: Diagnosis present

## 2019-05-27 DIAGNOSIS — B961 Klebsiella pneumoniae [K. pneumoniae] as the cause of diseases classified elsewhere: Secondary | ICD-10-CM | POA: Diagnosis present

## 2019-05-27 DIAGNOSIS — E785 Hyperlipidemia, unspecified: Secondary | ICD-10-CM | POA: Diagnosis present

## 2019-05-27 DIAGNOSIS — A0839 Other viral enteritis: Secondary | ICD-10-CM | POA: Diagnosis present

## 2019-05-27 DIAGNOSIS — N183 Chronic kidney disease, stage 3 unspecified: Secondary | ICD-10-CM | POA: Diagnosis present

## 2019-05-27 DIAGNOSIS — U071 COVID-19: Principal | ICD-10-CM

## 2019-05-27 DIAGNOSIS — Z8551 Personal history of malignant neoplasm of bladder: Secondary | ICD-10-CM

## 2019-05-27 DIAGNOSIS — I82413 Acute embolism and thrombosis of femoral vein, bilateral: Secondary | ICD-10-CM | POA: Diagnosis present

## 2019-05-27 DIAGNOSIS — D638 Anemia in other chronic diseases classified elsewhere: Secondary | ICD-10-CM | POA: Diagnosis present

## 2019-05-27 DIAGNOSIS — E871 Hypo-osmolality and hyponatremia: Secondary | ICD-10-CM | POA: Diagnosis present

## 2019-05-27 DIAGNOSIS — E1122 Type 2 diabetes mellitus with diabetic chronic kidney disease: Secondary | ICD-10-CM | POA: Diagnosis present

## 2019-05-27 DIAGNOSIS — D649 Anemia, unspecified: Secondary | ICD-10-CM

## 2019-05-27 DIAGNOSIS — N1832 Chronic kidney disease, stage 3b: Secondary | ICD-10-CM

## 2019-05-27 DIAGNOSIS — Z906 Acquired absence of other parts of urinary tract: Secondary | ICD-10-CM

## 2019-05-27 DIAGNOSIS — N390 Urinary tract infection, site not specified: Secondary | ICD-10-CM | POA: Diagnosis present

## 2019-05-27 DIAGNOSIS — K5900 Constipation, unspecified: Secondary | ICD-10-CM | POA: Diagnosis present

## 2019-05-27 DIAGNOSIS — Z87440 Personal history of urinary (tract) infections: Secondary | ICD-10-CM

## 2019-05-27 DIAGNOSIS — I4510 Unspecified right bundle-branch block: Secondary | ICD-10-CM | POA: Diagnosis present

## 2019-05-27 DIAGNOSIS — Z87891 Personal history of nicotine dependence: Secondary | ICD-10-CM

## 2019-05-27 DIAGNOSIS — Z932 Ileostomy status: Secondary | ICD-10-CM

## 2019-05-27 DIAGNOSIS — Z7984 Long term (current) use of oral hypoglycemic drugs: Secondary | ICD-10-CM

## 2019-05-27 DIAGNOSIS — R791 Abnormal coagulation profile: Secondary | ICD-10-CM | POA: Diagnosis not present

## 2019-05-27 DIAGNOSIS — Z79899 Other long term (current) drug therapy: Secondary | ICD-10-CM

## 2019-05-27 DIAGNOSIS — R109 Unspecified abdominal pain: Secondary | ICD-10-CM

## 2019-05-27 DIAGNOSIS — R319 Hematuria, unspecified: Secondary | ICD-10-CM | POA: Diagnosis not present

## 2019-05-27 DIAGNOSIS — R188 Other ascites: Secondary | ICD-10-CM | POA: Diagnosis present

## 2019-05-27 DIAGNOSIS — R5381 Other malaise: Secondary | ICD-10-CM | POA: Diagnosis present

## 2019-05-27 DIAGNOSIS — Z9221 Personal history of antineoplastic chemotherapy: Secondary | ICD-10-CM

## 2019-05-27 DIAGNOSIS — R1110 Vomiting, unspecified: Secondary | ICD-10-CM

## 2019-05-27 DIAGNOSIS — J9811 Atelectasis: Secondary | ICD-10-CM | POA: Diagnosis present

## 2019-05-27 DIAGNOSIS — I82432 Acute embolism and thrombosis of left popliteal vein: Secondary | ICD-10-CM | POA: Diagnosis present

## 2019-05-27 DIAGNOSIS — I82442 Acute embolism and thrombosis of left tibial vein: Secondary | ICD-10-CM | POA: Diagnosis present

## 2019-05-27 DIAGNOSIS — Z882 Allergy status to sulfonamides status: Secondary | ICD-10-CM

## 2019-05-27 DIAGNOSIS — R197 Diarrhea, unspecified: Secondary | ICD-10-CM

## 2019-05-27 DIAGNOSIS — R531 Weakness: Secondary | ICD-10-CM

## 2019-05-27 DIAGNOSIS — R112 Nausea with vomiting, unspecified: Secondary | ICD-10-CM

## 2019-05-27 LAB — COMPREHENSIVE METABOLIC PANEL
ALT: 22 U/L (ref 0–44)
AST: 19 U/L (ref 15–41)
Albumin: 2.8 g/dL — ABNORMAL LOW (ref 3.5–5.0)
Alkaline Phosphatase: 72 U/L (ref 38–126)
Anion gap: 13 (ref 5–15)
BUN: 67 mg/dL — ABNORMAL HIGH (ref 8–23)
CO2: 23 mmol/L (ref 22–32)
Calcium: 8.8 mg/dL — ABNORMAL LOW (ref 8.9–10.3)
Chloride: 96 mmol/L — ABNORMAL LOW (ref 98–111)
Creatinine, Ser: 3.15 mg/dL — ABNORMAL HIGH (ref 0.61–1.24)
GFR calc Af Amer: 22 mL/min — ABNORMAL LOW (ref >60)
GFR calc non Af Amer: 19 mL/min — ABNORMAL LOW (ref >60)
Glucose, Bld: 151 mg/dL — ABNORMAL HIGH (ref 70–99)
Potassium: 5.1 mmol/L (ref 3.5–5.1)
Sodium: 132 mmol/L — ABNORMAL LOW (ref 135–145)
Total Bilirubin: 0.5 mg/dL (ref 0.3–1.2)
Total Protein: 6.4 g/dL — ABNORMAL LOW (ref 6.5–8.1)

## 2019-05-27 LAB — POC SARS CORONAVIRUS 2 AG -  ED: SARS Coronavirus 2 Ag: POSITIVE — AB

## 2019-05-27 LAB — ABO/RH: ABO/RH(D): O POS

## 2019-05-27 LAB — OCCULT BLOOD X 1 CARD TO LAB, STOOL: Fecal Occult Bld: NEGATIVE

## 2019-05-27 LAB — URINALYSIS, ROUTINE W REFLEX MICROSCOPIC
Bilirubin Urine: NEGATIVE
Glucose, UA: NEGATIVE mg/dL
Ketones, ur: NEGATIVE mg/dL
Nitrite: NEGATIVE
Protein, ur: 100 mg/dL — AB
Specific Gravity, Urine: 1.013 (ref 1.005–1.030)
WBC, UA: >50 WBC/hpf — ABNORMAL HIGH (ref 0–5)
pH: 7 (ref 5.0–8.0)

## 2019-05-27 LAB — CBC
HCT: 27.8 % — ABNORMAL LOW (ref 39.0–52.0)
Hemoglobin: 8.9 g/dL — ABNORMAL LOW (ref 13.0–17.0)
MCH: 29.6 pg (ref 26.0–34.0)
MCHC: 32 g/dL (ref 30.0–36.0)
MCV: 92.4 fL (ref 80.0–100.0)
Platelets: 264 10*3/uL (ref 150–400)
RBC: 3.01 MIL/uL — ABNORMAL LOW (ref 4.22–5.81)
RDW: 13.6 % (ref 11.5–15.5)
WBC: 18.3 10*3/uL — ABNORMAL HIGH (ref 4.0–10.5)
nRBC: 0 % (ref 0.0–0.2)

## 2019-05-27 LAB — FERRITIN: Ferritin: 586 ng/mL — ABNORMAL HIGH (ref 24–336)

## 2019-05-27 LAB — FIBRINOGEN: Fibrinogen: 578 mg/dL — ABNORMAL HIGH (ref 210–475)

## 2019-05-27 LAB — PROCALCITONIN: Procalcitonin: 0.96 ng/mL

## 2019-05-27 LAB — C-REACTIVE PROTEIN: CRP: 20.3 mg/dL — ABNORMAL HIGH (ref <1.0)

## 2019-05-27 LAB — D-DIMER, QUANTITATIVE: D-Dimer, Quant: >20 ug/mL-FEU — ABNORMAL HIGH (ref 0.00–0.50)

## 2019-05-27 LAB — LACTATE DEHYDROGENASE: LDH: 150 U/L (ref 98–192)

## 2019-05-27 LAB — C DIFFICILE QUICK SCREEN W PCR REFLEX
C Diff antigen: NEGATIVE
C Diff interpretation: NOT DETECTED
C Diff toxin: NEGATIVE

## 2019-05-27 LAB — LACTIC ACID, PLASMA: Lactic Acid, Venous: 0.9 mmol/L (ref 0.5–1.9)

## 2019-05-27 MED ORDER — SODIUM CHLORIDE 0.9 % IV BOLUS
1000.0000 mL | Freq: Once | INTRAVENOUS | Status: AC
  Administered 2019-05-27: 21:00:00 1000 mL via INTRAVENOUS

## 2019-05-27 MED ORDER — INSULIN ASPART 100 UNIT/ML ~~LOC~~ SOLN
0.0000 [IU] | Freq: Three times a day (TID) | SUBCUTANEOUS | Status: DC
  Administered 2019-05-28: 09:00:00 1 [IU] via SUBCUTANEOUS
  Administered 2019-06-01: 12:00:00 2 [IU] via SUBCUTANEOUS
  Administered 2019-06-02: 1 [IU] via SUBCUTANEOUS
  Administered 2019-06-03 – 2019-06-04 (×2): 2 [IU] via SUBCUTANEOUS
  Administered 2019-06-04 – 2019-06-05 (×3): 1 [IU] via SUBCUTANEOUS
  Administered 2019-06-05: 2 [IU] via SUBCUTANEOUS
  Filled 2019-05-27: qty 0.09

## 2019-05-27 MED ORDER — ZINC SULFATE 220 (50 ZN) MG PO CAPS
220.0000 mg | ORAL_CAPSULE | Freq: Every day | ORAL | Status: DC
  Administered 2019-05-27 – 2019-06-05 (×10): 220 mg via ORAL
  Filled 2019-05-27 (×10): qty 1

## 2019-05-27 MED ORDER — VITAMIN D 25 MCG (1000 UNIT) PO TABS
2000.0000 [IU] | ORAL_TABLET | Freq: Every day | ORAL | Status: DC
  Administered 2019-05-27 – 2019-06-05 (×10): 2000 [IU] via ORAL
  Filled 2019-05-27 (×11): qty 2

## 2019-05-27 MED ORDER — ONDANSETRON HCL 4 MG/2ML IJ SOLN
INTRAMUSCULAR | Status: AC
  Administered 2019-05-27: 4 mg
  Filled 2019-05-27: qty 2

## 2019-05-27 MED ORDER — PROMETHAZINE HCL 25 MG/ML IJ SOLN
12.5000 mg | Freq: Four times a day (QID) | INTRAMUSCULAR | Status: DC | PRN
  Administered 2019-05-28 – 2019-06-01 (×2): 12.5 mg via INTRAVENOUS
  Filled 2019-05-27 (×2): qty 1

## 2019-05-27 MED ORDER — SODIUM CHLORIDE 0.9 % IV SOLN: INTRAVENOUS | Status: DC

## 2019-05-27 MED ORDER — ACETAMINOPHEN 325 MG PO TABS
650.0000 mg | ORAL_TABLET | Freq: Four times a day (QID) | ORAL | Status: DC | PRN
  Administered 2019-05-27 – 2019-06-03 (×20): 650 mg via ORAL
  Filled 2019-05-27 (×19): qty 2

## 2019-05-27 MED ORDER — ONDANSETRON HCL 4 MG/2ML IJ SOLN
4.0000 mg | Freq: Four times a day (QID) | INTRAMUSCULAR | Status: DC | PRN
  Administered 2019-06-02 – 2019-06-05 (×5): 4 mg via INTRAVENOUS
  Filled 2019-05-27 (×5): qty 2

## 2019-05-27 MED ORDER — HYDRALAZINE HCL 20 MG/ML IJ SOLN
5.0000 mg | INTRAMUSCULAR | Status: DC | PRN
  Administered 2019-05-30 – 2019-06-04 (×2): 5 mg via INTRAVENOUS
  Filled 2019-05-27 (×2): qty 1

## 2019-05-27 MED ORDER — SODIUM CHLORIDE 0.9 % IV BOLUS
500.0000 mL | Freq: Once | INTRAVENOUS | Status: AC
  Administered 2019-05-27: 20:00:00 500 mL via INTRAVENOUS

## 2019-05-27 MED ORDER — SODIUM CHLORIDE 0.9 % IV SOLN
1.0000 g | Freq: Every day | INTRAVENOUS | Status: DC
  Administered 2019-05-27 – 2019-06-01 (×6): 1 g via INTRAVENOUS
  Filled 2019-05-27 (×3): qty 10
  Filled 2019-05-27 (×4): qty 1

## 2019-05-27 MED ORDER — ASCORBIC ACID 500 MG PO TABS
500.0000 mg | ORAL_TABLET | Freq: Two times a day (BID) | ORAL | Status: DC
  Administered 2019-05-27 – 2019-06-05 (×18): 500 mg via ORAL
  Filled 2019-05-27 (×18): qty 1

## 2019-05-27 MED ORDER — GUAIFENESIN-DM 100-10 MG/5ML PO SYRP: 5.0000 mL | ORAL_SOLUTION | ORAL | Status: DC | PRN

## 2019-05-27 MED ORDER — ENOXAPARIN SODIUM 30 MG/0.3ML ~~LOC~~ SOLN
30.0000 mg | Freq: Every day | SUBCUTANEOUS | Status: DC
  Administered 2019-05-27: 22:00:00 30 mg via SUBCUTANEOUS
  Filled 2019-05-27: qty 0.3

## 2019-05-27 MED ORDER — INSULIN ASPART 100 UNIT/ML ~~LOC~~ SOLN
0.0000 [IU] | Freq: Every day | SUBCUTANEOUS | Status: DC
  Administered 2019-05-28 – 2019-06-04 (×2): 2 [IU] via SUBCUTANEOUS
  Filled 2019-05-27: qty 0.05

## 2019-05-27 NOTE — H&P (Addendum)
History and Physical    Sergio Pennington FGH:[id number] DOB: [birth year] DOA: 05/27/2019  PCP: Darreld Mclean, MD Patient coming from: Home  Chief Complaint: Generalized weakness  HPI: Sergio Pennington is a 68 y.o. male with medical history significant of bladder cancer status post urostomy, type 2 diabetes, hypertension, hyperlipidemia presenting to the ED for evaluation of generalized weakness.  Symptoms started 2 weeks ago.  He has had generalized weakness and fatigue.  Intermittent episodes of nonbloody emesis and nonbloody diarrhea.  He is having 3-5/10 intensity generalized abdominal pain and intermittent fevers at home.  He is coughing a little.  Denies shortness of breath or chest pain.  No additional history could be obtained from the patient as he started vomiting.  ED Course: Afebrile and hemodynamically stable.  Oxygen saturation 97-100% on room air.  SARS-CoV-2 rapid antigen test positive.  WBC count 18.3.  Lactic acid normal.  Hemoglobin 8.9, ranging between 10.4-11 on recent labs.  Sodium 133.  BUN 67, creatinine 3.1.  Creatinine ranging between 1.6-1.9 on recent labs.  LFTs normal.  UA with large amount of leukocytes, 21-50 RBCs, greater than 50 WBCs, and few bacteria.  Urine culture pending.  Blood culture x2 pending.  C. difficile PCR and GI pathogen panel pending.  Chest x-ray showing mild right basilar atelectasis and no focal consolidation. Patient received 1.5 L normal saline boluses.  Review of Systems:  All systems reviewed and apart from history of presenting illness, are negative.  Past Medical History:  Diagnosis Date  . Bladder cancer ([place])   . Cellulitis    Left leg   . Constipation   . Diabetes mellitus without complication ([place])   . Diabetic neuropathy ([place])    Feet  . Hematuria 04/21/2018  . Hyperlipidemia   . 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 Vasculitis ([place])    Left leg    Past  Surgical History:  Procedure Laterality Date  . CYSTOSCOPY WITH INJECTION N/A 11/26/2018   Procedure: CYSTOSCOPY WITH INJECTION OF INDOCYANINE GREEN DYE;  Surgeon: Alexis Frock, MD;  Location: [facility];  Service: Urology;  Laterality: N/A;  . IR IMAGING GUIDED PORT INSERTION  06/26/2018  . MOUTH SURGERY  1980  . TONSILLECTOMY     4th grade  . TRANSURETHRAL RESECTION OF BLADDER TUMOR N/A 06/16/2018   Procedure: TRANSURETHRAL RESECTION OF BLADDER TUMOR (TURBT);  Surgeon: Lucas Mallow, MD;  Location: [facility];  Service: Urology;  Laterality: N/A;     reports that he has quit smoking. He has never used smokeless tobacco. He reports previous alcohol use of about 1.0 standard drinks of alcohol per week. He reports previous drug use.  Allergies  Allergen Reactions  . Sulfa Antibiotics Hives    History reviewed. No pertinent family history.  Prior to Admission medications   Medication Sig Start Date End Date Taking? Authorizing Provider  acetaminophen (TYLENOL) 500 MG tablet Take 1,000 mg by mouth every 4 (four) hours as needed for fever or headache.    Yes [provider]  Cholecalciferol (VITAMIN D3) 125 MCG (5000 UT) CAPS Take 5,000 Units by mouth daily.   Yes [provider]  Cyanocobalamin (VITAMIN B-12 PO) Take 4,000 mcg by mouth daily.    Yes [provider]  Digestive Enzymes (DIGESTIVE ENZYME PO) Take 1 capsule by mouth 2 (two) times daily.    Yes [provider]  Emollient (CERAVE) CREA Apply 1 application topically 2 (two)  times daily.   Yes [provider]  ibuprofen (ADVIL) 200 MG tablet Take 800 mg by mouth every 6 (six) hours as needed for fever or moderate pain.   Yes [provider]  lidocaine-prilocaine (EMLA) cream Apply 1 application topically as needed. 06/17/18  Yes Wyatt Portela, MD  magnesium oxide (MAG-OX) 400 MG tablet TAKE 1 TABLET BY MOUTH EVERY DAY Patient taking differently: Take 400 mg by mouth daily.   01/06/19  Yes Wyatt Portela, MD  metFORMIN (GLUCOPHAGE) 500 MG tablet TAKE 1 TABLET BY MOUTH 2 (TWO) TIMES DAILY WITH A MEAL. MAKE OFFICE VISIT FOR REFILLS Patient taking differently: Take 500 mg by mouth daily with breakfast.  12/15/18  Yes [name], [name], MD  ondansetron (ZOFRAN ODT) 4 MG disintegrating tablet 4mg  ODT q4 hours prn nausea/vomit Patient taking differently: Take 4 mg by mouth every 4 (four) hours as needed for nausea or vomiting.  05/12/19  Yes Deno Etienne, DO  OVER THE COUNTER MEDICATION Take 240 mLs by mouth daily. [place] Liquid Supplement   Yes [provider]  OVER THE COUNTER MEDICATION 1,000 mg 2 (two) times daily. [place]   Yes [provider]  OVER THE COUNTER MEDICATION CTFO super fruit chew   Yes [provider]  [place] Men's 50+ gummy   Yes [provider]  polyethylene glycol powder (MIRALAX) 17 GM/SCOOP powder Take 1 Container by mouth daily.    Yes [provider]  Probiotic Product (PROBIOTIC DAILY PO) Take 1 capsule by mouth daily.   Yes [provider]  prochlorperazine (COMPAZINE) 10 MG tablet TAKE 1 TABLET BY MOUTH EVERY 6 HOURS AS NEEDED FOR NAUSEA OR VOMITING. Patient taking differently: Take 10 mg by mouth every 6 (six) hours as needed for nausea or vomiting.  11/04/18  Yes Wyatt Portela, MD  senna-docusate (SENNA S) 8.6-50 MG tablet Take 1 tablet by mouth 2 (two) times daily. 10/08/18  Yes Wyatt Portela, MD  tolnaftate (TINACTIN) 1 % spray Apply 1 spray topically daily as needed (for feet itching).   Yes [provider]  triamcinolone cream (KENALOG) 0.1 % Apply 1 application topically 2 (two) times daily as needed (for itching). 03/12/19  Yes [name], [name], MD  UNABLE TO FIND 1,000 mg 3 (three) times daily. Med Name:[facility]   Yes [provider]  Zinc 30 MG TABS Take 1 tablet by mouth daily.    Yes [provider]  cephALEXin (KEFLEX) 500 MG  capsule Take 1 capsule (500 mg total) by mouth 4 (four) times daily. Patient not taking: Reported on 05/27/2019 05/12/19   Deno Etienne, DO  LORazepam (ATIVAN) 0.5 MG tablet Place 1 tablet (0.5 mg total) under the tongue every 8 (eight) hours as needed (nausea). Patient not taking: Reported on 05/27/2019 09/26/18   Harle Stanford., PA-C    Physical Exam: Vitals:   05/27/19 2000 05/27/19 2030 05/27/19 2100 05/27/19 2130  BP: 132/62 (!) 150/59 (!) 151/65 (!) 154/70  Pulse: 85 93 85 97  Resp: (!) 22 (!) 21 20 (!) 24  Temp:      TempSrc:      SpO2: 98% 97% 96% 97%  Weight:      Height:        Physical Exam  Constitutional: He is oriented to person, place, and time. He appears well-developed and well-nourished. He appears distressed.  Vomiting  HENT:  Head: Normocephalic.  Eyes: Right eye exhibits no discharge. Left eye  exhibits no discharge.  Cardiovascular: Normal rate, regular rhythm and intact distal pulses.  Pulmonary/Chest: Effort normal. No respiratory distress. He has no wheezes.  Mild crackles appreciated at the right lung base No increased work of breathing  Abdominal: Soft. Bowel sounds are normal. He exhibits no distension. There is abdominal tenderness. There is guarding. There is no rebound.  Generalized tenderness to palpation with guarding Urostomy bag with a scant amount of urine containing pus  Musculoskeletal:        General: No edema.     Cervical back: Neck supple.  Neurological: He is alert and oriented to person, place, and time.  Skin: Skin is warm and dry. He is not diaphoretic.     Labs on Admission: I have personally reviewed following labs and imaging studies  CBC: Recent Labs  Lab 05/27/19 1923  WBC 18.3*  HGB 8.9*  HCT 27.8*  MCV 92.4  PLT 914   Basic Metabolic Panel: Recent Labs  Lab 05/27/19 1923  NA 132*  K 5.1  CL 96*  CO2 23  GLUCOSE 151*  BUN 67*  CREATININE 3.15*  CALCIUM 8.8*   GFR: Estimated Creatinine Clearance: 23.7  mL/min (A) (by C-G formula based on SCr of 3.15 mg/dL (H)). Liver Function Tests: Recent Labs  Lab 05/27/19 1923  AST 19  ALT 22  ALKPHOS 72  BILITOT 0.5  PROT 6.4*  ALBUMIN 2.8*   No results for input(s): LIPASE, AMYLASE in the last 168 hours. No results for input(s): AMMONIA in the last 168 hours. Coagulation Profile: No results for input(s): INR, PROTIME in the last 168 hours. Cardiac Enzymes: No results for input(s): CKTOTAL, CKMB, CKMBINDEX, TROPONINI in the last 168 hours. BNP (last 3 results) No results for input(s): PROBNP in the last 8760 hours. HbA1C: No results for input(s): HGBA1C in the last 72 hours. CBG: No results for input(s): GLUCAP in the last 168 hours. Lipid Profile: No results for input(s): CHOL, HDL, LDLCALC, TRIG, CHOLHDL, LDLDIRECT in the last 72 hours. Thyroid Function Tests: No results for input(s): TSH, T4TOTAL, FREET4, T3FREE, THYROIDAB in the last 72 hours. Anemia Panel: No results for input(s): VITAMINB12, FOLATE, FERRITIN, TIBC, IRON, RETICCTPCT in the last 72 hours. Urine analysis:    Component Value Date/Time   COLORURINE YELLOW (A) 05/27/2019 1923   APPEARANCEUR TURBID (A) 05/27/2019 1923   LABSPEC 1.013 05/27/2019 1923   PHURINE 7.0 05/27/2019 1923   GLUCOSEU NEGATIVE 05/27/2019 1923   GLUCOSEU > or = 1000 mg/dL (AA) 03/01/2008 1109   HGBUR MODERATE (A) 05/27/2019 1923   BILIRUBINUR NEGATIVE 05/27/2019 [place] 05/27/2019 1923   PROTEINUR 100 (A) 05/27/2019 1923   UROBILINOGEN 0.2 mg/dL 03/01/2008 1109   NITRITE NEGATIVE 05/27/2019 1923   LEUKOCYTESUR LARGE (A) 05/27/2019 1923    Radiological Exams on Admission: XR Chest Single View  Result Date: 05/27/2019 CLINICAL DATA:  Weakness, nausea, vomiting, diarrhea EXAM: PORTABLE CHEST 1 VIEW COMPARISON:  05/12/2019 FINDINGS: There is a right-sided Port-A-Cath. There is right basilar atelectasis. There is no focal consolidation. There is no pleural effusion or  pneumothorax. The heart and mediastinal contours are unremarkable. There is no acute osseous abnormality. IMPRESSION: Mild right basilar atelectasis. Otherwise no acute cardiopulmonary disease. Electronically Signed   By: Kathreen Devoid   On: 05/27/2019 19:49    EKG: Independently reviewed.  Normal sinus rhythm.  Assessment/Plan Principal Problem:   COVID-19 virus infection Active Problems:   UTI (urinary tract infection)   Abdominal pain, vomiting, and diarrhea  AKI (acute kidney injury) ([place])   Anemia   Emesis, diarrhea, abdominal pain - suspect due to COVID-19 viral gastroenteritis Patient is presenting with a 2-week history of intermittent nonbloody emesis and nonbloody diarrhea.  SARS-CoV-2 rapid antigen test positive.  Afebrile.  WBC count elevated at 18.3.  Lactic acid normal.  No signs of sepsis.  LFTs normal.  MiraLAX and senna are listed in home medications, unclear if patient is currently taking them.  At the time of my evaluation, noted to have large volume bilious emesis.  Abdomen tender to palpation with guarding. -CT abdomen pelvis without contrast -Zofran as needed for nausea/vomiting, Phenergan as needed -C. difficile PCR and GI pathogen panel pending -IV fluid hydration -Keep n.p.o. at this time -Continue to monitor WBC count -Blood culture x2 pending  Addendum: CT abdomen pelvis showing mild to moderate bilateral hydronephrosis, grossly stable on the left and slightly worsened on the right since 05/12/2019, small right pleural effusion, small amount of perihepatic and perisplenic ascites, and findings consistent with metastatic disease.  Please read full report for details.  Has known history of stage IV bladder cancer diagnosed in January 2020 status post radical surgery after neoadjuvant chemotherapy.  Please consult oncology and urology in a.m.  COVID-19 positive SARS-CoV-2 rapid antigen test positive.  Patient states he is coughing a little but denies dyspnea.  Not  hypoxic and no increased work of breathing.  Oxygen saturation 97-100% on room air. Chest x-ray showing mild right basilar atelectasis and no focal consolidation.  -Will hold off starting steroid and/or remdesivir at this time given no hypoxia or concern for pneumonia on x-ray. -Check procalcitonin level -Check inflammatory markers including ferritin, fibrinogen, D-dimer, LDH, CRP -Continuous pulse ox -Supplemental oxygen as needed to keep oxygen saturation above 90% -Incentive spirometry -Vitamin C, zinc, vitamin D when able to tolerate p.o. intake -Robitussin-DM as needed for cough -Airborne and contact precautions  Addendum: Inflammatory markers significantly elevated, D-dimer >20.  Patient is not tachycardic or hypoxic.  However, there remains concern for possible VTE/ PE given COVID-19 infection.  Unable to do CT angiogram chest due to renal insufficiency.  Will start heparin infusion empirically.  VQ scan ordered.  Bilateral lower extremity Dopplers ordered to rule out DVT.  UTI History of bladder cancer status post urostomy.  Afebrile.  WBC count elevated at 18.3.  On exam, urostomy bag with a scant amount of urine containing pus.  UA with large amount of leukocytes, 21-50 RBCs, greater than 50 WBCs, and few bacteria.   -Ceftriaxone -Urine culture pending -Urostomy care  AKI Likely prerenal due to dehydration in the setting of emesis/diarrhea and decreased p.o. intake. BUN 67, creatinine 3.1.  Creatinine ranging between 1.6-1.9 on recent labs but previously 1.0-1.4. -IV fluid hydration -Continue to monitor renal function and urine output -Avoid nephrotoxic agents/contrast -CT as above to see if there are any signs of obstructive uropathy.  If no improvement in renal function with IV fluid hydration, will need renal ultrasound as well.  Physical deconditioning Suspect related to UTI and acute viral illness. -Management as mentioned above -PT evaluation  Normocytic  anemia Hemoglobin 8.9 and MCV 92.4.  Hemoglobin was ranging between 10.4-11 on recent labs.  No signs of active bleeding. -Check FOBT -Check iron, ferritin, TIBC  Mild hyponatremia Suspect related to GI losses from emesis/diarrhea and decreased p.o. intake.  Sodium 133. -IV fluid hydration -Continue to monitor sodium level  Hypertension Systolic currently in 106Y. -Hydralazine as needed for SBP >160  Noninsulin-dependent type  2 diabetes Random blood glucose 151. -Check A1c.  Sliding scale insulin sensitive ACHS and CBG checks.  DVT prophylaxis: Lovenox Code Status: Patient wishes to be full code. Family Communication: No family at bedside. Disposition Plan: Anticipate discharge after clinical improvement. Consults called: None Admission status: It is my clinical opinion that admission to INPATIENT is reasonable and necessary in this 68 y.o. male . presenting with symptoms of emesis, diarrhea, abdominal pain, generalized weakness, fatigue, concerning for acute viral gastroenteritis secondary to COVID-19 viral infection.  Patient is dehydrated and has an AKI.  Actively vomiting and not able to tolerate p.o. intake.  Will need bowel rest and IV fluid hydration.  Given the aforementioned, the predictability of an adverse outcome is felt to be significant. I expect that the patient will require at least 2 midnights in the hospital to treat this condition.   The medical decision making on this patient was of high complexity and the patient is at high risk for clinical deterioration, therefore this is a level 3 visit.  Shela Leff MD [facility] Pager [phone]  If 7PM-7AM, please contact night-coverage [email or web address] Password [email or web address]  05/27/2019, 10:02 PM

## 2019-05-27 NOTE — ED Triage Notes (Signed)
Pt BIB [facility] from home with c/o of weakness, abdominal pain, N/V/D, and fever. Pt was taking an abx that ended 3 days ago. Since finishing abx, pt has experienced fevers that he has been treating at home with Tylenol. Pt states he took 1,000mg  at 1530 for a fever of 101.3, pt is now 99.5.

## 2019-05-27 NOTE — ED Notes (Signed)
Wife contacted [name] [phone]

## 2019-05-27 NOTE — Telephone Encounter (Signed)
Patient's wife called and stated patient is weak, tired, has no appetite, vomiting, nausea, and fever of 100.1. Patient's wife stated she called Dr. Zettie Pho office but has not received a return call. Informed patient's wife that patient should be evaluated by PCP or evaluated in the ED if she cannot reach Dr. Tresa Moore.  Dr. Alen Blew made aware and stated that this is not related to patient's cancer but could be related to his surgery and he agrees with evaluation by PCP or ED. No additional instructions. Patient's wife verbalized understanding and stated she will attempt to contact Dr. Tresa Moore again and if she is not successful she will contact PCP or take patient to the ED for further evaluation.

## 2019-05-27 NOTE — ED Provider Notes (Signed)
[facility] Emergency Department Provider Note MRN:  [id number]  Arrival date & time: 05/27/19     Chief Complaint   Weakness   History of Present Illness   Sergio Pennington is a 68 y.o. year-old male with a history of bladder cancer status post urostomy, diabetes presenting to the ED with chief complaint of weakness.  Patient reports fever at home today as well as generalized weakness.  Denies chest pain or shortness of breath.  Endorsing occasional nausea, vomiting, and diarrhea for the past 2 weeks, as well as some generalized abdominal discomfort or cramping.  Completed a course of Keflex about 4 days ago.  Feels like his symptoms have worsened since he stopped the antibiotics.  Review of Systems  A complete 10 system review of systems was obtained and all systems are negative except as noted in the HPI and PMH.   Patient's Health History    Past Medical History:  Diagnosis Date  . Bladder cancer ([place])   . Cellulitis    Left leg   . Constipation   . Diabetes mellitus without complication ([place])   . Diabetic neuropathy ([place])    Feet  . Hematuria 04/21/2018  . Hyperlipidemia   . 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 Vasculitis ([place])    Left leg    Past Surgical History:  Procedure Laterality Date  . CYSTOSCOPY WITH INJECTION N/A 11/26/2018   Procedure: CYSTOSCOPY WITH INJECTION OF INDOCYANINE GREEN DYE;  Surgeon: Alexis Frock, MD;  Location: [facility];  Service: Urology;  Laterality: N/A;  . IR IMAGING GUIDED PORT INSERTION  06/26/2018  . MOUTH SURGERY  1980  . TONSILLECTOMY     4th grade  . TRANSURETHRAL RESECTION OF BLADDER TUMOR N/A 06/16/2018   Procedure: TRANSURETHRAL RESECTION OF BLADDER TUMOR (TURBT);  Surgeon: Lucas Mallow, MD;  Location: [facility];  Service: Urology;  Laterality: N/A;    History reviewed. No pertinent family history.  Social History   Socioeconomic History  . Marital  status: Married    Spouse name: Not on file  . Number of children: Not on file  . Years of education: Not on file  . Highest education level: Not on file  Occupational History  . Not on file  Tobacco Use  . Smoking status: Former Research scientist (life sciences)  . Smokeless tobacco: Never Used  Substance and Sexual Activity  . Alcohol use: Not Currently    Alcohol/week: 1.0 standard drinks    Types: 1 Glasses of wine per week  . Drug use: Not Currently  . Sexual activity: Not on file  Other Topics Concern  . Not on file  Social History Narrative  . Not on file   Social Determinants of Health   Financial Resource Strain:   . Difficulty of Paying Living Expenses: Not on file  Food Insecurity:   . Worried About Charity fundraiser in the Last Year: Not on file  . Ran Out of Food in the Last Year: Not on file  Transportation Needs:   . Lack of Transportation (Medical): Not on file  . Lack of Transportation (Non-Medical): Not on file  Physical Activity:   . Days of Exercise per Week: Not on file  . Minutes of Exercise per Session: Not on file  Stress:   . Feeling of Stress : Not on file  Social Connections:   . Frequency of Communication with Friends and Family: Not on file  .  Frequency of Social Gatherings with Friends and Family: Not on file  . Attends Religious Services: Not on file  . Active Member of Clubs or Organizations: Not on file  . Attends Archivist Meetings: Not on file  . Marital Status: Not on file  Intimate Partner Violence:   . Fear of Current or Ex-Partner: Not on file  . Emotionally Abused: Not on file  . Physically Abused: Not on file  . Sexually Abused: Not on file     Physical Exam  Vital Signs and Nursing Notes reviewed Vitals:   05/27/19 1930 05/27/19 2000  BP: 137/60 132/62  Pulse: 80 85  Resp: 17 (!) 22  Temp:    SpO2: 98% 98%    CONSTITUTIONAL: Well-appearing, NAD NEURO:  Alert and oriented x 3, no focal deficits EYES:  eyes equal and  reactive ENT/NECK:  no LAD, no JVD CARDIO: Regular rate, well-perfused, normal S1 and S2 PULM:  CTAB no wheezing or rhonchi GI/GU:  normal bowel sounds, non-distended, non-tender; urostomy in place right lower quadrant, urine present in the bag with white sediment MSK/SPINE:  No gross deformities, no edema SKIN:  no rash, atraumatic PSYCH:  Appropriate speech and behavior  Diagnostic and Interventional Summary    EKG Interpretation  Date/Time:  Wednesday May 27 2019 19:27:03 EST Ventricular Rate:  81 PR Interval:    QRS Duration: 110 QT Interval:  360 QTC Calculation: 418 R Axis:   -26 Text Interpretation: Sinus rhythm Borderline left axis deviation Probable anteroseptal infarct, old Confirmed by Gerlene Fee [phone]) on 05/27/2019 8:16:39 PM      Labs Reviewed  CBC - Abnormal; Notable for the following components:      Result Value   WBC 18.3 (*)    RBC 3.01 (*)    Hemoglobin 8.9 (*)    HCT 27.8 (*)    All other components within normal limits  COMPREHENSIVE METABOLIC PANEL - Abnormal; Notable for the following components:   Sodium 132 (*)    Chloride 96 (*)    Glucose, Bld 151 (*)    BUN 67 (*)    Creatinine, Ser 3.15 (*)    Calcium 8.8 (*)    Total Protein 6.4 (*)    Albumin 2.8 (*)    GFR calc non Af Amer 19 (*)    GFR calc Af Amer 22 (*)    All other components within normal limits  URINALYSIS, ROUTINE W REFLEX MICROSCOPIC - Abnormal; Notable for the following components:   Color, Urine YELLOW (*)    APPearance TURBID (*)    Hgb urine dipstick MODERATE (*)    Protein, ur 100 (*)    Leukocytes,Ua LARGE (*)    WBC, UA >50 (*)    Bacteria, UA FEW (*)    Non Squamous Epithelial 6-10 (*)    All other components within normal limits  POC SARS CORONAVIRUS 2 AG -  ED - Abnormal; Notable for the following components:   SARS Coronavirus 2 Ag POSITIVE (*)    All other components within normal limits  CULTURE, BLOOD (SINGLE)  GI PATHOGEN PANEL BY PCR, STOOL  C  DIFFICILE QUICK SCREEN W PCR REFLEX  URINE CULTURE  LACTIC ACID, PLASMA    XR Chest Single View  Final Result      Medications  sodium chloride 0.9 % bolus 1,000 mL (has no administration in time range)  sodium chloride 0.9 % bolus 500 mL (0 mLs Intravenous Stopped 05/27/19 2022)  Procedures  /  Critical Care Procedures  ED Course and Medical Decision Making  I have reviewed the triage vital signs and the nursing notes.  Pertinent labs & imaging results that were available during my care of the patient were reviewed by me and considered in my medical decision making (see below for details).     Concern for recurrence of UTI or pneumonia, possibly early sepsis though patient is currently well-appearing, conversant, normal neurological exam, no true fever here but reportedly 102 at home, normotensive, not tachycardic.  Nontender abdomen.  Also considering coronavirus.  Work-up is pending.  Sergio Pennington was evaluated in Emergency Department on 05/27/2019 for the symptoms described in the history of present illness. He was evaluated in the context of the global COVID-19 pandemic, which necessitated consideration that the patient might be at risk for infection with the SARS-CoV-2 virus that causes COVID-19. Institutional protocols and algorithms that pertain to the evaluation of patients at risk for COVID-19 are in a state of rapid change based on information released by regulatory bodies including the CDC and federal and state organizations. These policies and algorithms were followed during the patient's care in the ED.   Patient has tested positive for coronavirus.  Labs reveal significant AKI.  Will admit to hospital service for further care.  Barth Kirks. Sedonia Small, MD [place] [email or web address]  Final Clinical Impressions(s) / ED Diagnoses     ICD-10-CM   1. COVID-19  U07.1   2. Weakness  R53.1 XR Chest Single  View    XR Chest Single View  3. AKI (acute kidney injury) ([place])  N17.9   4. Nausea vomiting and diarrhea  R11.2    R19.7     ED Discharge Orders    None       Discharge Instructions Discussed with and Provided to Patient:   Discharge Instructions   None       Maudie Flakes, MD 05/27/19 2024

## 2019-05-27 NOTE — ED Notes (Signed)
Admitting MD, Dr. Marlowe Sax at bedside.

## 2019-05-27 NOTE — ED Notes (Signed)
EDP at bedside  

## 2019-05-27 NOTE — ED Notes (Signed)
Pt transported to CT ?

## 2019-05-28 ENCOUNTER — Inpatient Hospital Stay (HOSPITAL_COMMUNITY): Payer: Medicare Other

## 2019-05-28 DIAGNOSIS — U071 COVID-19: Secondary | ICD-10-CM

## 2019-05-28 DIAGNOSIS — R791 Abnormal coagulation profile: Secondary | ICD-10-CM

## 2019-05-28 LAB — CBG MONITORING, ED
Glucose-Capillary: 135 mg/dL — ABNORMAL HIGH (ref 70–99)
Glucose-Capillary: 132 mg/dL — ABNORMAL HIGH (ref 70–99)

## 2019-05-28 LAB — BASIC METABOLIC PANEL
Anion gap: 14 (ref 5–15)
BUN: 68 mg/dL — ABNORMAL HIGH (ref 8–23)
CO2: 20 mmol/L — ABNORMAL LOW (ref 22–32)
Calcium: 8.3 mg/dL — ABNORMAL LOW (ref 8.9–10.3)
Chloride: 100 mmol/L (ref 98–111)
Creatinine, Ser: 3.01 mg/dL — ABNORMAL HIGH (ref 0.61–1.24)
GFR calc Af Amer: 23 mL/min — ABNORMAL LOW (ref >60)
GFR calc non Af Amer: 20 mL/min — ABNORMAL LOW (ref >60)
Glucose, Bld: 133 mg/dL — ABNORMAL HIGH (ref 70–99)
Potassium: 4.7 mmol/L (ref 3.5–5.1)
Sodium: 134 mmol/L — ABNORMAL LOW (ref 135–145)

## 2019-05-28 LAB — IRON AND TIBC
Iron: 14 ug/dL — ABNORMAL LOW (ref 45–182)
Saturation Ratios: 7 % — ABNORMAL LOW (ref 17.9–39.5)
TIBC: 211 ug/dL — ABNORMAL LOW (ref 250–450)
UIBC: 197 ug/dL

## 2019-05-28 LAB — HEPARIN LEVEL (UNFRACTIONATED)
Heparin Unfractionated: 0.46 IU/mL (ref 0.30–0.70)
Heparin Unfractionated: 0.18 IU/mL — ABNORMAL LOW (ref 0.30–0.70)

## 2019-05-28 LAB — HEMOGLOBIN A1C
Hgb A1c MFr Bld: 6.4 % — ABNORMAL HIGH (ref 4.8–5.6)
Mean Plasma Glucose: 136.98 mg/dL

## 2019-05-28 LAB — GLUCOSE, CAPILLARY
Glucose-Capillary: 130 mg/dL — ABNORMAL HIGH (ref 70–99)
Glucose-Capillary: 142 mg/dL — ABNORMAL HIGH (ref 70–99)
Glucose-Capillary: 216 mg/dL — ABNORMAL HIGH (ref 70–99)

## 2019-05-28 LAB — CBC WITH DIFFERENTIAL/PLATELET
Abs Immature Granulocytes: 0.88 10*3/uL — ABNORMAL HIGH (ref 0.00–0.07)
Basophils Absolute: 0.1 10*3/uL (ref 0.0–0.1)
Basophils Relative: 0 %
Eosinophils Absolute: 0 10*3/uL (ref 0.0–0.5)
Eosinophils Relative: 0 %
HCT: 27.7 % — ABNORMAL LOW (ref 39.0–52.0)
Hemoglobin: 9.1 g/dL — ABNORMAL LOW (ref 13.0–17.0)
Immature Granulocytes: 5 %
Lymphocytes Relative: 6 %
Lymphs Abs: 1 10*3/uL (ref 0.7–4.0)
MCH: 30.8 pg (ref 26.0–34.0)
MCHC: 32.9 g/dL (ref 30.0–36.0)
MCV: 93.9 fL (ref 80.0–100.0)
Monocytes Absolute: 1.3 10*3/uL — ABNORMAL HIGH (ref 0.1–1.0)
Monocytes Relative: 7 %
Neutro Abs: 14.1 10*3/uL — ABNORMAL HIGH (ref 1.7–7.7)
Neutrophils Relative %: 82 %
Platelets: 268 10*3/uL (ref 150–400)
RBC: 2.95 MIL/uL — ABNORMAL LOW (ref 4.22–5.81)
RDW: 13.8 % (ref 11.5–15.5)
WBC: 17.3 10*3/uL — ABNORMAL HIGH (ref 4.0–10.5)
nRBC: 0 % (ref 0.0–0.2)

## 2019-05-28 LAB — FERRITIN: Ferritin: 569 ng/mL — ABNORMAL HIGH (ref 24–336)

## 2019-05-28 LAB — HIV ANTIBODY (ROUTINE TESTING W REFLEX): HIV Screen 4th Generation wRfx: NONREACTIVE

## 2019-05-28 MED ORDER — SODIUM CHLORIDE 0.9 % IV SOLN: INTRAVENOUS | Status: AC

## 2019-05-28 MED ORDER — DEXAMETHASONE SODIUM PHOSPHATE 10 MG/ML IJ SOLN: 6.0000 mg | INTRAMUSCULAR | Status: DC

## 2019-05-28 MED ORDER — CHLORHEXIDINE GLUCONATE CLOTH 2 % EX PADS
6.0000 | MEDICATED_PAD | Freq: Every day | CUTANEOUS | Status: DC
  Administered 2019-05-28 – 2019-06-05 (×9): 6 via TOPICAL

## 2019-05-28 MED ORDER — HEPARIN (PORCINE) 25000 UT/250ML-% IV SOLN
1700.0000 [IU]/h | INTRAVENOUS | Status: DC
  Administered 2019-05-28: 1500 [IU]/h via INTRAVENOUS
  Administered 2019-05-28: 02:00:00 1350 [IU]/h via INTRAVENOUS
  Administered 2019-05-29 – 2019-05-30 (×2): 1600 [IU]/h via INTRAVENOUS
  Administered 2019-05-30 – 2019-06-02 (×5): 1700 [IU]/h via INTRAVENOUS
  Filled 2019-05-28 (×10): qty 250

## 2019-05-28 MED ORDER — HEPARIN BOLUS VIA INFUSION
1500.0000 [IU] | Freq: Once | INTRAVENOUS | Status: AC
  Administered 2019-05-28: 1500 [IU] via INTRAVENOUS
  Filled 2019-05-28: qty 1500

## 2019-05-28 NOTE — Consult Note (Signed)
H&P Physician requesting consult: Dr. 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  He was found to be COVID+ in the ER. WBC 18.3. CT of the abdomen and pelvis shows mild to moderate bilateral hydronephrosis that is stable on the left and slightly worsened on the right since prior imaging 05/12/2019. Patient's creatinine is also elevated at 3 from a baseline in 1s.   Past Medical History:  Diagnosis Date  . Bladder cancer ([place])   . Cellulitis    Left leg   . Constipation   . Diabetes mellitus without complication ([place])   . Diabetic neuropathy ([place])    Feet  . Hematuria 04/21/2018  . Hyperlipidemia   . 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 Vasculitis ([place])    Left leg   Past Surgical History:  Procedure Laterality Date  . CYSTOSCOPY WITH INJECTION N/A 11/26/2018   Procedure: CYSTOSCOPY WITH INJECTION OF INDOCYANINE GREEN DYE;  Surgeon: Alexis Frock, MD;  Location: [facility];  Service: Urology;  Laterality: N/A;  . IR IMAGING GUIDED PORT INSERTION  06/26/2018  . MOUTH SURGERY  1980  . TONSILLECTOMY     4th grade  . TRANSURETHRAL RESECTION OF BLADDER TUMOR N/A 06/16/2018   Procedure: TRANSURETHRAL RESECTION OF BLADDER TUMOR (TURBT);  Surgeon: Lucas Mallow, MD;  Location: [facility];  Service: Urology;  Laterality: N/A;    Home Medications:  Medications Prior to Admission  Medication Sig Dispense Refill Last Dose  . acetaminophen (TYLENOL) 500 MG tablet Take 1,000 mg by mouth every 4 (four) hours as needed for fever or headache.    05/27/2019 at Unknown time  . Cholecalciferol (VITAMIN D3) 125 MCG (5000 UT) CAPS Take 5,000 Units by mouth daily.   05/27/2019 at Unknown time  . Cyanocobalamin (VITAMIN B-12 PO) Take 4,000 mcg by mouth daily.     05/27/2019 at Unknown time  . Digestive Enzymes (DIGESTIVE ENZYME PO) Take 1 capsule by mouth 2 (two) times daily.    05/27/2019 at Unknown time  . Emollient (CERAVE) CREA Apply 1 application topically 2 (two) times daily.   05/27/2019 at Unknown time  . ibuprofen (ADVIL) 200 MG tablet Take 800 mg by mouth every 6 (six) hours as needed for fever or moderate pain.   05/26/2019 at Unknown time  . lidocaine-prilocaine (EMLA) cream Apply 1 application topically as needed. 30 g 0 Past Month at Unknown time  . magnesium oxide (MAG-OX) 400 MG tablet TAKE 1 TABLET BY MOUTH EVERY DAY (Patient taking differently: Take 400 mg by mouth daily. ) 30 tablet 0 05/27/2019 at Unknown time  . metFORMIN (GLUCOPHAGE) 500 MG tablet TAKE 1 TABLET BY MOUTH 2 (TWO) TIMES DAILY WITH A MEAL. MAKE OFFICE VISIT FOR REFILLS (Patient taking differently: Take 500 mg by mouth daily with breakfast. ) 180 tablet 1 05/27/2019 at Unknown time  . ondansetron (ZOFRAN ODT) 4 MG disintegrating tablet 36m ODT q4 hours prn nausea/vomit (Patient taking differently: Take 4 mg by mouth every 4 (four) hours as needed for nausea or vomiting. ) 20 tablet 0 Past Week at Unknown time  . OVER THE COUNTER MEDICATION Take 240 mLs by mouth daily. Asca Liquid Supplement   05/27/2019 at Unknown time  . OVER THE COUNTER MEDICATION 1,000 mg 2 (two) times daily. [place]  05/27/2019 at Unknown time  . OVER THE COUNTER MEDICATION CTFO super fruit chew   05/27/2019 at Unknown time  . OVER THE COUNTER MEDICATION Alive Men's 50+ gummy   05/27/2019 at Unknown time  . polyethylene glycol powder (MIRALAX) 17 GM/SCOOP powder Take 1 Container by mouth daily.    05/27/2019 at Unknown time  . Probiotic Product (PROBIOTIC DAILY PO) Take 1 capsule by mouth daily.   05/27/2019 at Unknown time  . prochlorperazine (COMPAZINE) 10 MG tablet TAKE 1 TABLET BY MOUTH EVERY 6 HOURS AS NEEDED FOR NAUSEA OR VOMITING. (Patient taking differently: Take 10 mg by mouth every 6 (six) hours  as needed for nausea or vomiting. ) 30 tablet 0 Past Week at Unknown time  . senna-docusate (SENNA S) 8.6-50 MG tablet Take 1 tablet by mouth 2 (two) times daily. 90 tablet 3 05/27/2019 at Unknown time  . tolnaftate (TINACTIN) 1 % spray Apply 1 spray topically daily as needed (for feet itching).   Past Month at Unknown time  . triamcinolone cream (KENALOG) 0.1 % Apply 1 application topically 2 (two) times daily as needed (for itching). 80 g 1 Past Month at Unknown time  . UNABLE TO FIND 1,000 mg 3 (three) times daily. Med Name:[facility]   05/27/2019 at Unknown time  . Zinc 30 MG TABS Take 1 tablet by mouth daily.    05/27/2019 at Unknown time  . cephALEXin (KEFLEX) 500 MG capsule Take 1 capsule (500 mg total) by mouth 4 (four) times daily. (Patient not taking: Reported on 05/27/2019) 20 capsule 0 Not Taking at Unknown time  . LORazepam (ATIVAN) 0.5 MG tablet Place 1 tablet (0.5 mg total) under the tongue every 8 (eight) hours as needed (nausea). (Patient not taking: Reported on 05/27/2019) 30 tablet 0 Not Taking at Unknown time   Allergies:  Allergies  Allergen Reactions  . Sulfa Antibiotics Hives    History reviewed. No pertinent family history. Social History:  reports that he has quit smoking. He has never used smokeless tobacco. He reports previous alcohol use of about 1.0 standard drinks of alcohol per week. He reports previous drug use.  ROS: A complete review of systems was performed.  All systems are negative except for pertinent findings as noted. ROS   Physical Exam:  Vital signs in last 24 hours: Temp:  [98.6 F (37 C)-101.7 F (38.7 C)] 100.3 F (37.9 C) (12/31 1655) Pulse Rate:  [80-97] 88 (12/31 1655) Resp:  [13-27] 18 (12/31 1655) BP: (127-161)/(57-86) 154/60 (12/31 1655) SpO2:  [93 %-100 %] 97 % (12/31 1655) Weight:  [76.6 kg] 76.6 kg (12/30 1846) General:  Alert and oriented, No acute distress HEENT: Normocephalic, atraumatic Neck: No JVD or  lymphadenopathy Cardiovascular: Regular rate and rhythm Lungs: Regular rate and effort Abdomen: Soft, nontender, nondistended, no abdominal masses; ileal conduit draining yellow urine with mucus Back: No CVA tenderness Extremities: No edema Neurologic: Grossly intact  Laboratory Data:  Results for orders placed or performed during the hospital encounter of 05/27/19 (from the past 24 hour(s))  Hemoglobin A1c     Status: Abnormal   Collection Time: 05/27/19  7:20 PM  Result Value Ref Range   Hgb A1c MFr Bld 6.4 (H) 4.8 - 5.6 %   Mean Plasma Glucose 136.98 mg/dL  HIV Antibody (routine testing w rflx)     Status: None   Collection Time: 05/27/19  7:20 PM  Result Value Ref Range   HIV Screen 4th Generation wRfx NON REACTIVE NON REACTIVE  Ferritin  Status: Abnormal   Collection Time: 05/27/19  7:20 PM  Result Value Ref Range   Ferritin 586 (H) 24 - 336 ng/mL  Fibrinogen     Status: Abnormal   Collection Time: 05/27/19  7:20 PM  Result Value Ref Range   Fibrinogen 578 (H) 210 - 475 mg/dL  D-dimer, quantitative (not at [facility])     Status: Abnormal   Collection Time: 05/27/19  7:20 PM  Result Value Ref Range   D-Dimer, Quant >20.00 (H) 0.00 - 0.50 ug/mL-FEU  C-reactive protein     Status: Abnormal   Collection Time: 05/27/19  7:20 PM  Result Value Ref Range   CRP 20.3 (H) <1.0 mg/dL  Lactate dehydrogenase     Status: None   Collection Time: 05/27/19  7:20 PM  Result Value Ref Range   LDH 150 98 - 192 U/L  Procalcitonin     Status: None   Collection Time: 05/27/19  7:20 PM  Result Value Ref Range   Procalcitonin 0.96 ng/mL  Blood Culture x 1     Status: None (Preliminary result)   Collection Time: 05/27/19  7:23 PM   Specimen: BLOOD RIGHT FOREARM  Result Value Ref Range   Specimen Description      BLOOD RIGHT FOREARM BOTTLES DRAWN AEROBIC AND ANAEROBIC Blood Culture results may not be optimal due to an inadequate volume of blood received in culture bottles Performed at [facility], [place] [address] [place], [place] [address]    Special Requests      NONE Performed at [facility], [place] [address], [place], [place] [address]    Culture      NO GROWTH < 24 HOURS Performed at [place] [address] [place], [place] [address]    Report Status PENDING   CBC     Status: Abnormal   Collection Time: 05/27/19  7:23 PM  Result Value Ref Range   WBC 18.3 (H) 4.0 - 10.5 K/uL   RBC 3.01 (L) 4.22 - 5.81 MIL/uL   Hemoglobin 8.9 (L) 13.0 - 17.0 g/dL   HCT 27.8 (L) 39.0 - 52.0 %   MCV 92.4 80.0 - 100.0 fL   MCH 29.6 26.0 - 34.0 pg   MCHC 32.0 30.0 - 36.0 g/dL   RDW 13.6 11.5 - 15.5 %   Platelets 264 150 - 400 K/uL   nRBC 0.0 0.0 - 0.2 %  CMP     Status: Abnormal   Collection Time: 05/27/19  7:23 PM  Result Value Ref Range   Sodium 132 (L) 135 - 145 mmol/L   Potassium 5.1 3.5 - 5.1 mmol/L   Chloride 96 (L) 98 - 111 mmol/L   CO2 23 22 - 32 mmol/L   Glucose, Bld 151 (H) 70 - 99 mg/dL   BUN 67 (H) 8 - 23 mg/dL   Creatinine, Ser 3.15 (H) 0.61 - 1.24 mg/dL   Calcium 8.8 (L) 8.9 - 10.3 mg/dL   Total Protein 6.4 (L) 6.5 - 8.1 g/dL   Albumin 2.8 (L) 3.5 - 5.0 g/dL   AST 19 15 - 41 U/L   ALT 22 0 - 44 U/L   Alkaline Phosphatase 72 38 - 126 U/L   Total Bilirubin 0.5 0.3 - 1.2 mg/dL   GFR calc non Af Amer 19 (L) >60 mL/min   GFR calc Af Amer 22 (L) >60 mL/min   Anion gap 13 5 - 15  Lactic acid  Status: None   Collection Time: 05/27/19  7:23 PM  Result Value Ref Range   Lactic Acid, Venous 0.9 0.5 - 1.9 mmol/L  Urinalysis     Status: Abnormal   Collection Time: 05/27/19  7:23 PM  Result Value Ref Range   Color, Urine YELLOW (A) YELLOW   APPearance TURBID (A) CLEAR   Specific Gravity, Urine 1.013 1.005 - 1.030   pH 7.0 5.0 - 8.0   Glucose, UA NEGATIVE NEGATIVE mg/dL   Hgb urine dipstick MODERATE (A) NEGATIVE   Bilirubin Urine NEGATIVE NEGATIVE   Ketones, ur NEGATIVE NEGATIVE mg/dL   Protein, ur 100 (A)  NEGATIVE mg/dL   Nitrite NEGATIVE NEGATIVE   Leukocytes,Ua LARGE (A) NEGATIVE   RBC / HPF 21-50 0 - 5 RBC/hpf   WBC, UA >50 (H) 0 - 5 WBC/hpf   Bacteria, UA FEW (A) NONE SEEN   WBC Clumps PRESENT    Non Squamous Epithelial 6-10 (A) NONE SEEN  POC SARS Coronavirus 2 Ag-ED - Nasal Swab (BD Veritor Kit)     Status: Abnormal   Collection Time: 05/27/19  7:52 PM  Result Value Ref Range   SARS Coronavirus 2 Ag POSITIVE (A) NEGATIVE  ABO/Rh     Status: None   Collection Time: 05/27/19  8:43 PM  Result Value Ref Range   ABO/RH(D)      O POS Performed at [facility], [place] [address] [place], [place] [address]   C Difficile Quick Screen w PCR reflex     Status: None   Collection Time: 05/27/19  9:32 PM   Specimen: Stool  Result Value Ref Range   C Diff antigen NEGATIVE NEGATIVE   C Diff toxin NEGATIVE NEGATIVE   C Diff interpretation No C. difficile detected.   Occult blood card to lab, stool     Status: None   Collection Time: 05/27/19  9:32 PM  Result Value Ref Range   Fecal Occult Bld NEGATIVE NEGATIVE  CBG monitoring, ED     Status: Abnormal   Collection Time: 05/27/19 10:23 PM  Result Value Ref Range   Glucose-Capillary 135 (H) 70 - 99 mg/dL   Comment 1 Notify RN   CBG monitoring, ED     Status: Abnormal   Collection Time: 05/28/19  8:23 AM  Result Value Ref Range   Glucose-Capillary 132 (H) 70 - 99 mg/dL  Iron and TIBC     Status: Abnormal   Collection Time: 05/28/19  8:37 AM  Result Value Ref Range   Iron 14 (L) 45 - 182 ug/dL   TIBC 211 (L) 250 - 450 ug/dL   Saturation Ratios 7 (L) 17.9 - 39.5 %   UIBC 197 ug/dL  Ferritin     Status: Abnormal   Collection Time: 05/28/19  8:37 AM  Result Value Ref Range   Ferritin 569 (H) 24 - 336 ng/mL  CBC with Differential/Platelet     Status: Abnormal   Collection Time: 05/28/19  9:40 AM  Result Value Ref Range   WBC 17.3 (H) 4.0 - 10.5 K/uL   RBC 2.95 (L) 4.22 - 5.81 MIL/uL   Hemoglobin 9.1 (L) 13.0 - 17.0  g/dL   HCT 27.7 (L) 39.0 - 52.0 %   MCV 93.9 80.0 - 100.0 fL   MCH 30.8 26.0 - 34.0 pg   MCHC 32.9 30.0 - 36.0 g/dL   RDW 13.8 11.5 - 15.5 %   Platelets 268 150 - 400 K/uL   nRBC 0.0 0.0 -  0.2 %   Neutrophils Relative % 82 %   Neutro Abs 14.1 (H) 1.7 - 7.7 K/uL   Lymphocytes Relative 6 %   Lymphs Abs 1.0 0.7 - 4.0 K/uL   Monocytes Relative 7 %   Monocytes Absolute 1.3 (H) 0.1 - 1.0 K/uL   Eosinophils Relative 0 %   Eosinophils Absolute 0.0 0.0 - 0.5 K/uL   Basophils Relative 0 %   Basophils Absolute 0.1 0.0 - 0.1 K/uL   Immature Granulocytes 5 %   Abs Immature Granulocytes 0.88 (H) 0.00 - 0.07 K/uL  Basic metabolic panel     Status: Abnormal   Collection Time: 05/28/19  9:40 AM  Result Value Ref Range   Sodium 134 (L) 135 - 145 mmol/L   Potassium 4.7 3.5 - 5.1 mmol/L   Chloride 100 98 - 111 mmol/L   CO2 20 (L) 22 - 32 mmol/L   Glucose, Bld 133 (H) 70 - 99 mg/dL   BUN 68 (H) 8 - 23 mg/dL   Creatinine, Ser 3.01 (H) 0.61 - 1.24 mg/dL   Calcium 8.3 (L) 8.9 - 10.3 mg/dL   GFR calc non Af Amer 20 (L) >60 mL/min   GFR calc Af Amer 23 (L) >60 mL/min   Anion gap 14 5 - 15  Heparin level (unfractionated)     Status: None   Collection Time: 05/28/19  9:40 AM  Result Value Ref Range   Heparin Unfractionated 0.46 0.30 - 0.70 IU/mL  Glucose, capillary     Status: Abnormal   Collection Time: 05/28/19 11:25 AM  Result Value Ref Range   Glucose-Capillary 130 (H) 70 - 99 mg/dL  Heparin level (unfractionated)     Status: Abnormal   Collection Time: 05/28/19  3:34 PM  Result Value Ref Range   Heparin Unfractionated 0.18 (L) 0.30 - 0.70 IU/mL  Glucose, capillary     Status: Abnormal   Collection Time: 05/28/19  4:08 PM  Result Value Ref Range   Glucose-Capillary 142 (H) 70 - 99 mg/dL   Recent Results (from the past 240 hour(s))  Blood Culture x 1     Status: None (Preliminary result)   Collection Time: 05/27/19  7:23 PM   Specimen: BLOOD RIGHT FOREARM  Result Value Ref Range Status    Specimen Description   Final    BLOOD RIGHT FOREARM BOTTLES DRAWN AEROBIC AND ANAEROBIC Blood Culture results may not be optimal due to an inadequate volume of blood received in culture bottles Performed at [facility], [place] [address] [place], [place] [address]    Special Requests   Final    NONE Performed at [facility], [place] [address] [place], [place] [address]    Culture   Final    NO GROWTH < 24 HOURS Performed at [place] [address] [place], [place] [address]    Report Status PENDING  Incomplete  C Difficile Quick Screen w PCR reflex     Status: None   Collection Time: 05/27/19  9:32 PM   Specimen: Stool  Result Value Ref Range Status   C Diff antigen NEGATIVE NEGATIVE Final   C Diff toxin NEGATIVE NEGATIVE Final   C Diff interpretation No C. difficile detected.  Final    Comment: Performed at [facility], [place] [address], [place], [place] [address]   Creatinine: Recent Labs    05/27/19 1923 05/28/19 0940  CREATININE 3.15* 3.01*   CT Abd/Pelvis 05/27/19 IMPRESSION: 1. Status  post bladder resection with right lower quadrant urostomy. Mild to moderate bilateral hydronephrosis, grossly stable on the left and slightly worsened on the right since 05/12/2019. 2. New small right pleural effusion and new small amount of perihepatic and perisplenic ascites. 3. Grossly stable retroperitoneal, pelvic sidewall, and iliac adenopathy consistent with metastatic disease. Numerous pelvic nodules. Slight increased fluid and nodularity in the right gutter with persistent ill-defined soft tissue mass in the left pelvis, all consistent with metastatic disease. Grossly stable lobulated soft tissue density in the pelvis concerning for residual or recurrent disease. Irregular soft tissue density in the right lower quadrant with associated surgical sutures presumably related to history of urostomy surgery, though  with slightly masslike features; additional metastatic disease cannot be excluded.  Impression/Assessment:  68 year old man with metastatic bladder cancer status post radical cystectomy with ileal conduit admitted for Covid positive with GI symptoms found to have AKI.   Plan:  -ileal conduit appears to be draining well -recommend IVF hydration and trend creatinine; AKI likely from prolonged GI symptoms -if creatinine comes down with hydration consider repeat imaging with renal US in a few days -if it does not improve will consider possible PCN tube placement on the left  Verlaine Embry D Kariss Longmire 05/28/2019, 5:56 PM

## 2019-05-28 NOTE — Progress Notes (Signed)
ANTICOAGULATION CONSULT NOTE - Initial Consult  Pharmacy Consult for Heparin Indication: pulmonary embolus  Allergies  Allergen Reactions  . Sulfa Antibiotics Hives    Patient Measurements: Height: 6' (182.9 cm) Weight: 168 lb 15.7 oz (76.6 kg) IBW/kg (Calculated) : 77.6 Heparin Dosing Weight:   Vital Signs: Temp: 98.6 F (37 C) (12/31 0113) Temp Source: Oral (12/31 0113) BP: 141/65 (12/30 2330) Pulse Rate: 96 (12/30 2330)  Labs: Recent Labs    05/27/19 1923  HGB 8.9*  HCT 27.8*  PLT 264  CREATININE 3.15*    Estimated Creatinine Clearance: 23.7 mL/min (A) (by C-G formula based on SCr of 3.15 mg/dL (H)).   Medical History: Past Medical History:  Diagnosis Date  . Bladder cancer ([place])   . Cellulitis    Left leg   . Constipation   . Diabetes mellitus without complication ([place])   . Diabetic neuropathy ([place])    Feet  . Hematuria 04/21/2018  . Hyperlipidemia   . 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 Vasculitis ([facility])    Left leg    Medications:  Infusions:  . sodium chloride 125 mL/hr at 05/28/19 0050  . cefTRIAXone (ROCEPHIN)  IV Stopped (05/27/19 2351)  . heparin      Assessment: Patient with + D-Dimer and r/o PE.  Enoxaparin charted 12/30 ~2215.    Goal of Therapy:  Heparin level 0.3-0.7 units/ml Monitor platelets by anticoagulation protocol: Yes   Plan:  D/C enoxaparin No Heparin bolus Heparin drip at 1350units/hr CBC daily Heparin level at [place], Shea Stakes [place] 05/28/2019,1:30 AM

## 2019-05-28 NOTE — ED Notes (Signed)
Pt placed on hospital bed for comfort.

## 2019-05-28 NOTE — Progress Notes (Signed)
ANTICOAGULATION CONSULT NOTE - Initial Consult  Pharmacy Consult for Heparin Indication: Bilateral DVT  Allergies  Allergen Reactions  . Sulfa Antibiotics Hives    Patient Measurements: Height: 6' (182.9 cm) Weight: 168 lb 15.7 oz (76.6 kg) IBW/kg (Calculated) : 77.6 Heparin Dosing Weight:   Vital Signs: Temp: 98.6 F (37 C) (12/31 0113) Temp Source: Oral (12/31 0113) BP: 146/76 (12/31 1000) Pulse Rate: 92 (12/31 1000)  Labs: Recent Labs    05/27/19 1923 05/28/19 0940  HGB 8.9* 9.1*  HCT 27.8* 27.7*  PLT 264 268  HEPARINUNFRC  --  0.46  CREATININE 3.15* 3.01*    Estimated Creatinine Clearance: 24.8 mL/min (A) (by C-G formula based on SCr of 3.01 mg/dL (H)).   Medical History: Past Medical History:  Diagnosis Date  . Bladder cancer ([place])   . Cellulitis    Left leg   . Constipation   . Diabetes mellitus without complication ([place])   . Diabetic neuropathy ([place])    Feet  . Hematuria 04/21/2018  . Hyperlipidemia   . 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 Vasculitis ([place])    Left leg    Medications:  Infusions:  . cefTRIAXone (ROCEPHIN)  IV Stopped (05/27/19 2351)  . heparin 1,350 Units/hr (05/28/19 0224)    Assessment: Patient with + D-Dimer and r/o PE.  Enoxaparin charted 12/30 ~2215.  Preliminary doppler showed bilateral DVT and heparin infusion initiated.  Initial HL therapeutic at 0.46.  Goal of Therapy:  Heparin level 0.3-0.7 units/ml Monitor platelets by anticoagulation protocol: Yes   Plan:  Continue Heparin drip at 1350units/hr Repeat Heparin level at 1600. Monitor CBC daily, s/s bleeding  Efraim Kaufmann, PharmD, BCPS 05/28/2019,10:58 AM

## 2019-05-28 NOTE — ED Notes (Signed)
Date and time results received: 05/28/19 1000  Test: Venous doppler Critical Value: Bilateral DVT  Name of Provider Notified: Dr. [place]  Orders Received? Or Actions Taken?: acknowledged. Pt already on heparin drip.

## 2019-05-28 NOTE — ED Notes (Signed)
Report given to [place]

## 2019-05-28 NOTE — Progress Notes (Signed)
Bilateral lower extremity venous duplex has been completed. Preliminary results can be found in CV Proc through chart review.  Results were given to the patient's nurse, Abbie.  05/28/19 9:55 AM Carlos Levering RVT

## 2019-05-28 NOTE — Progress Notes (Addendum)
ANTICOAGULATION CONSULT NOTE - Initial Consult  Pharmacy Consult for Heparin Indication: Bilateral DVT  Allergies  Allergen Reactions  . Sulfa Antibiotics Hives   Patient Measurements: Height: 6' (182.9 cm) Weight: 168 lb 15.7 oz (76.6 kg) IBW/kg (Calculated) : 77.6 Heparin Dosing Weight:   Vital Signs: Temp: 101.7 F (38.7 C) (12/31 1624) Temp Source: Oral (12/31 1624) BP: 161/63 (12/31 1624) Pulse Rate: 92 (12/31 1624)  Labs: Recent Labs    05/27/19 1923 05/28/19 0940 05/28/19 1534  HGB 8.9* 9.1*  --   HCT 27.8* 27.7*  --   PLT 264 268  --   HEPARINUNFRC  --  0.46 0.18*  CREATININE 3.15* 3.01*  --     Estimated Creatinine Clearance: 24.8 mL/min (A) (by C-G formula based on SCr of 3.01 mg/dL (H)).   Medical History: Past Medical History:  Diagnosis Date  . Bladder cancer ([place])   . Cellulitis    Left leg   . Constipation   . Diabetes mellitus without complication ([place])   . Diabetic neuropathy ([place])    Feet  . Hematuria 04/21/2018  . Hyperlipidemia   . 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 Vasculitis ([place])    Left leg    Medications:  Infusions:  . sodium chloride 100 mL/hr at 05/28/19 1137  . cefTRIAXone (ROCEPHIN)  IV Stopped (05/27/19 2351)  . heparin 1,350 Units/hr (05/28/19 0224)   Assessment: Patient with + D-Dimer and r/o PE.  Enoxaparin 30mg  charted 12/30 ~2215.  Preliminary doppler showed bilateral DVT and heparin infusion begun at 1350 units/hr 12/31 at 0224, no loading dose. - SCr elevated, Cl ~ 25 ml/min  First Hep level 0940 = 0.46 units/ml, in therapeutic range 2nd Hep level 1534 = 0.18 units/ml, below desired range, no problems noted with infusion  Goal of Therapy:  Heparin level 0.3-0.7 units/ml Monitor platelets by anticoagulation protocol: Yes   Plan:   Heparin 1500 unit bolus, increase infusion to 1500 units/hr  Repeat Heparin level in 8 hr  Daily CBC, order daily Heparin level when at  steady state  Monitor for s/s bleed  Anticipate DOAC based on Oncology recommendation  Minda Ditto, PharmD 05/28/2019,4:54 PM

## 2019-05-28 NOTE — Progress Notes (Signed)
Called wife and updated on current condition. No concerns at this time.

## 2019-05-28 NOTE — ED Notes (Signed)
NM Pulmonary Perf and Vent scan can be performed 12/31 during regular hours per Dr. Marlowe Sax.

## 2019-05-28 NOTE — Consult Note (Signed)
[place] Nurse ostomy consult note Stoma type/location: RUQ urostomy  Wife assists at home. Patient may need help with new pouch application when it is time.  Current pouch intact.  Ostomy placed 11/2018 Stomal assessment/size: 1 1/4" round pink and moist  Clear yellow urine in pouch Peristomal assessment: barrier ring Treatment options for stomal/peristomal skin: 1 piece convex pouch and barrier ring Adapter/bedside drainage bag Output clear yellow urine Ostomy pouching: 1pc.convex with barrier ring Education provided: none   Enrolled patient in [place] program: Yes previous admission Will not follow at this time.  Please re-consult if needed.  Domenic Moras MSN, RN, FNP-BC CWON Wound, Ostomy, Continence Nurse Pager [phone]

## 2019-05-28 NOTE — Progress Notes (Signed)
PROGRESS NOTE    Sergio Pennington  AJG:[id number] DOB: 12/24/1950 DOA: 05/27/2019 PCP: Darreld Mclean, MD    Brief Narrative:  Sergio Pennington is a 68 y.o. male with medical history significant of bladder cancer status post urostomy, type 2 diabetes, hypertension, hyperlipidemia presenting to the ED for evaluation of generalized weakness.  Symptoms started 2 weeks ago.  He has had generalized weakness and fatigue.  Intermittent episodes of nonbloody emesis and nonbloody diarrhea.  He is having 3-5/10 intensity generalized abdominal pain and intermittent fevers at home.  He is coughing a little.  Denies shortness of breath or chest pain.  No additional history could be obtained from the patient as he started vomiting.  ED Course: Afebrile and hemodynamically stable.  Oxygen saturation 97-100% on room air. SARS-CoV-2 rapid antigen test positive.  WBC count 18.3.  Lactic acid normal.  Hemoglobin 8.9, ranging between 10.4-11 on recent labs.  Sodium 133.  BUN 67, creatinine 3.1.  Creatinine ranging between 1.6-1.9 on recent labs.  LFTs normal.  UA with large amount of leukocytes, 21-50 RBCs, greater than 50 WBCs, and few bacteria.  Urine culture pending.  Blood culture x2 pending.  C. difficile PCR and GI pathogen panel pending.  Chest x-ray showing mild right basilar atelectasis and no focal consolidation. Patient received 1.5 L normal saline boluses.   Assessment & Plan:   Principal Problem:   COVID-19 virus infection Active Problems:   UTI (urinary tract infection)   Abdominal pain, vomiting, and diarrhea   AKI (acute kidney injury) ([place])   Anemia   Viral gastroenteritis, suspect Covid-19 or related Patient is presenting with a 2-week history of intermittent nonbloody emesis and nonbloody diarrhea.  SARS-CoV-2 rapid antigen test positive.  Afebrile.  WBC count elevated at 18.3.  Lactic acid normal.  No signs of sepsis.  LFTs normal.    CT abdomen/pelvis with mild  to moderate hydronephrosis; left appears stable, slightly worsened on right since 05/12/2019, small right pleural effusion, small perihepatic and perisplenic ascites consistent with metastatic disease.  C. Difficile negative. --WBC 18.3-->17.3 --GI PCR panel: Pending --Blood cultures x2: Pending --IVF with NS at 166mL/hr --Clear liquid diet, advance as tolerates --Supportive care with antiemetics, Zofran/Phenergan  Acute renal failure Bilateral hydronephrosis R>L Creatinine on admission 3.15 with a baseline 1.2-1.5 for the past year.  Etiology likely secondary to volume loss with acute gastroenteritis/dehydration versus concern for urinary obstruction with fibrosis.  Recent bladder resection with ileostomy by urology Dr. Tresa Moore August 2020. --Urology following, appreciate assistance; recommend hydration to see if any improvement in renal function prior to further discussion of possible percutaneous nephrostomy --IV fluid w/ NS at 168mL/hr --Avoid nephrotoxins, renally dose all medications --Strict I's and O's --Monitor renal function daily  Urinary tract infection History of bladder cancer status post urostomy.  Afebrile.  WBC count elevated at 18.3. Urinalysis with large leukoesterase, negative nitrite, few bacteria and greater than 50 WBCs. --Urine culture: Pending --Continue antibiotics with empiric ceftriaxone --Await culture results --wound ostomy consult for continued urine ostomy care  Acute bilateral lower extremity DVT Vascular bilat lower extremity ultrasound remarkable for acute bilateral DVT to right common femoral, right femoral, left common femoral, left femoral, left popliteal, left posterior tibial veins and left gastrocnemius vein. --Continue heparin drip for now-->can transition to NOAC (Eliquis/Xarelto) per Oncology once off drip  COVID-19 infection SARS-CoV-2 rapid antigen test positive.    Patient without hypoxia and chest x-ray showing mild right basilar atelectasis  and no focal consolidation.  D-dimer greater than 20, ferritin 586, LDH 150, CRP 20.3.   --Will hold off treatment with steroids, remdesivir or Actemra --Continue to monitor respiratory status closely, currently on room air --Vitamin C, zinc --Airborne/contact isolation precautions  Physical deconditioning Suspect related to UTI and acute viral illness. --Management as mentioned above --PT evaluation  Hx Bladder cancer Follows with medical oncology, Dr. Alen Blew. S/P cystectomy with ileal conduit diversion on 01/15/2019 by Dr. Tresa Moore with urology.  CT abdomen/pelvis with possible progression of metastatic disease process.  Discussed with Dr. Alen Blew; he will follow patient up closely following discharge for further recommendations.  Normocytic anemia Hemoglobin 8.9 and MCV 92.4.  Hemoglobin was ranging between 10.4-11 on recent labs.  No signs of active bleeding. FOBT negative.  --FOBT pending -Check iron, ferritin, TIBC  Mild hyponatremia Suspect related to GI losses from emesis/diarrhea and decreased p.o. intake.  Sodium 133. --IV fluid hydration --Continue to monitor sodium level daily  Noninsulin-dependent type 2 diabetes Hemoglobin A1c 6.4. --Sensitive sliding scale insulin for coverage  --ACHS and CBG checks.  DVT prophylaxis: Heparin drip Code Status: Full Code Family Communication: Updated patient extensively at bedside Disposition Plan: Continue inpatient, heparin drip, IV antibiotics, further depending on clinical course, anticipate discharge home when medically ready   Consultants:   Urology - Dr. Claudia Desanctis  Oncology - Dr. Alen Blew; discussed plan of care over telephone on 05/28/2019  Procedures:   None  Antimicrobials:   Ceftriaxone   Subjective: Patient seen and examined at bedside, in ED holding area.  Continues with mild diarrhea, improved since presentation.  Also continues with significant weakness/fatigue and reports progressive lower extremity edema  over the past few weeks; left greater than right.  No other specific complaints or concerns at this time.  Denies headache, no visual changes, no chest pain, no palpitations, no shortness of breath, no cough/congestion, no abdominal pain, no paresthesias.  No acute events overnight per nursing staff.  Objective: Vitals:   05/28/19 0700 05/28/19 0800 05/28/19 0900 05/28/19 1000  BP: 139/64 (!) 134/59 136/64 (!) 146/76  Pulse: 87 85 83 92  Resp: (!) 27 16 19 17   Temp:      TempSrc:      SpO2: 98% 98% 98% 97%  Weight:      Height:        Intake/Output Summary (Last 24 hours) at 05/28/2019 1117 Last data filed at 05/27/2019 2351 Gross per 24 hour  Intake 1600 ml  Output --  Net 1600 ml   Filed Weights   05/27/19 1846  Weight: 76.6 kg    Examination:  General exam: Appears calm and comfortable  Respiratory system: Clear to auscultation. Respiratory effort normal.  Oxygenating well on room air Cardiovascular system: S1 & S2 heard, RRR. No JVD, murmurs, rubs, gallops or clicks. No pedal edema. Gastrointestinal system: Abdomen is nondistended, soft, mild generalized tenderness, no rebound/guarding/masses. No organomegaly.  Slightly hyperactive bowel sounds.   GU: Urostomy noted with cloudy urine with sediment Central nervous system: Alert and oriented. No focal neurological deficits. Extremities: Symmetric 5 x 5 power. Skin: No rashes, lesions or ulcers Psychiatry: Judgement and insight appear normal. Mood & affect appropriate.     Data Reviewed: I have personally reviewed following labs and imaging studies  CBC: Recent Labs  Lab 05/27/19 1923 05/28/19 0940  WBC 18.3* 17.3*  NEUTROABS  --  14.1*  HGB 8.9* 9.1*  HCT 27.8* 27.7*  MCV 92.4 93.9  PLT 264 341   Basic Metabolic Panel: Recent Labs  Lab 05/27/19 1923 05/28/19 0940  NA 132* 134*  K 5.1 4.7  CL 96* 100  CO2 23 20*  GLUCOSE 151* 133*  BUN 67* 68*  CREATININE 3.15* 3.01*  CALCIUM 8.8* 8.3*    GFR: Estimated Creatinine Clearance: 24.8 mL/min (A) (by C-G formula based on SCr of 3.01 mg/dL (H)). Liver Function Tests: Recent Labs  Lab 05/27/19 1923  AST 19  ALT 22  ALKPHOS 72  BILITOT 0.5  PROT 6.4*  ALBUMIN 2.8*   No results for input(s): LIPASE, AMYLASE in the last 168 hours. No results for input(s): AMMONIA in the last 168 hours. Coagulation Profile: No results for input(s): INR, PROTIME in the last 168 hours. Cardiac Enzymes: No results for input(s): CKTOTAL, CKMB, CKMBINDEX, TROPONINI in the last 168 hours. BNP (last 3 results) No results for input(s): PROBNP in the last 8760 hours. HbA1C: Recent Labs    05/27/19 1920  HGBA1C 6.4*   CBG: Recent Labs  Lab 05/27/19 2223 05/28/19 0823  GLUCAP 135* 132*   Lipid Profile: No results for input(s): CHOL, HDL, LDLCALC, TRIG, CHOLHDL, LDLDIRECT in the last 72 hours. Thyroid Function Tests: No results for input(s): TSH, T4TOTAL, FREET4, T3FREE, THYROIDAB in the last 72 hours. Anemia Panel: Recent Labs    05/27/19 1920  GXQJJHER 740*   Sepsis Labs: Recent Labs  Lab 05/27/19 1920 05/27/19 1923  PROCALCITON 0.96  --   LATICACIDVEN  --  0.9    Recent Results (from the past 240 hour(s))  Blood Culture x 1     Status: None (Preliminary result)   Collection Time: 05/27/19  7:23 PM   Specimen: BLOOD RIGHT FOREARM  Result Value Ref Range Status   Specimen Description   Final    BLOOD RIGHT FOREARM BOTTLES DRAWN AEROBIC AND ANAEROBIC Blood Culture results may not be optimal due to an inadequate volume of blood received in culture bottles Performed at [facility], [place] [address] [place], [place] [address]    Special Requests   Final    NONE Performed at [facility], [place] [address] [place], [place] [address]    Culture   Final    NO GROWTH < 12 HOURS Performed at [place] [address] [place], [place] [address]    Report Status PENDING  Incomplete   C Difficile Quick Screen w PCR reflex     Status: None   Collection Time: 05/27/19  9:32 PM   Specimen: Stool  Result Value Ref Range Status   C Diff antigen NEGATIVE NEGATIVE Final   C Diff toxin NEGATIVE NEGATIVE Final   C Diff interpretation No C. difficile detected.  Final    Comment: Performed at [facility], [place] [address] [place],  [address]         Radiology Studies: CT ABDOMEN PELVIS WO CONTRAST  Result Date: 05/27/2019 CLINICAL DATA:  Diarrhea nausea and vomiting history of bladder cancer status post urostomy EXAM: CT ABDOMEN AND PELVIS WITHOUT CONTRAST TECHNIQUE: Multidetector CT imaging of the abdomen and pelvis was performed following the standard protocol without IV contrast. COMPARISON:  05/12/2019, 10/23/2018 FINDINGS: Lower chest: Lung bases demonstrate new small right-sided pleural effusion. Scattered areas of subpleural fibrosis. Punctate and nodular calcifications at the right lung base as before. Heart size is normal. Coronary vascular calcification. Hepatobiliary: Probable focal fat infiltration near falciform ligament. No calcified gallstone or biliary dilatation Pancreas: Unremarkable. No pancreatic ductal dilatation or surrounding inflammatory changes. Spleen: Normal in  size without focal abnormality. Adrenals/Urinary Tract: Adrenal glands are normal. Complex cystic lesion exophytic to the lower pole right kidney with scattered calcifications, measures up to 5.1 cm. Mild to moderate bilateral hydronephrosis and hydroureter appears slightly worse on the right side compared to most recent prior CT. Hydroureter visualized to ill-defined soft tissue density in the right lower quadrant of the abdomen with surgical suture. Stomach/Bowel: The stomach is nonenlarged. There is no dilated small bowel. Right lower quadrant ostomy. Negative appendix. No colon wall thickening. Vascular/Lymphatic: Nonaneurysmal aorta. Aortic atherosclerosis, moderate.  Continued retroperitoneal adenopathy. Left external iliac mass/adenopathy measuring 3.6 cm, grossly unchanged. Pelvic sidewall adenopathy measuring up to 18 mm on the left. Reproductive: Prostate surgically absent. Other: No free air. Small amount of ascites adjacent to the spleen and liver. Ill-defined soft tissue mass in the left lower quadrant/upper pelvis measuring 3.3 x 2.1 cm concerning for metastatic deposit. Irregular soft tissue density within the pelvis measuring approximately 6.8 x 1.6 cm, grossly similar compared to prior. Ill-defined soft tissue thickening and nodularity within the pelvis. Small amount of fluid and ill-defined soft tissue density in the right gutter. Multiple nodules or nodes in the lower abdomen and pelvis. Musculoskeletal: No new abnormality since 05/12/2019. Mild irregular lucency at the S3 segment of the sacrum. IMPRESSION: 1. Status post bladder resection with right lower quadrant urostomy. Mild to moderate bilateral hydronephrosis, grossly stable on the left and slightly worsened on the right since 05/12/2019. 2. New small right pleural effusion and new small amount of perihepatic and perisplenic ascites. 3. Grossly stable retroperitoneal, pelvic sidewall, and iliac adenopathy consistent with metastatic disease. Numerous pelvic nodules. Slight increased fluid and nodularity in the right gutter with persistent ill-defined soft tissue mass in the left pelvis, all consistent with metastatic disease. Grossly stable lobulated soft tissue density in the pelvis concerning for residual or recurrent disease. Irregular soft tissue density in the right lower quadrant with associated surgical sutures presumably related to history of urostomy surgery, though with slightly masslike features; additional metastatic disease cannot be excluded. Electronically Signed   By: Donavan Foil M.D.   On: 05/27/2019 23:04   XR Chest Single View  Result Date: 05/27/2019 CLINICAL DATA:  Weakness, nausea,  vomiting, diarrhea EXAM: PORTABLE CHEST 1 VIEW COMPARISON:  05/12/2019 FINDINGS: There is a right-sided Port-A-Cath. There is right basilar atelectasis. There is no focal consolidation. There is no pleural effusion or pneumothorax. The heart and mediastinal contours are unremarkable. There is no acute osseous abnormality. IMPRESSION: Mild right basilar atelectasis. Otherwise no acute cardiopulmonary disease. Electronically Signed   By: Kathreen Devoid   On: 05/27/2019 19:49   VAS [place] LOWER EXTREMITY VENOUS (DVT)  Result Date: 05/28/2019  Lower Venous Study Indications: Elevated Ddimer.  Risk Factors: COVID 19 positive Cancer. Anticoagulation: Heparin. Limitations: Bowel gas. Comparison Study: No prior studies. Performing Technologist: Oliver Hum RVT  Examination Guidelines: A complete evaluation includes B-mode imaging, spectral Doppler, color Doppler, and power Doppler as needed of all accessible portions of each vessel. Bilateral testing is considered an integral part of a complete examination. Limited examinations for reoccurring indications may be performed as noted.  +---------+---------------+---------+-----------+----------+--------------+ RIGHT    CompressibilityPhasicitySpontaneityPropertiesThrombus Aging +---------+---------------+---------+-----------+----------+--------------+ CFV      Partial        No       No                   Acute          +---------+---------------+---------+-----------+----------+--------------+ SFJ  Full                                                        +---------+---------------+---------+-----------+----------+--------------+ FV Prox  Partial        No       No                   Acute          +---------+---------------+---------+-----------+----------+--------------+ FV Mid   Partial        No       No                   Acute          +---------+---------------+---------+-----------+----------+--------------+ FV DistalPartial         No       No                   Acute          +---------+---------------+---------+-----------+----------+--------------+ PFV                                                   Not visualized +---------+---------------+---------+-----------+----------+--------------+ POP      Full           No       No                                  +---------+---------------+---------+-----------+----------+--------------+ PTV      Full                                                        +---------+---------------+---------+-----------+----------+--------------+ PERO     Full                                                        +---------+---------------+---------+-----------+----------+--------------+ EIV      Full           No       No                                  +---------+---------------+---------+-----------+----------+--------------+ CIV                                                   Not visualized +---------+---------------+---------+-----------+----------+--------------+   +---------+---------------+---------+-----------+----------+--------------+ LEFT     CompressibilityPhasicitySpontaneityPropertiesThrombus Aging +---------+---------------+---------+-----------+----------+--------------+ CFV      Partial        No       No  Acute          +---------+---------------+---------+-----------+----------+--------------+ SFJ      Full                                                        +---------+---------------+---------+-----------+----------+--------------+ FV Prox  Full                                                        +---------+---------------+---------+-----------+----------+--------------+ FV Mid   Partial        No       No                   Acute          +---------+---------------+---------+-----------+----------+--------------+ FV DistalPartial        No       No                   Acute           +---------+---------------+---------+-----------+----------+--------------+ PFV                                                   Not visualized +---------+---------------+---------+-----------+----------+--------------+ POP      Partial        No       No                   Acute          +---------+---------------+---------+-----------+----------+--------------+ PTV      Partial                                      Acute          +---------+---------------+---------+-----------+----------+--------------+ PERO     Full                                                        +---------+---------------+---------+-----------+----------+--------------+ Gastroc  None                                         Acute          +---------+---------------+---------+-----------+----------+--------------+ SSV      Full                                                        +---------+---------------+---------+-----------+----------+--------------+ EIV      Full           No       No                                  +---------+---------------+---------+-----------+----------+--------------+  CIV                                                   Not visualized +---------+---------------+---------+-----------+----------+--------------+   Summary: Right: Findings consistent with acute deep vein thrombosis involving the right common femoral vein, and right femoral vein. No cystic structure found in the popliteal fossa. The external iliac vein appears patent. Left: Findings consistent with acute deep vein thrombosis involving the left common femoral vein, left femoral vein, left popliteal vein, left posterior tibial veins, and left gastrocnemius veins. No cystic structure found in the popliteal fossa. The external iliac vein appears patent.  *See table(s) above for measurements and observations.    Preliminary         Scheduled Meds: . vitamin C  500 mg Oral BID  .  cholecalciferol  2,000 Units Oral Daily  . insulin aspart  0-5 Units Subcutaneous QHS  . insulin aspart  0-9 Units Subcutaneous TID WC  . zinc sulfate  220 mg Oral Daily   Continuous Infusions: . cefTRIAXone (ROCEPHIN)  IV Stopped (05/27/19 2351)  . heparin 1,350 Units/hr (05/28/19 0224)     LOS: 1 day    Time spent: 39 minutes spent on chart review, discussion with nursing staff, consultants, updating family and interview/physical exam; more than 50% of that time was spent in counseling and/or coordination of care.    Preciliano Castell J [place] DO [facility] 05/28/2019, 11:17 AM

## 2019-05-29 LAB — BASIC METABOLIC PANEL
Anion gap: 12 (ref 5–15)
BUN: 60 mg/dL — ABNORMAL HIGH (ref 8–23)
CO2: 20 mmol/L — ABNORMAL LOW (ref 22–32)
Calcium: 8.5 mg/dL — ABNORMAL LOW (ref 8.9–10.3)
Chloride: 104 mmol/L (ref 98–111)
Creatinine, Ser: 3.02 mg/dL — ABNORMAL HIGH (ref 0.61–1.24)
GFR calc Af Amer: 23 mL/min — ABNORMAL LOW (ref >60)
GFR calc non Af Amer: 20 mL/min — ABNORMAL LOW (ref >60)
Glucose, Bld: 117 mg/dL — ABNORMAL HIGH (ref 70–99)
Potassium: 4.5 mmol/L (ref 3.5–5.1)
Sodium: 136 mmol/L (ref 135–145)

## 2019-05-29 LAB — CBC
HCT: 28.4 % — ABNORMAL LOW (ref 39.0–52.0)
Hemoglobin: 9 g/dL — ABNORMAL LOW (ref 13.0–17.0)
MCH: 30 pg (ref 26.0–34.0)
MCHC: 31.7 g/dL (ref 30.0–36.0)
MCV: 94.7 fL (ref 80.0–100.0)
Platelets: 311 10*3/uL (ref 150–400)
RBC: 3 MIL/uL — ABNORMAL LOW (ref 4.22–5.81)
RDW: 13.9 % (ref 11.5–15.5)
WBC: 15.7 10*3/uL — ABNORMAL HIGH (ref 4.0–10.5)
nRBC: 0 % (ref 0.0–0.2)

## 2019-05-29 LAB — GLUCOSE, CAPILLARY
Glucose-Capillary: 150 mg/dL — ABNORMAL HIGH (ref 70–99)
Glucose-Capillary: 143 mg/dL — ABNORMAL HIGH (ref 70–99)
Glucose-Capillary: 152 mg/dL — ABNORMAL HIGH (ref 70–99)
Glucose-Capillary: 166 mg/dL — ABNORMAL HIGH (ref 70–99)

## 2019-05-29 LAB — HEPARIN LEVEL (UNFRACTIONATED)
Heparin Unfractionated: 0.34 IU/mL (ref 0.30–0.70)
Heparin Unfractionated: 0.41 IU/mL (ref 0.30–0.70)

## 2019-05-29 LAB — MAGNESIUM: Magnesium: 2 mg/dL (ref 1.7–2.4)

## 2019-05-29 NOTE — Progress Notes (Signed)
Chart review of patient urine output and lab work shows 1650 mL urine from ileal conduit.  Creatinine still elevated at 3.02 (3.15 on admission).  Kidney function is decreased but has not continued to decline.  Would continue to see what UOP and creatinine do.  If no improvement in creatinine tomorrow would repeat renal US to assess hydro.  AKI likely multi-factorial at this point. Urology following along.

## 2019-05-29 NOTE — Progress Notes (Signed)
Called lab, phlebotomist states that he will be up shortly to obtain an unfractionated heparin level for this patient.

## 2019-05-29 NOTE — Progress Notes (Signed)
ANTICOAGULATION CONSULT NOTE - Follow Up Consult  Pharmacy Consult for Heparin Indication: DVT  Allergies  Allergen Reactions  . Sulfa Antibiotics Hives    Patient Measurements: Height: 6' (182.9 cm) Weight: 168 lb 15.7 oz (76.6 kg) IBW/kg (Calculated) : 77.6 Heparin Dosing Weight:   Vital Signs: Temp: 98.7 F (37.1 C) (01/01 0553) BP: 147/71 (01/01 0553) Pulse Rate: 83 (01/01 0553)  Labs: Recent Labs    05/27/19 1923 05/28/19 0940 05/28/19 1534 05/29/19 0248 05/29/19 0517  HGB 8.9* 9.1*  --  9.0*  --   HCT 27.8* 27.7*  --  28.4*  --   PLT 264 268  --  311  --   HEPARINUNFRC  --  0.46 0.18*  --  0.34  CREATININE 3.15* 3.01*  --  3.02*  --     Estimated Creatinine Clearance: 24.7 mL/min (A) (by C-G formula based on SCr of 3.02 mg/dL (H)).   Medications:  Infusions:  . sodium chloride 100 mL/hr at 05/29/19 0504  . cefTRIAXone (ROCEPHIN)  IV 1 g (05/28/19 2204)  . heparin 1,500 Units/hr (05/28/19 2006)    Assessment: Patient with heparin level at goal.  No heparin issues per RN. Heparin level is at lower end of goal.  Goal of Therapy:  Heparin level 0.3-0.7 units/ml Monitor platelets by anticoagulation protocol: Yes   Plan:  Increase heparin to 1600 units/hr Recheck level at [address] [place] 05/29/2019,6:05 AM

## 2019-05-29 NOTE — Progress Notes (Signed)
Sergio Pennington for Heparin Indication: Bilateral DVT  Allergies  Allergen Reactions  . Sulfa Antibiotics Hives   Patient Measurements: Height: 6' (182.9 cm) Weight: 168 lb 15.7 oz (76.6 kg) IBW/kg (Calculated) : 77.6 Heparin Dosing Weight: TBW  Vital Signs: Temp: 98.7 F (37.1 C) (01/01 1145) BP: 155/74 (01/01 1145) Pulse Rate: 86 (01/01 1145)  Labs: Recent Labs    05/27/19 1923 05/27/19 1923 05/28/19 0940 05/28/19 1534 05/29/19 0248 05/29/19 0517 05/29/19 1415  HGB 8.9*  --  9.1*  --  9.0*  --   --   HCT 27.8*  --  27.7*  --  28.4*  --   --   PLT 264  --  268  --  311  --   --   HEPARINUNFRC  --    < > 0.46 0.18*  --  0.34 0.41  CREATININE 3.15*  --  3.01*  --  3.02*  --   --    < > = values in this interval not displayed.    Estimated Creatinine Clearance: 24.7 mL/min (A) (by C-G formula based on SCr of 3.02 mg/dL (H)).  Medications:  Infusions:  . sodium chloride 100 mL/hr at 05/29/19 0504  . cefTRIAXone (ROCEPHIN)  IV 1 g (05/28/19 2204)  . heparin 1,600 Units/hr (05/29/19 1312)   Assessment: 55 yoM with metastatic bladder cancer admitted with COVID gastroenteritis and AKI. D-dimer elevated and found to have bilateral DVT. Pharmacy requested to dose IV heparin infusion.   Baseline INR, aPTT: not done  Prior anticoagulation: enoxaparin 30 mg daily ppx; last dose 12/30 at 2216  Significant events:  Today, 05/29/2019:  CBC: Hgb low but stable; Plt stable WNL  Confirmatory heparin level remains therapeutic on 1600 units/hr  No bleeding or infusion issues per nursing  Goal of Therapy: Heparin level 0.3-0.7 units/ml Monitor platelets by anticoagulation protocol: Yes  Plan:  Continue heparin IV infusion at 1600 units/hr  Daily CBC and heparin level  Monitor for signs of bleeding or thrombosis  DOAC acceptable for long term treatment per Oncologist, but will need to ensure no need for nephrostomy placement or other  procedures first  Reuel Boom, PharmD, BCPS [phone] 05/29/2019, 4:19 PM

## 2019-05-29 NOTE — Evaluation (Signed)
Physical Therapy Evaluation Patient Details Name: Sergio Pennington MRN: [id number] DOB: 1950-12-21 Today's Date: 05/29/2019   History of Present Illness  Sergio Pennington is a 69 y.o. male with medical history significant of bladder cancer status post urostomy, type 2 diabetes, hypertension, hyperlipidemia presenting to the ED for evaluation of generalized weakness.  Symptoms started 2 weeks ago.  He has had generalized weakness and fatigue.  Intermittent episodes of nonbloody emesis and nonbloody diarrhea.  He is having 3-5/10 intensity generalized abdominal pain and intermittent fevers at home.  He is coughing a little.  Denies shortness of breath or chest pain.  No additional history could be obtained from the patient as he started vomiting.    Clinical Impression  Sergio Pennington is 69 y.o. male admitted with above HPI and diagnosis. Patient is currently limited by functional impairments below (see PT problem list). Patient lives with his wife and is independent at baseline but recently has been using [facility] and rollator due to weakness. Patient will benefit from continued skilled PT interventions to address impairments and progress independence with mobility, recommending HHPT and RW. Acute PT will follow and progress as able.     Follow Up Recommendations Home health PT    Equipment Recommendations  Rolling walker with 5" wheels    Recommendations for Other Services       Precautions / Restrictions Precautions Precautions: Fall Restrictions Weight Bearing Restrictions: No      Mobility  Bed Mobility Overal bed mobility: Needs Assistance Bed Mobility: Supine to Sit;Sit to Supine     Supine to sit: Supervision;HOB elevated Sit to supine: Supervision;HOB elevated   General bed mobility comments: no assist or cues requried, pt requried extra time  Transfers Overall transfer level: Needs assistance Equipment used: Rolling walker (2  wheeled) Transfers: Sit to/from Stand Sit to Stand: Min guard         General transfer comment: cues for safe hand placement on RW, no assist required for power up or to steady  Ambulation/Gait Ambulation/Gait assistance: Min guard Gait Distance (Feet): 40 Feet(1x10, 1x30) Assistive device: Rolling walker (2 wheeled) Gait Pattern/deviations: Step-through pattern;Decreased stride length Gait velocity: slow   General Gait Details: pt maintained safe hand placement and proximity to RW, short bout of gait to bathroom and then pt walked for 3.5 laps in hospital room, no overt LOB and pt managed walker safely during turns  Stairs            Wheelchair Mobility    Modified Rankin (Stroke Patients Only)       Balance Overall balance assessment: Needs assistance Sitting-balance support: Feet supported Sitting balance-Leahy Scale: Good     Standing balance support: During functional activity;No upper extremity supported;Bilateral upper extremity supported Standing balance-Leahy Scale: Fair   Single Leg Stance - Right Leg: 3 Single Leg Stance - Left Leg: 25 Tandem Stance - Right Leg: 16            Pertinent Vitals/Pain Pain Assessment: No/denies pain    Home Living Family/patient expects to be discharged to:: Private residence Living Arrangements: Spouse/significant other Available Help at Discharge: Family;Available 24 hours/day Type of Home: House Home Access: Stairs to enter Entrance Stairs-Rails: Can reach both;Left;Right Entrance Stairs-Number of Steps: 8 Home Layout: One level Home Equipment: Walker - 4 wheels;Cane - single point;Shower seat;Grab bars - toilet;Grab bars - tub/shower;Shower seat - built in Additional Comments: py prefers RW but does not have one at home    Prior Function Level of  Independence: Independent with assistive device(s)         Comments: Has been using cane occasionally and more recently as he became more week. Used to use  cane and rollator during chemo >6 months ago.     Hand Dominance   Dominant Hand: Right    Extremity/Trunk Assessment   Upper Extremity Assessment Upper Extremity Assessment: Overall WFL for tasks assessed    Lower Extremity Assessment Lower Extremity Assessment: Overall WFL for tasks assessed    Cervical / Trunk Assessment Cervical / Trunk Assessment: Normal  Communication   Communication: No difficulties  Cognition Arousal/Alertness: Awake/alert Behavior During Therapy: WFL for tasks assessed/performed Overall Cognitive Status: Within Functional Limits for tasks assessed       General Comments General comments (skin integrity, edema, etc.): 5x Sit<>Stand Test: pt performed 2 times (with UE use: 16.2 seconds) (without UE use 18.6 seconds). no assist required, no unsteadiness noted with rising    Exercises     Assessment/Plan    PT Assessment Patient needs continued PT services  PT Problem List Decreased strength;Decreased activity tolerance;Decreased balance;Decreased mobility;Decreased knowledge of use of DME       PT Treatment Interventions DME instruction;Functional mobility training;Balance training;Patient/family education;Therapeutic activities;Gait training;Stair training;Therapeutic exercise    PT Goals (Current goals can be found in the Care Plan section)  Acute Rehab PT Goals Patient Stated Goal: get back to ambulating with no device PT Goal Formulation: With patient Time For Goal Achievement: 06/12/19 Potential to Achieve Goals: Good    Frequency Min 3X/week    AM-PAC PT "6 Clicks" Mobility  Outcome Measure Help needed turning from your back to your side while in a flat bed without using bedrails?: A Little Help needed moving from lying on your back to sitting on the side of a flat bed without using bedrails?: A Little Help needed moving to and from a bed to a chair (including a wheelchair)?: A Little Help needed standing up from a chair using your  arms (e.g., wheelchair or bedside chair)?: A Little Help needed to walk in hospital room?: A Little Help needed climbing 3-5 steps with a railing? : A Little 6 Click Score: 18    End of Session Equipment Utilized During Treatment: Gait belt Activity Tolerance: Patient tolerated treatment well Patient left: in bed;with call bell/phone within reach;with bed alarm set Nurse Communication: Mobility status PT Visit Diagnosis: Muscle weakness (generalized) (M62.81);Difficulty in walking, not elsewhere classified (R26.2)    Time: 5397-6734 PT Time Calculation (min) (ACUTE ONLY): 32 min   Charges:   PT Evaluation $PT Eval Low Complexity: 1 Low PT Treatments $Therapeutic Activity: 8-22 mins       Gwynneth Albright PT, DPT Physical Therapist with [facility]  05/29/2019 1:19 PM

## 2019-05-29 NOTE — Progress Notes (Signed)
PROGRESS NOTE    Sergio Pennington  QMV:[id number] DOB: 1951/03/16 DOA: 05/27/2019 PCP: Darreld Mclean, MD    Brief Narrative:  Sergio Pennington is a 69 y.o. male with medical history significant of bladder cancer status post urostomy, type 2 diabetes, hypertension, hyperlipidemia presenting to the ED for evaluation of generalized weakness.  Symptoms started 2 weeks ago.  He has had generalized weakness and fatigue.  Intermittent episodes of nonbloody emesis and nonbloody diarrhea.  He is having 3-5/10 intensity generalized abdominal pain and intermittent fevers at home.  He is coughing a little.  Denies shortness of breath or chest pain.  No additional history could be obtained from the patient as he started vomiting.  ED Course: Afebrile and hemodynamically stable.  Oxygen saturation 97-100% on room air. SARS-CoV-2 rapid antigen test positive.  WBC count 18.3.  Lactic acid normal.  Hemoglobin 8.9, ranging between 10.4-11 on recent labs.  Sodium 133.  BUN 67, creatinine 3.1.  Creatinine ranging between 1.6-1.9 on recent labs.  LFTs normal.  UA with large amount of leukocytes, 21-50 RBCs, greater than 50 WBCs, and few bacteria.  Urine culture pending.  Blood culture x2 pending.  C. difficile PCR and GI pathogen panel pending.  Chest x-ray showing mild right basilar atelectasis and no focal consolidation. Patient received 1.5 L normal saline boluses.   Assessment & Plan:   Principal Problem:   COVID-19 virus infection Active Problems:   UTI (urinary tract infection)   Abdominal pain, vomiting, and diarrhea   AKI (acute kidney injury) ([place])   Anemia   Viral gastroenteritis, suspect Covid-19 or related Patient is presenting with a 2-week history of intermittent nonbloody emesis and nonbloody diarrhea.  SARS-CoV-2 rapid antigen test positive.  Afebrile.  WBC count elevated at 18.3.  Lactic acid normal.  No signs of sepsis.  LFTs normal.    CT abdomen/pelvis with mild  to moderate hydronephrosis; left appears stable, slightly worsened on right since 05/12/2019, small right pleural effusion, small perihepatic and perisplenic ascites consistent with metastatic disease.  C. Difficile negative. --WBC 18.3-->17.3-->15.7 --GI PCR panel: Pending --Blood cultures x2: No growth <24h --IVF with NS at 100 mL/hr --Clear liquid diet, advance as tolerates --Supportive care with antiemetics, Zofran/Phenergan  Acute renal failure Bilateral hydronephrosis R>L Creatinine on admission 3.15 with a baseline 1.2-1.5 for the past year.  Etiology likely secondary to volume loss with acute gastroenteritis/dehydration versus concern for urinary obstruction with fibrosis.  Recent bladder resection with ileostomy by urology Dr. Tresa Moore August 2020. --Urology following, appreciate assistance; recommend continued hydration to see if any improvement in renal function; if no significant change tomorrow, will plan repeat renal ultrasound to assess hydronephrosis and may possibly need percutaneous nephrostomy --Cr 3.15-->3.02 --UOP 1,627mL past 24h --continue IVF w/ NS at 125 mL/hr --Avoid nephrotoxins, renally dose all medications --Strict I's and O's --Monitor renal function daily  Proteus/Klebsiella PNA Urinary tract infection History of bladder cancer status post urostomy.  Afebrile.  WBC count elevated at 18.3. Urinalysis with large leukoesterase, negative nitrite, few bacteria and greater than 50 WBCs. --Continue antibiotics with ceftriaxone --wound ostomy consult for continued urine ostomy care  Acute bilateral lower extremity DVT Vascular bilat lower extremity ultrasound remarkable for acute bilateral DVT to right common femoral, right femoral, left common femoral, left femoral, left popliteal, left posterior tibial veins and left gastrocnemius vein. --Continue heparin drip for now-->can transition to NOAC (Eliquis/Xarelto) per Oncology once off drip; will continue for now to ensure  patient does not need  percutaneous nephrostomy placement  COVID-19 infection SARS-CoV-2 rapid antigen test positive.    Patient without hypoxia and chest x-ray showing mild right basilar atelectasis and no focal consolidation.  D-dimer greater than 20, ferritin 586, LDH 150, CRP 20.3.   --Will hold off treatment with steroids, remdesivir or Actemra --Continue to monitor respiratory status closely, currently on room air --Vitamin C, zinc --Airborne/contact isolation precautions  Physical deconditioning Suspect related to UTI and acute viral illness. --Management as mentioned above --PT evaluation  Hx Bladder cancer Follows with medical oncology, Dr. Alen Blew. S/P cystectomy with ileal conduit diversion on 01/15/2019 by Dr. Tresa Moore with urology.  CT abdomen/pelvis with possible progression of metastatic disease process.  Discussed with Dr. Alen Blew; he will follow patient up closely following discharge for further recommendations.  Normocytic anemia Hemoglobin 8.9 and MCV 92.4.  Hemoglobin was ranging between 10.4-11 on recent labs.  No signs of active bleeding. FOBT negative.  --FOBT pending -Check iron, ferritin, TIBC  Mild hyponatremia Suspect related to GI losses from emesis/diarrhea and decreased p.o. intake.  Sodium 133. --Na 133-->136 --IV fluid hydration --Continue to monitor sodium level daily  Noninsulin-dependent type 2 diabetes Hemoglobin A1c 6.4. --Sensitive sliding scale insulin for coverage  --ACHS and CBG checks.  DVT prophylaxis: Heparin drip Code Status: Full Code Family Communication: Updated patient extensively at bedside Disposition Plan: Continue inpatient, heparin drip, IV antibiotics, further depending on clinical course, anticipate discharge home when medically ready   Consultants:   Urology - Dr. Claudia Desanctis  Oncology - Dr. Alen Blew; discussed plan of care over telephone on 05/28/2019  Procedures:   None  Antimicrobials:    Ceftriaxone   Subjective: Patient seen and examined at bedside, sleeping but easily arousable.  Reports diarrhea improved.  Reports continued weakness/fatigue.  No other specific complaints or concerns at this time.  Denies headache, no visual changes, no chest pain, no palpitations, no shortness of breath, no cough/congestion, no abdominal pain, no paresthesias.  No acute events overnight per nursing staff.  Objective: Vitals:   05/28/19 1624 05/28/19 1655 05/28/19 2145 05/29/19 0553  BP: (!) 161/63 (!) 154/60 (!) 146/67 (!) 147/71  Pulse: 92 88 89 83  Resp: 19 18 20 18   Temp: (!) 101.7 F (38.7 C) 100.3 F (37.9 C) 98.7 F (37.1 C) 98.7 F (37.1 C)  TempSrc: Oral     SpO2: 94% 97% 95% 96%  Weight:      Height:        Intake/Output Summary (Last 24 hours) at 05/29/2019 1115 Last data filed at 05/29/2019 0600 Gross per 24 hour  Intake 2827.74 ml  Output 1650 ml  Net 1177.74 ml   Filed Weights   05/27/19 1846  Weight: 76.6 kg    Examination:  General exam: Appears calm and comfortable  Respiratory system: Clear to auscultation. Respiratory effort normal.  Oxygenating well on room air Cardiovascular system: S1 & S2 heard, RRR. No JVD, murmurs, rubs, gallops or clicks. No pedal edema. Gastrointestinal system: Abdomen is nondistended, soft, mild generalized tenderness, no rebound/guarding/masses. No organomegaly.  Slightly hyperactive bowel sounds.   GU: Urostomy noted; cloudy urine with sediment Central nervous system: Alert and oriented. No focal neurological deficits. Extremities: Symmetric 5 x 5 power. Skin: No rashes, lesions or ulcers Psychiatry: Judgement and insight appear normal. Mood & affect appropriate.     Data Reviewed: I have personally reviewed following labs and imaging studies  CBC: Recent Labs  Lab 05/27/19 1923 05/28/19 0940 05/29/19 0248  WBC 18.3* 17.3* 15.7*  NEUTROABS  --  14.1*  --   HGB 8.9* 9.1* 9.0*  HCT 27.8* 27.7* 28.4*  MCV 92.4  93.9 94.7  PLT 264 268 103   Basic Metabolic Panel: Recent Labs  Lab 05/27/19 1923 05/28/19 0940 05/29/19 0248  NA 132* 134* 136  K 5.1 4.7 4.5  CL 96* 100 104  CO2 23 20* 20*  GLUCOSE 151* 133* 117*  BUN 67* 68* 60*  CREATININE 3.15* 3.01* 3.02*  CALCIUM 8.8* 8.3* 8.5*  MG  --   --  2.0   GFR: Estimated Creatinine Clearance: 24.7 mL/min (A) (by C-G formula based on SCr of 3.02 mg/dL (H)). Liver Function Tests: Recent Labs  Lab 05/27/19 1923  AST 19  ALT 22  ALKPHOS 72  BILITOT 0.5  PROT 6.4*  ALBUMIN 2.8*   No results for input(s): LIPASE, AMYLASE in the last 168 hours. No results for input(s): AMMONIA in the last 168 hours. Coagulation Profile: No results for input(s): INR, PROTIME in the last 168 hours. Cardiac Enzymes: No results for input(s): CKTOTAL, CKMB, CKMBINDEX, TROPONINI in the last 168 hours. BNP (last 3 results) No results for input(s): PROBNP in the last 8760 hours. HbA1C: Recent Labs    05/27/19 1920  HGBA1C 6.4*   CBG: Recent Labs  Lab 05/28/19 0823 05/28/19 1125 05/28/19 1608 05/28/19 2141 05/29/19 0848  GLUCAP 132* 130* 142* 216* 150*   Lipid Profile: No results for input(s): CHOL, HDL, LDLCALC, TRIG, CHOLHDL, LDLDIRECT in the last 72 hours. Thyroid Function Tests: No results for input(s): TSH, T4TOTAL, FREET4, T3FREE, THYROIDAB in the last 72 hours. Anemia Panel: Recent Labs    05/27/19 1920 05/28/19 0837  FERRITIN 586* 569*  TIBC  --  211*  IRON  --  14*   Sepsis Labs: Recent Labs  Lab 05/27/19 1920 05/27/19 1923  PROCALCITON 0.96  --   LATICACIDVEN  --  0.9    Recent Results (from the past 240 hour(s))  Blood Culture x 1     Status: None (Preliminary result)   Collection Time: 05/27/19  7:23 PM   Specimen: BLOOD RIGHT FOREARM  Result Value Ref Range Status   Specimen Description   Final    BLOOD RIGHT FOREARM BOTTLES DRAWN AEROBIC AND ANAEROBIC Blood Culture results may not be optimal due to an inadequate volume  of blood received in culture bottles Performed at [facility], [place] [address], [place], [place] [address]    Special Requests   Final    NONE Performed at [facility], [place] [address] [place], [place] [address]    Culture   Final    NO GROWTH < 24 HOURS Performed at [place] [address], [place], [place] [address]    Report Status PENDING  Incomplete  Urine culture     Status: Abnormal (Preliminary result)   Collection Time: 05/27/19  7:23 PM   Specimen: Urine, Clean Catch  Result Value Ref Range Status   Specimen Description   Final    URINE, CLEAN CATCH Performed at [facility], [place] [address], [place], [place] [address]    Special Requests   Final    NONE Performed at [facility], [place] [address] [place], [place] [address]    Culture (A)  Final    90,000 COLONIES/mL PROTEUS MIRABILIS SUSCEPTIBILITIES TO FOLLOW Performed at [facility], [place] [address], [place], [place] [address]    Report Status PENDING  Incomplete  C Difficile Quick  Screen w PCR reflex     Status: None   Collection Time: 05/27/19  9:32 PM   Specimen: Stool  Result Value Ref Range Status   C Diff antigen NEGATIVE NEGATIVE Final   C Diff toxin NEGATIVE NEGATIVE Final   C Diff interpretation No C. difficile detected.  Final    Comment: Performed at [facility], [place] [address] [place], [place] [address]         Radiology Studies: CT ABDOMEN PELVIS WO CONTRAST  Result Date: 05/27/2019 CLINICAL DATA:  Diarrhea nausea and vomiting history of bladder cancer status post urostomy EXAM: CT ABDOMEN AND PELVIS WITHOUT CONTRAST TECHNIQUE: Multidetector CT imaging of the abdomen and pelvis was performed following the standard protocol without IV contrast. COMPARISON:  05/12/2019, 10/23/2018 FINDINGS: Lower chest: Lung bases demonstrate new small right-sided pleural effusion. Scattered areas of  subpleural fibrosis. Punctate and nodular calcifications at the right lung base as before. Heart size is normal. Coronary vascular calcification. Hepatobiliary: Probable focal fat infiltration near falciform ligament. No calcified gallstone or biliary dilatation Pancreas: Unremarkable. No pancreatic ductal dilatation or surrounding inflammatory changes. Spleen: Normal in size without focal abnormality. Adrenals/Urinary Tract: Adrenal glands are normal. Complex cystic lesion exophytic to the lower pole right kidney with scattered calcifications, measures up to 5.1 cm. Mild to moderate bilateral hydronephrosis and hydroureter appears slightly worse on the right side compared to most recent prior CT. Hydroureter visualized to ill-defined soft tissue density in the right lower quadrant of the abdomen with surgical suture. Stomach/Bowel: The stomach is nonenlarged. There is no dilated small bowel. Right lower quadrant ostomy. Negative appendix. No colon wall thickening. Vascular/Lymphatic: Nonaneurysmal aorta. Aortic atherosclerosis, moderate. Continued retroperitoneal adenopathy. Left external iliac mass/adenopathy measuring 3.6 cm, grossly unchanged. Pelvic sidewall adenopathy measuring up to 18 mm on the left. Reproductive: Prostate surgically absent. Other: No free air. Small amount of ascites adjacent to the spleen and liver. Ill-defined soft tissue mass in the left lower quadrant/upper pelvis measuring 3.3 x 2.1 cm concerning for metastatic deposit. Irregular soft tissue density within the pelvis measuring approximately 6.8 x 1.6 cm, grossly similar compared to prior. Ill-defined soft tissue thickening and nodularity within the pelvis. Small amount of fluid and ill-defined soft tissue density in the right gutter. Multiple nodules or nodes in the lower abdomen and pelvis. Musculoskeletal: No new abnormality since 05/12/2019. Mild irregular lucency at the S3 segment of the sacrum. IMPRESSION: 1. Status post bladder  resection with right lower quadrant urostomy. Mild to moderate bilateral hydronephrosis, grossly stable on the left and slightly worsened on the right since 05/12/2019. 2. New small right pleural effusion and new small amount of perihepatic and perisplenic ascites. 3. Grossly stable retroperitoneal, pelvic sidewall, and iliac adenopathy consistent with metastatic disease. Numerous pelvic nodules. Slight increased fluid and nodularity in the right gutter with persistent ill-defined soft tissue mass in the left pelvis, all consistent with metastatic disease. Grossly stable lobulated soft tissue density in the pelvis concerning for residual or recurrent disease. Irregular soft tissue density in the right lower quadrant with associated surgical sutures presumably related to history of urostomy surgery, though with slightly masslike features; additional metastatic disease cannot be excluded. Electronically Signed   By: Donavan Foil M.D.   On: 05/27/2019 23:04   XR Chest Single View  Result Date: 05/27/2019 CLINICAL DATA:  Weakness, nausea, vomiting, diarrhea EXAM: PORTABLE CHEST 1 VIEW COMPARISON:  05/12/2019 FINDINGS: There is a right-sided Port-A-Cath. There is right basilar atelectasis. There is no focal  consolidation. There is no pleural effusion or pneumothorax. The heart and mediastinal contours are unremarkable. There is no acute osseous abnormality. IMPRESSION: Mild right basilar atelectasis. Otherwise no acute cardiopulmonary disease. Electronically Signed   By: Kathreen Devoid   On: 05/27/2019 19:49   VAS [place] LOWER EXTREMITY VENOUS (DVT)  Result Date: 05/28/2019  Lower Venous Study Indications: Elevated Ddimer.  Risk Factors: COVID 19 positive Cancer. Anticoagulation: Heparin. Limitations: Bowel gas. Comparison Study: No prior studies. Performing Technologist: Oliver Hum RVT  Examination Guidelines: A complete evaluation includes B-mode imaging, spectral Doppler, color Doppler, and power Doppler as  needed of all accessible portions of each vessel. Bilateral testing is considered an integral part of a complete examination. Limited examinations for reoccurring indications may be performed as noted.  +---------+---------------+---------+-----------+----------+--------------+ RIGHT    CompressibilityPhasicitySpontaneityPropertiesThrombus Aging +---------+---------------+---------+-----------+----------+--------------+ CFV      Partial        No       No                   Acute          +---------+---------------+---------+-----------+----------+--------------+ SFJ      Full                                                        +---------+---------------+---------+-----------+----------+--------------+ FV Prox  Partial        No       No                   Acute          +---------+---------------+---------+-----------+----------+--------------+ FV Mid   Partial        No       No                   Acute          +---------+---------------+---------+-----------+----------+--------------+ FV DistalPartial        No       No                   Acute          +---------+---------------+---------+-----------+----------+--------------+ PFV                                                   Not visualized +---------+---------------+---------+-----------+----------+--------------+ POP      Full           No       No                                  +---------+---------------+---------+-----------+----------+--------------+ PTV      Full                                                        +---------+---------------+---------+-----------+----------+--------------+ PERO     Full                                                        +---------+---------------+---------+-----------+----------+--------------+  EIV      Full           No       No                                   +---------+---------------+---------+-----------+----------+--------------+ CIV                                                   Not visualized +---------+---------------+---------+-----------+----------+--------------+   +---------+---------------+---------+-----------+----------+--------------+ LEFT     CompressibilityPhasicitySpontaneityPropertiesThrombus Aging +---------+---------------+---------+-----------+----------+--------------+ CFV      Partial        No       No                   Acute          +---------+---------------+---------+-----------+----------+--------------+ SFJ      Full                                                        +---------+---------------+---------+-----------+----------+--------------+ FV Prox  Full                                                        +---------+---------------+---------+-----------+----------+--------------+ FV Mid   Partial        No       No                   Acute          +---------+---------------+---------+-----------+----------+--------------+ FV DistalPartial        No       No                   Acute          +---------+---------------+---------+-----------+----------+--------------+ PFV                                                   Not visualized +---------+---------------+---------+-----------+----------+--------------+ POP      Partial        No       No                   Acute          +---------+---------------+---------+-----------+----------+--------------+ PTV      Partial                                      Acute          +---------+---------------+---------+-----------+----------+--------------+ PERO     Full                                                        +---------+---------------+---------+-----------+----------+--------------+  Gastroc  None                                         Acute           +---------+---------------+---------+-----------+----------+--------------+ SSV      Full                                                        +---------+---------------+---------+-----------+----------+--------------+ EIV      Full           No       No                                  +---------+---------------+---------+-----------+----------+--------------+ CIV                                                   Not visualized +---------+---------------+---------+-----------+----------+--------------+    Summary: Right: Findings consistent with acute deep vein thrombosis involving the right common femoral vein, and right femoral vein. No cystic structure found in the popliteal fossa. The external iliac vein appears patent. Left: Findings consistent with acute deep vein thrombosis involving the left common femoral vein, left femoral vein, left popliteal vein, left posterior tibial veins, and left gastrocnemius veins. No cystic structure found in the popliteal fossa. The external iliac vein appears patent.  *See table(s) above for measurements and observations. Electronically signed by Deitra Mayo MD on 05/28/2019 at 1:28:50 PM.    Final         Scheduled Meds: . vitamin C  500 mg Oral BID  . Chlorhexidine Gluconate Cloth  6 each Topical Daily  . cholecalciferol  2,000 Units Oral Daily  . insulin aspart  0-5 Units Subcutaneous QHS  . insulin aspart  0-9 Units Subcutaneous TID WC  . zinc sulfate  220 mg Oral Daily   Continuous Infusions: . sodium chloride 100 mL/hr at 05/29/19 0504  . cefTRIAXone (ROCEPHIN)  IV 1 g (05/28/19 2204)  . heparin 1,600 Units/hr (05/29/19 0610)     LOS: 2 days    Time spent: 36 minutes spent on chart review, discussion with nursing staff, consultants, updating family and interview/physical exam; more than 50% of that time was spent in counseling and/or coordination of care.    Daymond Cordts J [place] DO [facility] 05/29/2019, 11:15  AM

## 2019-05-29 NOTE — Plan of Care (Signed)

## 2019-05-30 LAB — CBC
HCT: 25.8 % — ABNORMAL LOW (ref 39.0–52.0)
Hemoglobin: 8.3 g/dL — ABNORMAL LOW (ref 13.0–17.0)
MCH: 29.7 pg (ref 26.0–34.0)
MCHC: 32.2 g/dL (ref 30.0–36.0)
MCV: 92.5 fL (ref 80.0–100.0)
Platelets: 325 10*3/uL (ref 150–400)
RBC: 2.79 MIL/uL — ABNORMAL LOW (ref 4.22–5.81)
RDW: 13.6 % (ref 11.5–15.5)
WBC: 15 10*3/uL — ABNORMAL HIGH (ref 4.0–10.5)
nRBC: 0 % (ref 0.0–0.2)

## 2019-05-30 LAB — GLUCOSE, CAPILLARY
Glucose-Capillary: 121 mg/dL — ABNORMAL HIGH (ref 70–99)
Glucose-Capillary: 120 mg/dL — ABNORMAL HIGH (ref 70–99)
Glucose-Capillary: 148 mg/dL — ABNORMAL HIGH (ref 70–99)
Glucose-Capillary: 165 mg/dL — ABNORMAL HIGH (ref 70–99)

## 2019-05-30 LAB — GI PATHOGEN PANEL BY PCR, STOOL

## 2019-05-30 LAB — BASIC METABOLIC PANEL
Anion gap: 11 (ref 5–15)
BUN: 53 mg/dL — ABNORMAL HIGH (ref 8–23)
CO2: 19 mmol/L — ABNORMAL LOW (ref 22–32)
Calcium: 8.1 mg/dL — ABNORMAL LOW (ref 8.9–10.3)
Chloride: 103 mmol/L (ref 98–111)
Creatinine, Ser: 2.6 mg/dL — ABNORMAL HIGH (ref 0.61–1.24)
GFR calc Af Amer: 28 mL/min — ABNORMAL LOW (ref >60)
GFR calc non Af Amer: 24 mL/min — ABNORMAL LOW (ref >60)
Glucose, Bld: 139 mg/dL — ABNORMAL HIGH (ref 70–99)
Potassium: 4.5 mmol/L (ref 3.5–5.1)
Sodium: 133 mmol/L — ABNORMAL LOW (ref 135–145)

## 2019-05-30 LAB — HEPARIN LEVEL (UNFRACTIONATED): Heparin Unfractionated: 0.34 IU/mL (ref 0.30–0.70)

## 2019-05-30 MED ORDER — SODIUM CHLORIDE 0.9 % IV SOLN: INTRAVENOUS | Status: AC

## 2019-05-30 NOTE — Progress Notes (Signed)
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 Alen Blew. Bilateral positive pelvic nodes (iliac, obturator). CT this admission 04/2019 with progressive pelvic and retroperitoneal metastasis.   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 He is diabetic. Cr rise to low 3s this admission. Admits to poor PO intake but also some mild bilateral hydo to dep pelvis area where known metastatic tumor depostis exist. K normal, UOP normal.   Today " Sergio Pennington " is stable. Fever curve trending down. GFR slightly improved with hydration. He is having less nauea and malaise.    Objective: Vital signs in last 24 hours: Temp:  [98.3 F (36.8 C)-99.1 F (37.3 C)] 98.3 F (36.8 C) (01/02 0543) Pulse Rate:  [71-86] 71 (01/02 0543) Resp:  [16-19] 16 (01/02 0543) BP: (148-155)/(65-74) 152/65 (01/02 0543) SpO2:  [95 %-99 %] 97 % (01/02 0543) Last BM Date: (unknown)  Intake/Output from previous day: 01/01 0701 - 01/02 0700 In: 3594.5 [P.O.:565; I.V.:2929.3; IV Piggyback:100.2] Out: 1875 [Urine:1875] Intake/Output this shift: Total I/O In: -  Out: 200 [Urine:200]  General appearance: alert, cooperative, appears stated age and come cotninued weight loss, but near baseline.  Eyes: negative Nose: Nares normal. Septum midline. Mucosa normal. No drainage or sinus tenderness. Throat: lips, mucosa, and tongue normal; teeth and gums normal Neck: supple, symmetrical, trachea midline Back: symmetric, no curvature. ROM normal. No CVA tenderness. Resp: non-labored on RA Cardio: Nl rate GI: soft, non-tender; bowel sounds normal; no masses,  no organomegaly and RLQ Urostomy pink / patent wtih copious non-foul urine. No CVAT. Prior scars w/o hernias.  Male genitalia: normal Extremities:  extremities normal, atraumatic, no cyanosis or edema Pulses: 2+ and symmetric Skin: Skin color, texture, turgor normal. No rashes or lesions Lymph nodes: Cervical, supraclavicular, and axillary nodes normal. Neurologic: Grossly normal  Lab Results:  Recent Labs    05/29/19 0248 05/30/19 0307  WBC 15.7* 15.0*  HGB 9.0* 8.3*  HCT 28.4* 25.8*  PLT 311 325   BMET Recent Labs    05/29/19 0248 05/30/19 0307  NA 136 133*  K 4.5 4.5  CL 104 103  CO2 20* 19*  GLUCOSE 117* 139*  BUN 60* 53*  CREATININE 3.02* 2.60*  CALCIUM 8.5* 8.1*   PT/INR No results for input(s): LABPROT, INR in the last 72 hours. ABG No results for input(s): PHART, HCO3 in the last 72 hours.  Invalid input(s): PCO2, PO2  Studies/Results: VAS [place] LOWER EXTREMITY VENOUS (DVT)  Result Date: 05/28/2019  Lower Venous Study Indications: Elevated Ddimer.  Risk Factors: COVID 19 positive Cancer. Anticoagulation: Heparin. Limitations: Bowel gas. Comparison Study: No prior studies. Performing Technologist: Oliver Hum RVT  Examination Guidelines: A complete evaluation includes B-mode imaging, spectral Doppler, color Doppler, and power Doppler as needed of all accessible portions of each vessel. Bilateral testing is considered an integral part of a complete examination. Limited examinations for reoccurring indications may be performed as noted.  +---------+---------------+---------+-----------+----------+--------------+ RIGHT    CompressibilityPhasicitySpontaneityPropertiesThrombus Aging +---------+---------------+---------+-----------+----------+--------------+ CFV      Partial        No       No                   Acute          +---------+---------------+---------+-----------+----------+--------------+ SFJ      Full                                                        +---------+---------------+---------+-----------+----------+--------------+  FV Prox  Partial        No       No                    Acute          +---------+---------------+---------+-----------+----------+--------------+ FV Mid   Partial        No       No                   Acute          +---------+---------------+---------+-----------+----------+--------------+ FV DistalPartial        No       No                   Acute          +---------+---------------+---------+-----------+----------+--------------+ PFV                                                   Not visualized +---------+---------------+---------+-----------+----------+--------------+ POP      Full           No       No                                  +---------+---------------+---------+-----------+----------+--------------+ PTV      Full                                                        +---------+---------------+---------+-----------+----------+--------------+ PERO     Full                                                        +---------+---------------+---------+-----------+----------+--------------+ EIV      Full           No       No                                  +---------+---------------+---------+-----------+----------+--------------+ CIV                                                   Not visualized +---------+---------------+---------+-----------+----------+--------------+   +---------+---------------+---------+-----------+----------+--------------+ LEFT     CompressibilityPhasicitySpontaneityPropertiesThrombus Aging +---------+---------------+---------+-----------+----------+--------------+ CFV      Partial        No       No                   Acute          +---------+---------------+---------+-----------+----------+--------------+ SFJ      Full                                                        +---------+---------------+---------+-----------+----------+--------------+  FV Prox  Full                                                         +---------+---------------+---------+-----------+----------+--------------+ FV Mid   Partial        No       No                   Acute          +---------+---------------+---------+-----------+----------+--------------+ FV DistalPartial        No       No                   Acute          +---------+---------------+---------+-----------+----------+--------------+ PFV                                                   Not visualized +---------+---------------+---------+-----------+----------+--------------+ POP      Partial        No       No                   Acute          +---------+---------------+---------+-----------+----------+--------------+ PTV      Partial                                      Acute          +---------+---------------+---------+-----------+----------+--------------+ PERO     Full                                                        +---------+---------------+---------+-----------+----------+--------------+ Gastroc  None                                         Acute          +---------+---------------+---------+-----------+----------+--------------+ SSV      Full                                                        +---------+---------------+---------+-----------+----------+--------------+ EIV      Full           No       No                                  +---------+---------------+---------+-----------+----------+--------------+ CIV  Not visualized +---------+---------------+---------+-----------+----------+--------------+    Summary: Right: Findings consistent with acute deep vein thrombosis involving the right common femoral vein, and right femoral vein. No cystic structure found in the popliteal fossa. The external iliac vein appears patent. Left: Findings consistent with acute deep vein thrombosis involving the left common femoral vein, left femoral vein, left  popliteal vein, left posterior tibial veins, and left gastrocnemius veins. No cystic structure found in the popliteal fossa. The external iliac vein appears patent.  *See table(s) above for measurements and observations. Electronically signed by Deitra Mayo MD on 05/28/2019 at 1:28:50 PM.    Final     Anti-infectives: Anti-infectives (From admission, onward)   Start     Dose/Rate Route Frequency Ordered Stop   05/27/19 2200  cefTRIAXone (ROCEPHIN) 1 g in sodium chloride 0.9 % 100 mL IVPB     1 g 200 mL/hr over 30 Minutes Intravenous Daily at bedtime 05/27/19 2049        Assessment/Plan:  1 - Metastatic Bladder Cancer - fair-poor overall prognosis with likley 6-62monthlife expectancy. This is incurable at this point. Consider PDL-1 inhibitor or other in elective setting pending his goals of care. No further cancer care in house.   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 Discused with Sergio Pennington option of bilateral neph tubes (to treat malignant hydro) but that he would then likely be neph tueb dependant going forward, at this poitn his K is normal as is volume status and I do not feel necessary. We both agree to hold off as treatment would be worse than problem.   [place] Please call me direclty with questions anytime.   TAlexis Frock1/06/2019

## 2019-05-30 NOTE — Progress Notes (Signed)
Patient would like to have his port accessed for the remainder of his stay. Patient has two patent IV lines to the right lower forearm.

## 2019-05-30 NOTE — Progress Notes (Signed)
[facility] for Heparin Indication: Bilateral DVT  Allergies  Allergen Reactions  . Sulfa Antibiotics Hives   Patient Measurements: Height: 6' (182.9 cm) Weight: 168 lb 15.7 oz (76.6 kg) IBW/kg (Calculated) : 77.6 Heparin Dosing Weight: TBW  Vital Signs: Temp: 98.3 F (36.8 C) (01/02 0543) Temp Source: Oral (01/02 0543) BP: 152/65 (01/02 0543) Pulse Rate: 71 (01/02 0543)  Labs: Recent Labs    05/28/19 0940 05/29/19 0248 05/29/19 0517 05/29/19 1415 05/30/19 0307  HGB 9.1* 9.0*  --   --  8.3*  HCT 27.7* 28.4*  --   --  25.8*  PLT 268 311  --   --  325  HEPARINUNFRC 0.46  --  0.34 0.41 0.34  CREATININE 3.01* 3.02*  --   --  2.60*    Estimated Creatinine Clearance: 28.7 mL/min (A) (by C-G formula based on SCr of 2.6 mg/dL (H)).  Medications:  Infusions:  . sodium chloride 125 mL/hr at 05/30/19 0254  . cefTRIAXone (ROCEPHIN)  IV 1 g (05/29/19 2157)  . heparin 1,600 Units/hr (05/30/19 0433)   Assessment: 23 yoM with metastatic bladder cancer admitted with COVID gastroenteritis and AKI. D-dimer elevated and found to have bilateral DVT. Pharmacy requested to dose IV heparin infusion.   Baseline INR, aPTT: not done  Prior anticoagulation: enoxaparin 30 mg daily ppx; last dose 12/30 at 2216  Significant events:  Today, 05/30/2019:  CBC: Hgb low but stable; Plt stable WNL  Confirmatory heparin level remains borderline therapeutic on 1600 units/hr  No bleeding or infusion issues per nursing  Goal of Therapy: Heparin level 0.3-0.7 units/ml Monitor platelets by anticoagulation protocol: Yes  Plan: Increase heparin gtt slightly to 1,700 units/hr Monitor daily heparin level, CBC, s/s of bleed Plan for DOAC per Onc recs  Elenor Quinones, PharmD, BCPS, BCIDP Clinical Pharmacist 05/30/2019 7:31 AM

## 2019-05-30 NOTE — Progress Notes (Signed)
PROGRESS NOTE    Sergio Pennington  HWE:[id number] DOB: Mar 26, 1951 DOA: 05/27/2019 PCP: Darreld Mclean, MD    Brief Narrative:  Sergio Pennington is a 69 y.o. male with medical history significant of bladder cancer status post urostomy, type 2 diabetes, hypertension, hyperlipidemia presenting to the ED for evaluation of generalized weakness.  Symptoms started 2 weeks ago.  He has had generalized weakness and fatigue.  Intermittent episodes of nonbloody emesis and nonbloody diarrhea.  He is having 3-5/10 intensity generalized abdominal pain and intermittent fevers at home.  He is coughing a little.  Denies shortness of breath or chest pain.  No additional history could be obtained from the patient as he started vomiting.  ED Course: Afebrile and hemodynamically stable.  Oxygen saturation 97-100% on room air. SARS-CoV-2 rapid antigen test positive.  WBC count 18.3.  Lactic acid normal.  Hemoglobin 8.9, ranging between 10.4-11 on recent labs.  Sodium 133.  BUN 67, creatinine 3.1.  Creatinine ranging between 1.6-1.9 on recent labs.  LFTs normal.  UA with large amount of leukocytes, 21-50 RBCs, greater than 50 WBCs, and few bacteria.  Urine culture pending.  Blood culture x2 pending.  C. difficile PCR and GI pathogen panel pending.  Chest x-ray showing mild right basilar atelectasis and no focal consolidation. Patient received 1.5 L normal saline boluses.   Assessment & Plan:   Principal Problem:   COVID-19 virus infection Active Problems:   UTI (urinary tract infection)   Abdominal pain, vomiting, and diarrhea   AKI (acute kidney injury) ([place])   Anemia   Viral gastroenteritis, suspect Covid-19 or related Patient is presenting with a 2-week history of intermittent nonbloody emesis and nonbloody diarrhea.  SARS-CoV-2 rapid antigen test positive.  Afebrile.  WBC count elevated at 18.3.  Lactic acid normal.  No signs of sepsis.  LFTs normal.    CT abdomen/pelvis with mild  to moderate hydronephrosis; left appears stable, slightly worsened on right since 05/12/2019, small right pleural effusion, small perihepatic and perisplenic ascites consistent with metastatic disease.  C. Difficile negative.  GI PCR panel negative. --WBC 18.3-->17.3-->15.7-->15.0 --Blood cultures x 1: No growth x 1 day --IVF with NS at 100 mL/hr --Clear liquid diet, advance as tolerates --Supportive care with antiemetics, Zofran/Phenergan  Acute renal failure Bilateral hydronephrosis R>L Creatinine on admission 3.15 with a baseline 1.2-1.5 for the past year.  Etiology likely secondary to volume loss with acute gastroenteritis/dehydration versus concern for urinary obstruction with fibrosis.  Recent bladder resection with ileostomy by urology Dr. Tresa Moore August 2020. --Urology following, appreciate assistance; no indication for percutaneous nephrostomy at this time, plan to follow-up patient in the outpatient clinic. --Cr 3.15-->3.02-->2.60 --UOP 1,782mL past 24h --continue IVF w/ NS at 125 mL/hr --Avoid nephrotoxins, renally dose all medications --Strict I's and O's --Monitor renal function daily  Proteus/Klebsiella PNA Urinary tract infection History of bladder cancer status post urostomy.  Afebrile.  WBC count elevated at 18.3. Urinalysis with large leukoesterase, negative nitrite, few bacteria and greater than 50 WBCs. --Continue antibiotics with ceftriaxone --wound ostomy consult for continued urine ostomy care  Acute bilateral lower extremity DVT Vascular bilat lower extremity ultrasound remarkable for acute bilateral DVT to right common femoral, right femoral, left common femoral, left femoral, left popliteal, left posterior tibial veins and left gastrocnemius vein. --Continue heparin drip for now-->can transition to NOAC (Eliquis/Xarelto) per Oncology once off drip; will continue for now to ensure patient does not need percutaneous nephrostomy placement --Transition of care consult  for assistance with  co-pay for Xarelto and Eliquis  COVID-19 infection SARS-CoV-2 rapid antigen test positive. Patient without hypoxia and chest x-ray showing mild right basilar atelectasis and no focal consolidation. D-dimer greater than 20, ferritin 586, LDH 150, CRP 20.3.   --Will hold off treatment with steroids, remdesivir or Actemra --Continue to monitor respiratory status closely, currently on room air --Vitamin C, zinc --Airborne/contact isolation precautions  Physical deconditioning Suspect related to UTI and acute viral illness. --Management as mentioned above --PT recommends home health on discharge, home health orders placed.  Hx Bladder cancer Follows with medical oncology, Dr. Alen Blew. S/P cystectomy with ileal conduit diversion on 01/15/2019 by Dr. Tresa Moore with urology.  CT abdomen/pelvis with possible progression of metastatic disease process.  Discussed with Dr. Alen Blew; he will follow patient up closely following discharge for further recommendations.  Normocytic anemia Hemoglobin 8.9 and MCV 92.4.  Hemoglobin was ranging between 10.4-11 on recent labs.  No signs of active bleeding. FOBT negative.   Mild hyponatremia Suspect related to GI losses from emesis/diarrhea and decreased p.o. intake.  Sodium 133. --IV fluid hydration --Continue to monitor sodium level daily  Noninsulin-dependent type 2 diabetes Hemoglobin A1c 6.4. --Sensitive sliding scale insulin for coverage  --ACHS and CBG checks.  DVT prophylaxis: Heparin drip; plan to transition to NOAC (Xarelto/Eliquis) ok per Oncology Code Status: Full Code Family Communication: Updated patient's spouse Sergio Pennington via telephone this afternoon. Disposition Plan: Continue inpatient, heparin drip, IV antibiotics, further depending on clinical course, anticipate discharge home when medically ready   Consultants:   Urology - Dr. Claudia Desanctis, Dr. Tresa Moore  Oncology - Dr. Alen Blew; discussed plan of care over telephone on  05/28/2019  Procedures:   None  Antimicrobials:   Ceftriaxone 12/30>>   Subjective: Patient seen and examined at bedside, sitting in bedside chair.  Requesting advancement of his diet.  Denies any further diarrhea.  Continues with mild weakness/fatigue, but improved.  Seen by urology, Dr. Tresa Moore this morning.  Unlikely need for percutaneous nephrostomy; given improvement in creatinine. No other specific complaints or concerns at this time.  Denies headache, no visual changes, no chest pain, no palpitations, no shortness of breath, no cough/congestion, no abdominal pain, no paresthesias.  No acute events overnight per nursing staff.  Objective: Vitals:   05/29/19 1145 05/29/19 2135 05/30/19 0543 05/30/19 1149  BP: (!) 155/74 (!) 148/70 (!) 152/65 (!) 165/73  Pulse: 86 80 71 77  Resp: 19 18 16 13   Temp: 98.7 F (37.1 C) 99.1 F (37.3 C) 98.3 F (36.8 C) 98.7 F (37.1 C)  TempSrc:  Oral Oral   SpO2: 99% 95% 97% 96%  Weight:      Height:        Intake/Output Summary (Last 24 hours) at 05/30/2019 1320 Last data filed at 05/30/2019 0800 Gross per 24 hour  Intake 3469.48 ml  Output 1425 ml  Net 2044.48 ml   Filed Weights   05/27/19 1846  Weight: 76.6 kg    Examination:  General exam: Appears calm and comfortable  Respiratory system: Clear to auscultation. Respiratory effort normal.  Oxygenating well on room air Cardiovascular system: S1 & S2 heard, RRR. No JVD, murmurs, rubs, gallops or clicks. No pedal edema. Gastrointestinal system: Abdomen is nondistended, soft, mild generalized tenderness, no rebound/guarding/masses. No organomegaly.  Slightly hyperactive bowel sounds.   GU: Urostomy noted; cloudy urine with sediment Central nervous system: Alert and oriented. No focal neurological deficits. Extremities: Symmetric 5 x 5 power. Skin: No rashes, lesions or ulcers Psychiatry: Judgement and insight appear  normal. Mood & affect appropriate.     Data Reviewed: I have  personally reviewed following labs and imaging studies  CBC: Recent Labs  Lab 05/27/19 1923 05/28/19 0940 05/29/19 0248 05/30/19 0307  WBC 18.3* 17.3* 15.7* 15.0*  NEUTROABS  --  14.1*  --   --   HGB 8.9* 9.1* 9.0* 8.3*  HCT 27.8* 27.7* 28.4* 25.8*  MCV 92.4 93.9 94.7 92.5  PLT 264 268 311 740   Basic Metabolic Panel: Recent Labs  Lab 05/27/19 1923 05/28/19 0940 05/29/19 0248 05/30/19 0307  NA 132* 134* 136 133*  K 5.1 4.7 4.5 4.5  CL 96* 100 104 103  CO2 23 20* 20* 19*  GLUCOSE 151* 133* 117* 139*  BUN 67* 68* 60* 53*  CREATININE 3.15* 3.01* 3.02* 2.60*  CALCIUM 8.8* 8.3* 8.5* 8.1*  MG  --   --  2.0  --    GFR: Estimated Creatinine Clearance: 28.7 mL/min (A) (by C-G formula based on SCr of 2.6 mg/dL (H)). Liver Function Tests: Recent Labs  Lab 05/27/19 1923  AST 19  ALT 22  ALKPHOS 72  BILITOT 0.5  PROT 6.4*  ALBUMIN 2.8*   No results for input(s): LIPASE, AMYLASE in the last 168 hours. No results for input(s): AMMONIA in the last 168 hours. Coagulation Profile: No results for input(s): INR, PROTIME in the last 168 hours. Cardiac Enzymes: No results for input(s): CKTOTAL, CKMB, CKMBINDEX, TROPONINI in the last 168 hours. BNP (last 3 results) No results for input(s): PROBNP in the last 8760 hours. HbA1C: Recent Labs    05/27/19 1920  HGBA1C 6.4*   CBG: Recent Labs  Lab 05/29/19 1136 05/29/19 1717 05/29/19 2132 05/30/19 0756 05/30/19 1145  GLUCAP 143* 152* 166* 121* 120*   Lipid Profile: No results for input(s): CHOL, HDL, LDLCALC, TRIG, CHOLHDL, LDLDIRECT in the last 72 hours. Thyroid Function Tests: No results for input(s): TSH, T4TOTAL, FREET4, T3FREE, THYROIDAB in the last 72 hours. Anemia Panel: Recent Labs    05/27/19 1920 05/28/19 0837  FERRITIN 586* 569*  TIBC  --  211*  IRON  --  14*   Sepsis Labs: Recent Labs  Lab 05/27/19 1920 05/27/19 1923  PROCALCITON 0.96  --   LATICACIDVEN  --  0.9    Recent Results (from the  past 240 hour(s))  Blood Culture x 1     Status: None (Preliminary result)   Collection Time: 05/27/19  7:23 PM   Specimen: BLOOD RIGHT FOREARM  Result Value Ref Range Status   Specimen Description   Final    BLOOD RIGHT FOREARM BOTTLES DRAWN AEROBIC AND ANAEROBIC Blood Culture results may not be optimal due to an inadequate volume of blood received in culture bottles Performed at [facility], [place] [address], [place], [place] [address]    Special Requests   Final    NONE Performed at [facility], [place] [address] [place], [place] [address]    Culture   Final    NO GROWTH 2 DAYS Performed at [place] [address], [place], [place] [address]    Report Status PENDING  Incomplete  Urine culture     Status: Abnormal (Preliminary result)   Collection Time: 05/27/19  7:23 PM   Specimen: Urine, Clean Catch  Result Value Ref Range Status   Specimen Description   Final    URINE, CLEAN CATCH Performed at [facility], [place] [address] [place], [place] [address]  Special Requests   Final    NONE Performed at [facility], [place] [address], [place], [place] [address]    Culture (A)  Final    90,000 COLONIES/mL PROTEUS MIRABILIS SUSCEPTIBILITIES TO FOLLOW CULTURE REINCUBATED FOR BETTER GROWTH Performed at [facility], [place] [address], [place], [place] [address]    Report Status PENDING  Incomplete   Organism ID, Bacteria PROTEUS MIRABILIS (A)  Final      Susceptibility   Proteus mirabilis - MIC*    AMPICILLIN <=2 SENSITIVE Sensitive     CEFAZOLIN <=4 SENSITIVE Sensitive     CEFTRIAXONE <=0.25 SENSITIVE Sensitive     CIPROFLOXACIN <=0.25 SENSITIVE Sensitive     GENTAMICIN <=1 SENSITIVE Sensitive     IMIPENEM 2 SENSITIVE Sensitive     NITROFURANTOIN 128 RESISTANT Resistant     TRIMETH/SULFA <=20 SENSITIVE Sensitive     AMPICILLIN/SULBACTAM <=2 SENSITIVE Sensitive     PIP/TAZO <=4 SENSITIVE  Sensitive     * 90,000 COLONIES/mL PROTEUS MIRABILIS  GI pathogen panel by PCR, stool     Status: None   Collection Time: 05/27/19  9:32 PM   Specimen: Stool  Result Value Ref Range Status   Plesiomonas shigelloides NOT DETECTED NOT DETECTED Final   Yersinia enterocolitica NOT DETECTED NOT DETECTED Final   Vibrio NOT DETECTED NOT DETECTED Final   Enteropathogenic E coli NOT DETECTED NOT DETECTED Final   E coli (ETEC) LT/ST NOT DETECTED NOT DETECTED Final   E coli 3474 by PCR Not applicable NOT DETECTED Final   Cryptosporidium by PCR NOT DETECTED NOT DETECTED Final   Entamoeba histolytica NOT DETECTED NOT DETECTED Final   Adenovirus F 40/41 NOT DETECTED NOT DETECTED Final   Norovirus GI/GII NOT DETECTED NOT DETECTED Final   Sapovirus NOT DETECTED NOT DETECTED Final    Comment: (NOTE) Performed At: [facility] [place], [place] [id number] Rush Farmer MD IE:[id number]    Vibrio cholerae NOT DETECTED NOT DETECTED Final   Campylobacter by PCR NOT DETECTED NOT DETECTED Final   Salmonella by PCR NOT DETECTED NOT DETECTED Final   E coli (STEC) NOT DETECTED NOT DETECTED Final   Enteroaggregative E coli NOT DETECTED NOT DETECTED Final   Shigella by PCR NOT DETECTED NOT DETECTED Final   Cyclospora cayetanensis NOT DETECTED NOT DETECTED Final   Astrovirus NOT DETECTED NOT DETECTED Final   G lamblia by PCR NOT DETECTED NOT DETECTED Final   Rotavirus A by PCR NOT DETECTED NOT DETECTED Final  C Difficile Quick Screen w PCR reflex     Status: None   Collection Time: 05/27/19  9:32 PM   Specimen: Stool  Result Value Ref Range Status   C Diff antigen NEGATIVE NEGATIVE Final   C Diff toxin NEGATIVE NEGATIVE Final   C Diff interpretation No C. difficile detected.  Final    Comment: Performed at [facility], [place] [address] [place], [place] [address]         Radiology Studies: No results found.      Scheduled Meds: . vitamin C  500 mg  Oral BID  . Chlorhexidine Gluconate Cloth  6 each Topical Daily  . cholecalciferol  2,000 Units Oral Daily  . insulin aspart  0-5 Units Subcutaneous QHS  . insulin aspart  0-9 Units Subcutaneous TID WC  . zinc sulfate  220 mg Oral Daily   Continuous Infusions: . cefTRIAXone (ROCEPHIN)  IV 1 g (05/29/19 2157)  . heparin 1,700 Units/hr (  05/30/19 0803)     LOS: 3 days    Time spent: 36 minutes spent on chart review, discussion with nursing staff, consultants, updating family and interview/physical exam; more than 50% of that time was spent in counseling and/or coordination of care.    Zari Cly J [place] DO [facility] 05/30/2019, 1:20 PM

## 2019-05-30 NOTE — Progress Notes (Signed)
Physical Therapy Treatment Patient Details Name: Sergio Pennington MRN: [id number] DOB: 1950-07-26 Today's Date: 05/30/2019    History of Present Illness 69 y.o. male with medical history significant of bladder cancer status post urostomy, type 2 diabetes, hypertension, hyperlipidemia presenting to the ED for evaluation of generalized weakness.  Symptoms started 2 weeks ago.  He has had generalized weakness and fatigue.  Intermittent episodes of nonbloody emesis and nonbloody diarrhea.  He is having 3-5/10 intensity generalized abdominal pain and intermittent fevers at home.  He is coughing a little.  Denies shortness of breath or chest pain.  No additional history could be obtained from the patient as he started vomiting.    PT Comments    Pt continues to participate well.    Follow Up Recommendations  Home health PT     Equipment Recommendations  Rolling walker with 5" wheels    Recommendations for Other Services       Precautions / Restrictions Precautions Precautions: Fall Restrictions Weight Bearing Restrictions: No    Mobility  Bed Mobility Overal bed mobility: Modified Independent             General bed mobility comments: pt requried extra time  Transfers Overall transfer level: Needs assistance Equipment used: Standard walker;None Transfers: Sit to/from Stand Sit to Stand: Supervision         General transfer comment: for safesty. VCs hand placement  Ambulation/Gait Ambulation/Gait assistance: Min guard Gait Distance (Feet): 65 Feet Assistive device: Rolling walker (2 wheeled);IV Pole Gait Pattern/deviations: Step-through pattern;Decreased stride length     General Gait Details: Walked some with RW and some with IV pole. Pt prefers RW.   Stairs             Wheelchair Mobility    Modified Rankin (Stroke Patients Only)       Balance                                            Cognition Arousal/Alertness:  Awake/alert Behavior During Therapy: WFL for tasks assessed/performed Overall Cognitive Status: Within Functional Limits for tasks assessed                                        Exercises General Exercises - Lower Extremity Hip ABduction/ADduction: AROM;Both;10 reps;Standing Hip Flexion/Marching: AROM;Both;10 reps;Standing Heel Raises: AROM;Both;10 reps;Standing Other Exercises Other Exercises: Knee flexion/hamstring curls, standing, 10 reps, both    General Comments        Pertinent Vitals/Pain Pain Assessment: No/denies pain    Home Living                      Prior Function            PT Goals (current goals can now be found in the care plan section) Progress towards PT goals: Progressing toward goals    Frequency    Min 3X/week      PT Plan Current plan remains appropriate    Co-evaluation              AM-PAC PT "6 Clicks" Mobility   Outcome Measure  Help needed turning from your back to your side while in a flat bed without using bedrails?: None Help needed moving from lying on your back to sitting on  the side of a flat bed without using bedrails?: None Help needed moving to and from a bed to a chair (including a wheelchair)?: A Little Help needed standing up from a chair using your arms (e.g., wheelchair or bedside chair)?: A Little Help needed to walk in hospital room?: A Little Help needed climbing 3-5 steps with a railing? : A Little 6 Click Score: 20    End of Session   Activity Tolerance: Patient tolerated treatment well Patient left: in bed;with call bell/phone within reach   PT Visit Diagnosis: Muscle weakness (generalized) (M62.81);Difficulty in walking, not elsewhere classified (R26.2)     Time: 9357-0177 PT Time Calculation (min) (ACUTE ONLY): 24 min  Charges:  $Gait Training: 23-37 mins                        Doreatha Massed, PT Acute Rehabilitation

## 2019-05-31 LAB — CBC
HCT: 28.3 % — ABNORMAL LOW (ref 39.0–52.0)
Hemoglobin: 8.8 g/dL — ABNORMAL LOW (ref 13.0–17.0)
MCH: 29.2 pg (ref 26.0–34.0)
MCHC: 31.1 g/dL (ref 30.0–36.0)
MCV: 94 fL (ref 80.0–100.0)
Platelets: 358 10*3/uL (ref 150–400)
RBC: 3.01 MIL/uL — ABNORMAL LOW (ref 4.22–5.81)
RDW: 13.8 % (ref 11.5–15.5)
WBC: 16.7 10*3/uL — ABNORMAL HIGH (ref 4.0–10.5)
nRBC: 0 % (ref 0.0–0.2)

## 2019-05-31 LAB — URINE CULTURE: Culture: 90000 — AB

## 2019-05-31 LAB — BASIC METABOLIC PANEL
Anion gap: 9 (ref 5–15)
BUN: 49 mg/dL — ABNORMAL HIGH (ref 8–23)
CO2: 19 mmol/L — ABNORMAL LOW (ref 22–32)
Calcium: 8.4 mg/dL — ABNORMAL LOW (ref 8.9–10.3)
Chloride: 109 mmol/L (ref 98–111)
Creatinine, Ser: 2.4 mg/dL — ABNORMAL HIGH (ref 0.61–1.24)
GFR calc Af Amer: 31 mL/min — ABNORMAL LOW (ref >60)
GFR calc non Af Amer: 26 mL/min — ABNORMAL LOW (ref >60)
Glucose, Bld: 170 mg/dL — ABNORMAL HIGH (ref 70–99)
Potassium: 4.5 mmol/L (ref 3.5–5.1)
Sodium: 137 mmol/L (ref 135–145)

## 2019-05-31 LAB — GLUCOSE, CAPILLARY
Glucose-Capillary: 137 mg/dL — ABNORMAL HIGH (ref 70–99)
Glucose-Capillary: 179 mg/dL — ABNORMAL HIGH (ref 70–99)
Glucose-Capillary: 125 mg/dL — ABNORMAL HIGH (ref 70–99)
Glucose-Capillary: 179 mg/dL — ABNORMAL HIGH (ref 70–99)

## 2019-05-31 LAB — HEPARIN LEVEL (UNFRACTIONATED): Heparin Unfractionated: 0.52 IU/mL (ref 0.30–0.70)

## 2019-05-31 NOTE — Progress Notes (Signed)
Patient ID: Sergio Pennington, male   DOB: [birth year], 69 y.o.   MRN: [id number]   Chart reviewed.    Creatinine continues to fall and is now 2.4 with 962ml UOP on 1/2.    No need for urologic intervention at this time.

## 2019-05-31 NOTE — Progress Notes (Signed)
[place] for Heparin Indication: Bilateral DVT  Allergies  Allergen Reactions  . Sulfa Antibiotics Hives   Patient Measurements: Height: 6' (182.9 cm) Weight: 168 lb 15.7 oz (76.6 kg) IBW/kg (Calculated) : 77.6 Heparin Dosing Weight: TBW  Vital Signs: Temp: 98.2 F (36.8 C) (01/03 0502) BP: 149/72 (01/03 0502) Pulse Rate: 75 (01/03 0502)  Labs: Recent Labs    05/29/19 0248 05/29/19 1415 05/30/19 0307 05/31/19 0424  HGB 9.0*  --  8.3* 8.8*  HCT 28.4*  --  25.8* 28.3*  PLT 311  --  325 358  HEPARINUNFRC  --  0.41 0.34 0.52  CREATININE 3.02*  --  2.60* 2.40*    Estimated Creatinine Clearance: 31.1 mL/min (A) (by C-G formula based on SCr of 2.4 mg/dL (H)).   Assessment: 16 yoM with metastatic bladder cancer admitted with COVID gastroenteritis and AKI. D-dimer elevated and found to have bilateral DVT. Pharmacy requested to dose IV heparin infusion.   Baseline INR, aPTT: not done  Prior anticoagulation: enoxaparin 30 mg daily ppx; last dose 12/30 at 2216  Significant events: will not require percutaneous nephrostomy tubes now per urology  Today, 05/31/2019:  CBC: Hgb low but stable; Plt stable WNL  heparin level therapeutic at 0.52 on 1700 units/hr  No bleeding or infusion issues per nursing  [facility] consulted for co-pays of Xarelto/Eliquis  Goal of Therapy: Heparin level 0.3-0.7 units/ml Monitor platelets by anticoagulation protocol: Yes  Plan: continue heparin at 1,700 units/hr Monitor daily heparin level, CBC, s/s of bleed F/u for transition to [place], Pharm.D [phone] 05/31/2019 8:53 AM

## 2019-05-31 NOTE — Progress Notes (Signed)
PROGRESS NOTE    Sergio Pennington  FGH:[id number] DOB: June 01, 1950 DOA: 05/27/2019 PCP: Darreld Mclean, MD   Brief Narrative: Per previous attending 69 y.o.malewith medical history significant ofbladder cancer status post urostomy, type 2 diabetes, hypertension, hyperlipidemia presenting to the ED for evaluation of generalized weakness.Symptoms started 2 weeks ago. He has had generalized weakness and fatigue. Intermittent episodes of nonbloody emesis and nonbloody diarrhea. He is having 3-5/10intensity generalized abdominal pain and intermittent fevers at home. He is coughing a little. Denies shortness of breath or chest pain. No additional history could be obtained from the patient as he started vomiting.  ED Course:Afebrile and hemodynamically stable. Oxygen saturation 97-100% on room air. SARS-CoV-2 rapid antigen test positive. WBC count 18.3. Lactic acid normal. Hemoglobin 8.9, ranging between 10.4-11 on recent labs. Sodium 133. BUN 67, creatinine 3.1. Creatinine ranging between 1.6-1.9 on recent labs. LFTs normal. UA with large amount of leukocytes, 21-50 RBCs, greater than 50 WBCs, and few bacteria. Urine culture pending. Blood culture x2 pending. C. difficile PCR and GI pathogen ordered ,Chest x-ray showing mild right basilar atelectasis and no focal consolidation. Patient received 1.5 L normal saline boluses. Patient is admitted for further management.  Subjective:  Resting on RA, no CP/COUGH/SOB Foley w clear urine from rt urostomy  No new complaints Creatinine down to 2.4 with UOP 900 mL  On NSS 75 ml/hr and heparin gtt  Assessment & Plan:  Viral gastroenteritis suspected COVID-19 related/COVID-19 viral infection:CT abdomen showed mild to moderate hydronephrosis.  Work-up was negative for C. difficile, GI PCR panel negative.  X-ray on admission mild right basilar atelectasis otherwise no acute cardiopulmonary disease.  Respiratory status is  stable, no tachypnea and not hypoxic.  Continue supportive care-antiemetics, clear liquid diet and advance as tolerated.  COVID-19 antigen positive: D-dimer greater than 20, ferritin 586, LDH 150, CRP 20.3.    Holding off any steroid remdesivir other treatment continue with supportive care.  Continue vitamin C and zinc, airborne precaution.  Monitor closely.  Proteus/Klebsiella urinary tract infection: Urostomy in place.  WBC overall still elevated.  Continue on ceftriaxone.  Ostomy consult for urostomy care  Leukocytosis : In the setting of UTI. WBC is downtrending slightly up this morning.  Monitor.  Patient is afebrile.  Blood culture is negative so far. Recent Labs  Lab 05/27/19 1923 05/28/19 0940 05/29/19 0248 05/30/19 0307 05/31/19 0424  WBC 18.3* 17.3* 15.7* 15.0* 16.7*   AKI/obstructive uropathy with bilateral hydronephrosis right more than left: Creatinine on admission 3.15 with a baseline 1.2-1.5 for the past year.  Etiology likely secondary to volume loss with acute gastroenteritis/dehydration versus concern for urinary obstruction with fibrosis.  Recent bladder resection with ileostomy by urology Dr. Tresa Moore August 2020Followed by urology.  Urostomy in place and draining well.  Creatinine downtrending, at this time no plan for intervention. cont  IV fluids strict I's and O's and monitor. Recent Labs  Lab 05/27/19 1923 05/28/19 0940 05/29/19 0248 05/30/19 0307 05/31/19 0424  BUN 67* 68* 60* 53* 49*  CREATININE 3.15* 3.01* 3.02* 6.78* 9.38*   Metabolic acidosis with bicarb 19 in the setting of renal failure.  Continue fluids and monitor.  Acute bilateral lower extremity DVT: on right common femoral, right femoral, left common femoral, left femoral, left popliteal, left posterior tibial veins and left gastrocnemius vein. continue heparin drip. transition to NOAC per oncology once off drip.  Continue to monitor patient closely and hopefully can transition once creatinine improves  and no need for percutaneous nephrostomy.  Anemia surgical likely from chronic disease: Stable.  Monitor.  FOBT is negative.  Physical deconditioning/debility: Secondary to UTI acute viral illness.  Continue PT OT.  PT recommends home health on discharge.  Orders have been placed.  Bladder cancer hx Follows with medical oncology, Dr. Alen Blew. S/P cystectomy with ileal conduit diversion on 01/15/2019 by Dr. Tresa Moore with urology.  CT abdomen/pelvis with possible progression of metastatic disease process.  earlier MD discussed with Dr. Alen Blew; he will follow patient up closely following discharge for further recommendations  NIDDM A1c 6.4 continue sliding scale insulin and monitor  Mild hyponatremia from GI loss.  Resolved. Continue IV fluids.  Body mass index is 22.92 kg/m.    DVT prophylaxis:Heparin Code Status: full code Family Communication: plan of care discussed with patient at bedside. Disposition Plan: Remains inpatient pending clinical improvement in renal function.  Home health once stable for discharge  Consultants:   Urology - Dr. Claudia Desanctis, Dr. Tresa Moore  Oncology - Dr. Alen Blew; discussed plan of care over telephone on 05/28/2019  Procedures: none Microbiology: Proteus and Klebsiella in urine culture  Antimicrobials: Anti-infectives (From admission, onward)   Start     Dose/Rate Route Frequency Ordered Stop   05/27/19 2200  cefTRIAXone (ROCEPHIN) 1 g in sodium chloride 0.9 % 100 mL IVPB     1 g 200 mL/hr over 30 Minutes Intravenous Daily at bedtime 05/27/19 2049         Objective: Vitals:   05/30/19 0543 05/30/19 1149 05/30/19 2005 05/31/19 0502  BP: (!) 152/65 (!) 165/73 (!) 158/74 (!) 149/72  Pulse: 71 77 88 75  Resp: 16 13 16 18   Temp: 98.3 F (36.8 C) 98.7 F (37.1 C) 98.2 F (36.8 C) 98.2 F (36.8 C)  TempSrc: Oral  Oral   SpO2: 97% 96% 96% 97%  Weight:      Height:        Intake/Output Summary (Last 24 hours) at 05/31/2019 1026 Last data filed at 05/31/2019  0600 Gross per 24 hour  Intake 2468.52 ml  Output 700 ml  Net 1768.52 ml   Filed Weights   05/27/19 1846  Weight: 76.6 kg   Weight change:   Body mass index is 22.92 kg/m.  Intake/Output from previous day: 01/02 0701 - 01/03 0700 In: 2468.5 [I.V.:2368.5; IV Piggyback:100] Out: 900 [Urine:900] Intake/Output this shift: No intake/output data recorded.  Examination:  General exam: AAOx3 ,NAD HEENT:Oral mucosa moist, Ear/Nose WNL grossly, dentition normal. Respiratory system: Diminished at the base,no wheezing or crackles,no use of accessory muscle Cardiovascular system: S1 & S2 +, No JVD,. Gastrointestinal system: Abdomen soft, NT,ND, BS+ Rt Urostomy bag+ attached to foley w/ clear urine Nervous System:Alert, awake, moving extremities and grossly nonfocal Extremities: No edema, distal peripheral pulses palpable.  Skin: No rashes,no icterus. MSK: Normal muscle bulk,tone, power  Medications:  Scheduled Meds: . vitamin C  500 mg Oral BID  . Chlorhexidine Gluconate Cloth  6 each Topical Daily  . cholecalciferol  2,000 Units Oral Daily  . insulin aspart  0-5 Units Subcutaneous QHS  . insulin aspart  0-9 Units Subcutaneous TID WC  . zinc sulfate  220 mg Oral Daily   Continuous Infusions: . sodium chloride 75 mL/hr at 05/30/19 2107  . cefTRIAXone (ROCEPHIN)  IV Stopped (05/30/19 2359)  . heparin 1,700 Units/hr (05/30/19 1933)    Data Reviewed: I have personally reviewed following labs and imaging studies  CBC: Recent Labs  Lab 05/27/19 1923 05/28/19 0940 05/29/19 0248 05/30/19 0307 05/31/19 0424  WBC  18.3* 17.3* 15.7* 15.0* 16.7*  NEUTROABS  --  14.1*  --   --   --   HGB 8.9* 9.1* 9.0* 8.3* 8.8*  HCT 27.8* 27.7* 28.4* 25.8* 28.3*  MCV 92.4 93.9 94.7 92.5 94.0  PLT 264 268 311 325 283   Basic Metabolic Panel: Recent Labs  Lab 05/27/19 1923 05/28/19 0940 05/29/19 0248 05/30/19 0307 05/31/19 0424  NA 132* 134* 136 133* 137  K 5.1 4.7 4.5 4.5 4.5  CL 96*  100 104 103 109  CO2 23 20* 20* 19* 19*  GLUCOSE 151* 133* 117* 139* 170*  BUN 67* 68* 60* 53* 49*  CREATININE 3.15* 3.01* 3.02* 2.60* 2.40*  CALCIUM 8.8* 8.3* 8.5* 8.1* 8.4*  MG  --   --  2.0  --   --    GFR: Estimated Creatinine Clearance: 31.1 mL/min (A) (by C-G formula based on SCr of 2.4 mg/dL (H)). Liver Function Tests: Recent Labs  Lab 05/27/19 1923  AST 19  ALT 22  ALKPHOS 72  BILITOT 0.5  PROT 6.4*  ALBUMIN 2.8*   No results for input(s): LIPASE, AMYLASE in the last 168 hours. No results for input(s): AMMONIA in the last 168 hours. Coagulation Profile: No results for input(s): INR, PROTIME in the last 168 hours. Cardiac Enzymes: No results for input(s): CKTOTAL, CKMB, CKMBINDEX, TROPONINI in the last 168 hours. BNP (last 3 results) No results for input(s): PROBNP in the last 8760 hours. HbA1C: No results for input(s): HGBA1C in the last 72 hours. CBG: Recent Labs  Lab 05/30/19 0756 05/30/19 1145 05/30/19 1701 05/30/19 2000 05/31/19 0754  GLUCAP 121* 120* 148* 165* 137*   Lipid Profile: No results for input(s): CHOL, HDL, LDLCALC, TRIG, CHOLHDL, LDLDIRECT in the last 72 hours. Thyroid Function Tests: No results for input(s): TSH, T4TOTAL, FREET4, T3FREE, THYROIDAB in the last 72 hours. Anemia Panel: No results for input(s): VITAMINB12, FOLATE, FERRITIN, TIBC, IRON, RETICCTPCT in the last 72 hours. Sepsis Labs: Recent Labs  Lab 05/27/19 1920 05/27/19 1923  PROCALCITON 0.96  --   LATICACIDVEN  --  0.9    Recent Results (from the past 240 hour(s))  Blood Culture x 1     Status: None (Preliminary result)   Collection Time: 05/27/19  7:23 PM   Specimen: BLOOD RIGHT FOREARM  Result Value Ref Range Status   Specimen Description   Final    BLOOD RIGHT FOREARM BOTTLES DRAWN AEROBIC AND ANAEROBIC Blood Culture results may not be optimal due to an inadequate volume of blood received in culture bottles Performed at [facility], [place]  [address] [place], [place] [address]    Special Requests   Final    NONE Performed at [facility], [place] [address], [place], [place] [address]    Culture   Final    NO GROWTH 3 DAYS Performed at [facility], [place] [address], [place], [place] [address]    Report Status PENDING  Incomplete  Urine culture     Status: Abnormal   Collection Time: 05/27/19  7:23 PM   Specimen: Urine, Clean Catch  Result Value Ref Range Status   Specimen Description   Final    URINE, CLEAN CATCH Performed at [facility], [place] [address], [place], [place] [address]    Special Requests   Final    NONE Performed at [facility], [place] [address] [place], [place] [address]    Culture (A)  Final  90,000 COLONIES/mL PROTEUS MIRABILIS >=100,000 COLONIES/mL KLEBSIELLA PNEUMONIAE    Report Status 05/31/2019 FINAL  Final   Organism ID, Bacteria PROTEUS MIRABILIS (A)  Final   Organism ID, Bacteria KLEBSIELLA PNEUMONIAE (A)  Final      Susceptibility   Klebsiella pneumoniae - MIC*    AMPICILLIN >=32 RESISTANT Resistant     CEFAZOLIN <=4 SENSITIVE Sensitive     CEFTRIAXONE <=0.25 SENSITIVE Sensitive     CIPROFLOXACIN <=0.25 SENSITIVE Sensitive     GENTAMICIN <=1 SENSITIVE Sensitive     IMIPENEM <=0.25 SENSITIVE Sensitive     NITROFURANTOIN 128 RESISTANT Resistant     TRIMETH/SULFA <=20 SENSITIVE Sensitive     AMPICILLIN/SULBACTAM 8 SENSITIVE Sensitive     PIP/TAZO <=4 SENSITIVE Sensitive     * >=100,000 COLONIES/mL KLEBSIELLA PNEUMONIAE   Proteus mirabilis - MIC*    AMPICILLIN <=2 SENSITIVE Sensitive     CEFAZOLIN <=4 SENSITIVE Sensitive     CEFTRIAXONE <=0.25 SENSITIVE Sensitive     CIPROFLOXACIN <=0.25 SENSITIVE Sensitive     GENTAMICIN <=1 SENSITIVE Sensitive     IMIPENEM 2 SENSITIVE Sensitive     NITROFURANTOIN 128 RESISTANT Resistant     TRIMETH/SULFA <=20 SENSITIVE Sensitive     AMPICILLIN/SULBACTAM <=2 SENSITIVE Sensitive      PIP/TAZO <=4 SENSITIVE Sensitive     * 90,000 COLONIES/mL PROTEUS MIRABILIS  GI pathogen panel by PCR, stool     Status: None   Collection Time: 05/27/19  9:32 PM   Specimen: Stool  Result Value Ref Range Status   Plesiomonas shigelloides NOT DETECTED NOT DETECTED Final   Yersinia enterocolitica NOT DETECTED NOT DETECTED Final   Vibrio NOT DETECTED NOT DETECTED Final   Enteropathogenic E coli NOT DETECTED NOT DETECTED Final   E coli (ETEC) LT/ST NOT DETECTED NOT DETECTED Final   E coli 1093 by PCR Not applicable NOT DETECTED Final   Cryptosporidium by PCR NOT DETECTED NOT DETECTED Final   Entamoeba histolytica NOT DETECTED NOT DETECTED Final   Adenovirus F 40/41 NOT DETECTED NOT DETECTED Final   Norovirus GI/GII NOT DETECTED NOT DETECTED Final   Sapovirus NOT DETECTED NOT DETECTED Final    Comment: (NOTE) Performed At: [facility] [place], [place] [id number] Rush Farmer MD UR:[id number]    Vibrio cholerae NOT DETECTED NOT DETECTED Final   Campylobacter by PCR NOT DETECTED NOT DETECTED Final   Salmonella by PCR NOT DETECTED NOT DETECTED Final   E coli (STEC) NOT DETECTED NOT DETECTED Final   Enteroaggregative E coli NOT DETECTED NOT DETECTED Final   Shigella by PCR NOT DETECTED NOT DETECTED Final   Cyclospora cayetanensis NOT DETECTED NOT DETECTED Final   Astrovirus NOT DETECTED NOT DETECTED Final   G lamblia by PCR NOT DETECTED NOT DETECTED Final   Rotavirus A by PCR NOT DETECTED NOT DETECTED Final  C Difficile Quick Screen w PCR reflex     Status: None   Collection Time: 05/27/19  9:32 PM   Specimen: Stool  Result Value Ref Range Status   C Diff antigen NEGATIVE NEGATIVE Final   C Diff toxin NEGATIVE NEGATIVE Final   C Diff interpretation No C. difficile detected.  Final    Comment: Performed at [facility], [place] [address] [place], [place] [address]      Radiology Studies: No results found.    LOS: 4 days   Time spent:  More than 50% of that time was spent in counseling and/or coordination of care.  Maren Beach  Lupita Leash, MD [facility]  05/31/2019, 10:26 AM

## 2019-06-01 DIAGNOSIS — U071 COVID-19: Secondary | ICD-10-CM

## 2019-06-01 LAB — CBC
HCT: 27.3 % — ABNORMAL LOW (ref 39.0–52.0)
Hemoglobin: 8.6 g/dL — ABNORMAL LOW (ref 13.0–17.0)
MCH: 29.6 pg (ref 26.0–34.0)
MCHC: 31.5 g/dL (ref 30.0–36.0)
MCV: 93.8 fL (ref 80.0–100.0)
Platelets: 389 10*3/uL (ref 150–400)
RBC: 2.91 MIL/uL — ABNORMAL LOW (ref 4.22–5.81)
RDW: 14.1 % (ref 11.5–15.5)
WBC: 17.7 10*3/uL — ABNORMAL HIGH (ref 4.0–10.5)
nRBC: 0 % (ref 0.0–0.2)

## 2019-06-01 LAB — BASIC METABOLIC PANEL
Anion gap: 9 (ref 5–15)
BUN: 45 mg/dL — ABNORMAL HIGH (ref 8–23)
CO2: 18 mmol/L — ABNORMAL LOW (ref 22–32)
Calcium: 8.6 mg/dL — ABNORMAL LOW (ref 8.9–10.3)
Chloride: 110 mmol/L (ref 98–111)
Creatinine, Ser: 2.38 mg/dL — ABNORMAL HIGH (ref 0.61–1.24)
GFR calc Af Amer: 31 mL/min — ABNORMAL LOW (ref >60)
GFR calc non Af Amer: 27 mL/min — ABNORMAL LOW (ref >60)
Glucose, Bld: 134 mg/dL — ABNORMAL HIGH (ref 70–99)
Potassium: 4.6 mmol/L (ref 3.5–5.1)
Sodium: 137 mmol/L (ref 135–145)

## 2019-06-01 LAB — HEPARIN LEVEL (UNFRACTIONATED): Heparin Unfractionated: 0.58 IU/mL (ref 0.30–0.70)

## 2019-06-01 LAB — GLUCOSE, CAPILLARY
Glucose-Capillary: 109 mg/dL — ABNORMAL HIGH (ref 70–99)
Glucose-Capillary: 152 mg/dL — ABNORMAL HIGH (ref 70–99)
Glucose-Capillary: 104 mg/dL — ABNORMAL HIGH (ref 70–99)
Glucose-Capillary: 128 mg/dL — ABNORMAL HIGH (ref 70–99)

## 2019-06-01 NOTE — Care Management Note (Signed)
Case Management Note  Patient Details  Name: Sergio Pennington MRN: [id number] Date of Birth: 1950/11/20  Subjective/Objective:                    Action/Plan:   Expected Discharge Date:                  Expected Discharge Plan:     In-House Referral:     Discharge planning Services     Post Acute Care Choice:    Choice offered to:     DME Arranged:    DME Agency:     HH Arranged:    HH Agency:     Status of Service:     If discussed at [employer] of Stay Meetings, dates discussed:    Additional Comments:  Kerin Salen 06/01/2019, 4:57 PM

## 2019-06-01 NOTE — Progress Notes (Signed)
Physical Therapy Treatment Patient Details Name: Sergio Pennington MRN: [id number] DOB: 01-02-1951 Today's Date: 06/01/2019    History of Present Illness 69 y.o. male with medical history significant of bladder cancer status post urostomy, type 2 diabetes, hypertension, hyperlipidemia presenting to the ED for evaluation of generalized weakness.  Symptoms started 2 weeks ago.  He has had generalized weakness and fatigue.  Intermittent episodes of nonbloody emesis and nonbloody diarrhea.  He is having 3-5/10 intensity generalized abdominal pain and intermittent fevers at home.  He is coughing a little.  Denies shortness of breath or chest pain.  No additional history could be obtained from the patient as he started vomiting.    PT Comments    Pt continues to participate well. Some unsteadiness noted on today.    Follow Up Recommendations  Home health PT     Equipment Recommendations  Rolling walker with 5" wheels    Recommendations for Other Services       Precautions / Restrictions Precautions Precautions: Fall Restrictions Weight Bearing Restrictions: No    Mobility  Bed Mobility Overal bed mobility: Modified Independent             General bed mobility comments: pt requried extra time and use of bedrails  Transfers Overall transfer level: Needs assistance Equipment used: Rolling walker (2 wheeled) Transfers: Sit to/from Stand Sit to Stand: Supervision         General transfer comment: for safesty. VCs hand placement  Ambulation/Gait Ambulation/Gait assistance: Min guard Gait Distance (Feet): 75 Feet Assistive device: Rolling walker (2 wheeled) Gait Pattern/deviations: Step-through pattern;Decreased stride length     General Gait Details: 5 laps around room. Used RW entire time today. Some unsteadiness noted. Pt has a bit of trouble navigating around obstacles in room.   Stairs             Wheelchair Mobility    Modified Rankin (Stroke  Patients Only)       Balance Overall balance assessment: Needs assistance           Standing balance-Leahy Scale: Fair                              Cognition Arousal/Alertness: Awake/alert Behavior During Therapy: WFL for tasks assessed/performed Overall Cognitive Status: Within Functional Limits for tasks assessed                                        Exercises General Exercises - Lower Extremity Hip ABduction/ADduction: AROM;Both;10 reps;Standing Hip Flexion/Marching: AROM;Both;10 reps;Standing Heel Raises: AROM;Both;10 reps;Standing Other Exercises Other Exercises: Knee flexion/hamstring curls, standing, 10 reps, both. Incline push-ups (with hands on sink countertop) x 10 reps    General Comments        Pertinent Vitals/Pain Pain Assessment: No/denies pain    Home Living                      Prior Function            PT Goals (current goals can now be found in the care plan section) Progress towards PT goals: Progressing toward goals    Frequency    Min 3X/week      PT Plan Current plan remains appropriate    Co-evaluation              AM-PAC PT "6  Clicks" Mobility   Outcome Measure  Help needed turning from your back to your side while in a flat bed without using bedrails?: None Help needed moving from lying on your back to sitting on the side of a flat bed without using bedrails?: None Help needed moving to and from a bed to a chair (including a wheelchair)?: A Little Help needed standing up from a chair using your arms (e.g., wheelchair or bedside chair)?: A Little Help needed to walk in hospital room?: A Little Help needed climbing 3-5 steps with a railing? : A Little 6 Click Score: 20    End of Session   Activity Tolerance: Patient tolerated treatment well Patient left: in bed;with call bell/phone within reach;with bed alarm set   PT Visit Diagnosis: Muscle weakness (generalized)  (M62.81);Difficulty in walking, not elsewhere classified (R26.2)     Time: 1410-1430 PT Time Calculation (min) (ACUTE ONLY): 20 min  Charges:  $Gait Training: 8-22 mins                        Doreatha Massed, PT Acute Rehabilitation

## 2019-06-01 NOTE — Care Management Important Message (Signed)
Important Message  Patient Details IM Letter given to Gabriel Earing RN Case Manager to present to the Patient Name: Sergio Pennington MRN: [id number] Date of Birth: [birth year]   Medicare Important Message Given:  Yes     Kerin Salen 06/01/2019, 12:30 PM

## 2019-06-01 NOTE — Progress Notes (Signed)
[facility] for Heparin Indication: Bilateral DVT  Allergies  Allergen Reactions  . Sulfa Antibiotics Hives   Patient Measurements: Height: 6' (182.9 cm) Weight: 168 lb 15.7 oz (76.6 kg) IBW/kg (Calculated) : 77.6 Heparin Dosing Weight: TBW  Vital Signs: Temp: 98.3 F (36.8 C) (01/04 0515) BP: 146/70 (01/04 0515) Pulse Rate: 78 (01/04 0515)  Labs: Recent Labs    05/30/19 0307 05/31/19 0424 06/01/19 0358  HGB 8.3* 8.8* 8.6*  HCT 25.8* 28.3* 27.3*  PLT 325 358 389  HEPARINUNFRC 0.34 0.52 0.58  CREATININE 2.60* 2.40* 2.38*    Estimated Creatinine Clearance: 31.3 mL/min (A) (by C-G formula based on SCr of 2.38 mg/dL (H)).   Assessment: 9 yoM with metastatic bladder cancer admitted with COVID gastroenteritis and AKI. D-dimer elevated and found to have bilateral DVT. Pharmacy requested to dose IV heparin infusion.   Baseline INR, aPTT: not done  Prior anticoagulation: enoxaparin 30 mg daily ppx; last dose 12/30 at 2216  Significant events: will not require percutaneous nephrostomy tubes now per urology  Today, 06/01/2019:  CBC: Hgb low but stable; Plt stable WNL, SCr 3.15 >> 2.38, Cl ~ 30 ml/min  heparin level therapeutic at 0.58 on 1700 units/hr  No bleeding or infusion issues per nursing  [facility] consulted for co-pays of Xarelto/Eliquis (Eliquis better choice with renal function)  Goal of Therapy: Heparin level 0.3-0.7 units/ml Monitor platelets by anticoagulation protocol: Yes  Plan: Continue heparin at 1,700 units/hr Monitor daily heparin level, CBC, s/s of bleed F/u for transition to DOAC  Minda Ditto PharmD 06/01/2019, 7:17 AM

## 2019-06-01 NOTE — TOC Benefit Eligibility Note (Signed)
Transition of Care [facility]) Benefit Eligibility Note    Patient Details  Name: RHYTHM WIGFALL MRN: [id number] Date of Birth: 04/16/1951   Medication/Dose: Eliquis 5mg  2x day for 7 days is and 10mg  daily for 30 days  Covered?: Yes  Tier: 3 Drug  Prescription Coverage Preferred Pharmacy: local pharmacy  Spoke with Person/Company/Phone Number:: Pamela/ [facility]  Co-Pay: 7day=$123.02 and 30 day supply $459.00  Prior Approval: No  Deductible: Unmet       Kerin Salen Phone Number: 06/01/2019, 5:06 PM

## 2019-06-01 NOTE — Progress Notes (Signed)
PROGRESS NOTE    Sergio Pennington  GHW:[id number] DOB: [birth year] DOA: 05/27/2019 PCP: Darreld Mclean, MD  Brief Narrative:  Per previous attending 69 y.o.malewith medical history significant ofbladder cancer status post urostomy, type 2 diabetes, hypertension, hyperlipidemia presenting to the ED for evaluation of generalized weakness.Symptoms started 2 weeks ago. He has had generalized weakness and fatigue. Intermittent episodes of nonbloody emesis and nonbloody diarrhea. He is having 3-5/10intensity generalized abdominal pain and intermittent fevers at home. He is coughing a little. Denies shortness of breath or chest pain. No additional history could be obtained from the patient as he started vomiting.  ED Course:Afebrile and hemodynamically stable. Oxygen saturation 97-100% on room air. SARS-CoV-2 rapid antigen test positive. WBC count 18.3. Lactic acid normal. Hemoglobin 8.9, ranging between 10.4-11 on recent labs. Sodium 133. BUN 67, creatinine 3.1. Creatinine ranging between 1.6-1.9 on recent labs. LFTs normal. UA with large amount of leukocytes, 21-50 RBCs, greater than 50 WBCs, and few bacteria. Urine culture pending. Blood culture x2 pending. C. difficile PCR and GI pathogen ordered ,Chest x-ray showing mild right basilar atelectasis and no focal consolidation. Patient received 1.5 L normal saline boluses. Patient is admitted for further management. Plan forDC home soon, urology plans for outpatient follow-up  Subjective:  Resting on RA, no CP/COUGH/SOB Foley w clear urine from rt urostomy  No new complaints Creatinine unchanged On NSS 75 ml/hr and heparin gtt, no plans for procedures from urologic standpoint   Assessment & Plan:   Principal Problem:   COVID-19 virus infection Active Problems:   UTI (urinary tract infection)   Abdominal pain, vomiting, and diarrhea   AKI (acute kidney injury) ([place])   Anemia   Viral gastroenteritis  suspected COVID-19 related/COVID-19 viral infection:CT abdomen showed mild to moderate hydronephrosis.  Work-up was negative for C. difficile, GI PCR panel negative.  X-ray on admission mild right basilar atelectasis otherwise no acute cardiopulmonary disease.  Respiratory status is stable, no tachypnea and not hypoxic.  Continue supportive care-antiemetics, clear liquid diet and advance as tolerated.  COVID-19 antigen positive: D-dimer greater than 20, ferritin 586, LDH 150, CRP 20.3.   Holding off any steroid remdesivir other treatment continue with supportive care.  Continue vitamin C and zinc, airborne precaution.  Monitor closely.  Proteus/Klebsiella urinary tract infection: Urostomy in place.  WBC overall still elevated.  Continue on ceftriaxone.  Ostomy consult for urostomy care - Continue augmentin  Leukocytosis : In the setting of UTI. WBC is downtrending slightly up this morning.  Monitor.  Patient is afebrile.  Blood culture is negative so far. - Continue augmentin  AKI/obstructive uropathy with bilateral hydronephrosis right more than left: Creatinine on admission 3.15 with a baseline 1.2-1.5 for the past year. Etiology likely secondary to volume loss with acute gastroenteritis/dehydration versus concern for urinary obstruction with fibrosis. Recent bladder resection with ileostomy by urology Dr. Tresa Moore August 2020Followed by urology.  Urostomy in place and draining well - Creatinine stable, hoping it will continue to down trend as time passes  Metabolic acidosis with bicarb 19 in the setting of renal failure.  Continue fluids and monitor.  Acute bilateral lower extremity DVT: on right common femoral, right femoral, left common femoral, left femoral, left popliteal, left posterior tibial veins and left gastrocnemius vein. continue heparin drip. transition to NOAC per oncology once off drip.  Continue to monitor patient closely and hopefully can transition once creatinine improves and  no need for percutaneous nephrostomy. - Plan for 5mg  bid eliquis tomorrow, can likely afford  Anemia surgical likely from chronic disease: Stable.  Monitor.  FOBT is negative.  Physical deconditioning/debility: Secondary to UTI acute viral illness.  Continue PT OT.  PT recommends home health on discharge.  Orders have been placed.  Bladder cancer hx Follows with medical oncology, Dr. Alen Blew. S/P cystectomy with ileal conduit diversion on 01/15/2019 by Dr. Tresa Moore with urology. CT abdomen/pelvis with possible progression of metastatic disease process. earlier MD discussed with Dr. Alen Blew; he will follow patient up closely following discharge for further recommendations  NIDDM A1c 6.4 continue sliding scale insulin and monitor  Mild hyponatremia from GI loss.  Resolved. Continue IV fluids.  Body mass index is 22.92 kg/m.    DVT prophylaxis:Heparin Code Status: full code Family Communication: plan of care discussed with patient at bedside. Disposition Plan: Remains inpatient pending clinical improvement in renal function.  Home health once stable for discharge  Consultants:   Urology - Dr. Claudia Desanctis, Dr. Tresa Moore  Oncology - Dr. Alen Blew; discussed plan of care over telephone on 05/28/2019  Procedures: none Microbiology: Proteus and Klebsiella in urine culture  Antimicrobials: Augmentin  Objective: Vitals:   05/31/19 1236 05/31/19 2019 06/01/19 0515 06/01/19 1517  BP: (!) 147/73 (!) 153/70 (!) 146/70 (!) 147/74  Pulse: 78 74 78 82  Resp: 18 18 18 18   Temp: 98.5 F (36.9 C) 98 F (36.7 C) 98.3 F (36.8 C) 98.3 F (36.8 C)  TempSrc: Oral     SpO2:  97% 97% 99%  Weight:      Height:        Intake/Output Summary (Last 24 hours) at 06/01/2019 1712 Last data filed at 06/01/2019 1513 Gross per 24 hour  Intake 3054.94 ml  Output 1450 ml  Net 1604.94 ml   Filed Weights   05/27/19 1846  Weight: 76.6 kg    Examination:  General exam: Appears calm and comfortable    Respiratory system: Clear to auscultation. Respiratory effort normal. Cardiovascular system: S1 & S2 heard, RRR.  Gastrointestinal system: Abdomen is nondistended, soft and nontender. No organomegaly or masses felt. Normal bowel sounds heard. Nephrostomy tube clean and intact Central nervous system: Alert and oriented. No focal neurological deficits. Extremities: Symmetric 5 x 5 power. Skin: No rashes, lesions or ulcers Psychiatry: Judgement and insight appear normal. Mood & affect appropriate.     Data Reviewed: I have personally reviewed following labs and imaging studies  CBC: Recent Labs  Lab 05/28/19 0940 05/29/19 0248 05/30/19 0307 05/31/19 0424 06/01/19 0358  WBC 17.3* 15.7* 15.0* 16.7* 17.7*  NEUTROABS 14.1*  --   --   --   --   HGB 9.1* 9.0* 8.3* 8.8* 8.6*  HCT 27.7* 28.4* 25.8* 28.3* 27.3*  MCV 93.9 94.7 92.5 94.0 93.8  PLT 268 311 325 358 498   Basic Metabolic Panel: Recent Labs  Lab 05/28/19 0940 05/29/19 0248 05/30/19 0307 05/31/19 0424 06/01/19 0358  NA 134* 136 133* 137 137  K 4.7 4.5 4.5 4.5 4.6  CL 100 104 103 109 110  CO2 20* 20* 19* 19* 18*  GLUCOSE 133* 117* 139* 170* 134*  BUN 68* 60* 53* 49* 45*  CREATININE 3.01* 3.02* 2.60* 2.40* 2.38*  CALCIUM 8.3* 8.5* 8.1* 8.4* 8.6*  MG  --  2.0  --   --   --    GFR: Estimated Creatinine Clearance: 31.3 mL/min (A) (by C-G formula based on SCr of 2.38 mg/dL (H)). Liver Function Tests: Recent Labs  Lab 05/27/19 1923  AST 19  ALT 22  ALKPHOS  72  BILITOT 0.5  PROT 6.4*  ALBUMIN 2.8*   No results for input(s): LIPASE, AMYLASE in the last 168 hours. No results for input(s): AMMONIA in the last 168 hours. Coagulation Profile: No results for input(s): INR, PROTIME in the last 168 hours. Cardiac Enzymes: No results for input(s): CKTOTAL, CKMB, CKMBINDEX, TROPONINI in the last 168 hours. BNP (last 3 results) No results for input(s): PROBNP in the last 8760 hours. HbA1C: No results for input(s):  HGBA1C in the last 72 hours. CBG: Recent Labs  Lab 05/31/19 1716 05/31/19 2010 06/01/19 0819 06/01/19 1126 06/01/19 1633  GLUCAP 125* 179* 109* 152* 104*   Lipid Profile: No results for input(s): CHOL, HDL, LDLCALC, TRIG, CHOLHDL, LDLDIRECT in the last 72 hours. Thyroid Function Tests: No results for input(s): TSH, T4TOTAL, FREET4, T3FREE, THYROIDAB in the last 72 hours. Anemia Panel: No results for input(s): VITAMINB12, FOLATE, FERRITIN, TIBC, IRON, RETICCTPCT in the last 72 hours. Sepsis Labs: Recent Labs  Lab 05/27/19 1920 05/27/19 1923  PROCALCITON 0.96  --   LATICACIDVEN  --  0.9    Recent Results (from the past 240 hour(s))  Blood Culture x 1     Status: None (Preliminary result)   Collection Time: 05/27/19  7:23 PM   Specimen: BLOOD RIGHT FOREARM  Result Value Ref Range Status   Specimen Description   Final    BLOOD RIGHT FOREARM BOTTLES DRAWN AEROBIC AND ANAEROBIC Blood Culture results may not be optimal due to an inadequate volume of blood received in culture bottles Performed at [facility], [place] [address], [place], [place] [address]    Special Requests   Final    NONE Performed at [facility], [place] [address] [place], [place] [address]    Culture   Final    NO GROWTH 4 DAYS Performed at [facility], [place] [address] [place], [place] [address]    Report Status PENDING  Incomplete  Urine culture     Status: Abnormal   Collection Time: 05/27/19  7:23 PM   Specimen: Urine, Clean Catch  Result Value Ref Range Status   Specimen Description   Final    URINE, CLEAN CATCH Performed at [facility], [place] [address] [place], [place] [address]    Special Requests   Final    NONE Performed at [facility], [place] [address] [place], [place] [address]    Culture (A)  Final    90,000 COLONIES/mL PROTEUS MIRABILIS >=100,000 COLONIES/mL KLEBSIELLA PNEUMONIAE    Report Status  05/31/2019 FINAL  Final   Organism ID, Bacteria PROTEUS MIRABILIS (A)  Final   Organism ID, Bacteria KLEBSIELLA PNEUMONIAE (A)  Final      Susceptibility   Klebsiella pneumoniae - MIC*    AMPICILLIN >=32 RESISTANT Resistant     CEFAZOLIN <=4 SENSITIVE Sensitive     CEFTRIAXONE <=0.25 SENSITIVE Sensitive     CIPROFLOXACIN <=0.25 SENSITIVE Sensitive     GENTAMICIN <=1 SENSITIVE Sensitive     IMIPENEM <=0.25 SENSITIVE Sensitive     NITROFURANTOIN 128 RESISTANT Resistant     TRIMETH/SULFA <=20 SENSITIVE Sensitive     AMPICILLIN/SULBACTAM 8 SENSITIVE Sensitive     PIP/TAZO <=4 SENSITIVE Sensitive     * >=100,000 COLONIES/mL KLEBSIELLA PNEUMONIAE   Proteus mirabilis - MIC*    AMPICILLIN <=2 SENSITIVE Sensitive     CEFAZOLIN <=4 SENSITIVE Sensitive     CEFTRIAXONE <=0.25 SENSITIVE Sensitive     CIPROFLOXACIN <=0.25 SENSITIVE  Sensitive     GENTAMICIN <=1 SENSITIVE Sensitive     IMIPENEM 2 SENSITIVE Sensitive     NITROFURANTOIN 128 RESISTANT Resistant     TRIMETH/SULFA <=20 SENSITIVE Sensitive     AMPICILLIN/SULBACTAM <=2 SENSITIVE Sensitive     PIP/TAZO <=4 SENSITIVE Sensitive     * 90,000 COLONIES/mL PROTEUS MIRABILIS  GI pathogen panel by PCR, stool     Status: None   Collection Time: 05/27/19  9:32 PM   Specimen: Stool  Result Value Ref Range Status   Plesiomonas shigelloides NOT DETECTED NOT DETECTED Final   Yersinia enterocolitica NOT DETECTED NOT DETECTED Final   Vibrio NOT DETECTED NOT DETECTED Final   Enteropathogenic E coli NOT DETECTED NOT DETECTED Final   E coli (ETEC) LT/ST NOT DETECTED NOT DETECTED Final   E coli 2831 by PCR Not applicable NOT DETECTED Final   Cryptosporidium by PCR NOT DETECTED NOT DETECTED Final   Entamoeba histolytica NOT DETECTED NOT DETECTED Final   Adenovirus F 40/41 NOT DETECTED NOT DETECTED Final   Norovirus GI/GII NOT DETECTED NOT DETECTED Final   Sapovirus NOT DETECTED NOT DETECTED Final    Comment: (NOTE) Performed At: [facility] [place], [place] [id number] Rush Farmer MD [id number]    Vibrio cholerae NOT DETECTED NOT DETECTED Final   Campylobacter by PCR NOT DETECTED NOT DETECTED Final   Salmonella by PCR NOT DETECTED NOT DETECTED Final   E coli (STEC) NOT DETECTED NOT DETECTED Final   Enteroaggregative E coli NOT DETECTED NOT DETECTED Final   Shigella by PCR NOT DETECTED NOT DETECTED Final   Cyclospora cayetanensis NOT DETECTED NOT DETECTED Final   Astrovirus NOT DETECTED NOT DETECTED Final   G lamblia by PCR NOT DETECTED NOT DETECTED Final   Rotavirus A by PCR NOT DETECTED NOT DETECTED Final  C Difficile Quick Screen w PCR reflex     Status: None   Collection Time: 05/27/19  9:32 PM   Specimen: Stool  Result Value Ref Range Status   C Diff antigen NEGATIVE NEGATIVE Final   C Diff toxin NEGATIVE NEGATIVE Final   C Diff interpretation No C. difficile detected.  Final    Comment: Performed at [facility], [place] [address] [place], [place] [address]         Radiology Studies: No results found.      Scheduled Meds: . vitamin C  500 mg Oral BID  . Chlorhexidine Gluconate Cloth  6 each Topical Daily  . cholecalciferol  2,000 Units Oral Daily  . insulin aspart  0-5 Units Subcutaneous QHS  . insulin aspart  0-9 Units Subcutaneous TID WC  . zinc sulfate  220 mg Oral Daily   Continuous Infusions: . cefTRIAXone (ROCEPHIN)  IV Stopped (05/31/19 2244)  . heparin 1,700 Units/hr (06/01/19 1513)     LOS: 5 days    Time spent: 30 minutes  Arma Heading, MD [facility] Pager [phone]  If 7PM-7AM, please contact night-coverage [email or web address] Password [facility] 06/01/2019, 5:12 PM

## 2019-06-02 LAB — PROTIME-INR
INR: 1.1 (ref 0.8–1.2)
Prothrombin Time: 14 seconds (ref 11.4–15.2)

## 2019-06-02 LAB — HEPARIN LEVEL (UNFRACTIONATED): Heparin Unfractionated: 0.55 IU/mL (ref 0.30–0.70)

## 2019-06-02 LAB — BASIC METABOLIC PANEL
Anion gap: 7 (ref 5–15)
Anion gap: 10 (ref 5–15)
BUN: 26 mg/dL — ABNORMAL HIGH (ref 8–23)
BUN: 46 mg/dL — ABNORMAL HIGH (ref 8–23)
CO2: 24 mmol/L (ref 22–32)
CO2: 21 mmol/L — ABNORMAL LOW (ref 22–32)
Calcium: 9.1 mg/dL (ref 8.9–10.3)
Calcium: 8.9 mg/dL (ref 8.9–10.3)
Chloride: 108 mmol/L (ref 98–111)
Chloride: 106 mmol/L (ref 98–111)
Creatinine, Ser: 0.8 mg/dL (ref 0.61–1.24)
Creatinine, Ser: 2.33 mg/dL — ABNORMAL HIGH (ref 0.61–1.24)
GFR calc Af Amer: >60 mL/min (ref >60)
GFR calc Af Amer: 32 mL/min — ABNORMAL LOW (ref >60)
GFR calc non Af Amer: >60 mL/min (ref >60)
GFR calc non Af Amer: 27 mL/min — ABNORMAL LOW (ref >60)
Glucose, Bld: 128 mg/dL — ABNORMAL HIGH (ref 70–99)
Glucose, Bld: 137 mg/dL — ABNORMAL HIGH (ref 70–99)
Potassium: 4 mmol/L (ref 3.5–5.1)
Potassium: 4.9 mmol/L (ref 3.5–5.1)
Sodium: 139 mmol/L (ref 135–145)
Sodium: 137 mmol/L (ref 135–145)

## 2019-06-02 LAB — GLUCOSE, CAPILLARY
Glucose-Capillary: 130 mg/dL — ABNORMAL HIGH (ref 70–99)
Glucose-Capillary: 102 mg/dL — ABNORMAL HIGH (ref 70–99)
Glucose-Capillary: 112 mg/dL — ABNORMAL HIGH (ref 70–99)
Glucose-Capillary: 119 mg/dL — ABNORMAL HIGH (ref 70–99)

## 2019-06-02 LAB — CBC
HCT: 26.4 % — ABNORMAL LOW (ref 39.0–52.0)
Hemoglobin: 8.6 g/dL — ABNORMAL LOW (ref 13.0–17.0)
MCH: 30.8 pg (ref 26.0–34.0)
MCHC: 32.6 g/dL (ref 30.0–36.0)
MCV: 94.6 fL (ref 80.0–100.0)
Platelets: 366 10*3/uL (ref 150–400)
RBC: 2.79 MIL/uL — ABNORMAL LOW (ref 4.22–5.81)
RDW: 14.2 % (ref 11.5–15.5)
WBC: 18.3 10*3/uL — ABNORMAL HIGH (ref 4.0–10.5)
nRBC: 0.1 % (ref 0.0–0.2)

## 2019-06-02 LAB — CULTURE, BLOOD (SINGLE): Culture: NO GROWTH

## 2019-06-02 MED ORDER — AMOXICILLIN-POT CLAVULANATE 875-125 MG PO TABS
1.0000 | ORAL_TABLET | Freq: Two times a day (BID) | ORAL | Status: DC
  Administered 2019-06-02 – 2019-06-05 (×7): 1 via ORAL
  Filled 2019-06-02 (×7): qty 1

## 2019-06-02 MED ORDER — POLYETHYLENE GLYCOL 3350 17 G PO PACK
17.0000 g | PACK | Freq: Every day | ORAL | Status: DC
  Administered 2019-06-02 – 2019-06-05 (×4): 17 g via ORAL
  Filled 2019-06-02 (×4): qty 1

## 2019-06-02 MED ORDER — WARFARIN - PHARMACIST DOSING INPATIENT: Freq: Every day | Status: DC

## 2019-06-02 MED ORDER — COUMADIN BOOK
Freq: Once | Status: AC
  Filled 2019-06-02: qty 1

## 2019-06-02 MED ORDER — WARFARIN SODIUM 5 MG PO TABS
7.5000 mg | ORAL_TABLET | Freq: Once | ORAL | Status: AC
  Administered 2019-06-02: 18:00:00 7.5 mg via ORAL
  Filled 2019-06-02: qty 1

## 2019-06-02 MED ORDER — APIXABAN 5 MG PO TABS: 5.0000 mg | ORAL_TABLET | Freq: Two times a day (BID) | ORAL | Status: DC

## 2019-06-02 NOTE — Progress Notes (Signed)
[place] for Heparin Indication: Bilateral DVT  Allergies  Allergen Reactions  . Sulfa Antibiotics Hives   Patient Measurements: Height: 6' (182.9 cm) Weight: 168 lb 15.7 oz (76.6 kg) IBW/kg (Calculated) : 77.6 Heparin Dosing Weight: TBW  Vital Signs: Temp: 98 F (36.7 C) (01/05 0428) Temp Source: Oral (01/05 0428) BP: 159/76 (01/05 0428) Pulse Rate: 80 (01/05 0428)  Labs: Recent Labs    05/31/19 0424 06/01/19 0358 06/02/19 0343  HGB 8.8* 8.6* 8.6*  HCT 28.3* 27.3* 26.4*  PLT 358 389 366  HEPARINUNFRC 0.52 0.58  --   CREATININE 2.40* 2.38*  --     Estimated Creatinine Clearance: 31.3 mL/min (A) (by C-G formula based on SCr of 2.38 mg/dL (H)).   Assessment: 1 yoM with metastatic bladder cancer admitted with COVID gastroenteritis and AKI. D-dimer elevated and found to have bilateral DVT. Pharmacy requested to dose IV heparin infusion.   Baseline INR, aPTT: not done  Prior anticoagulation: enoxaparin 30 mg daily ppx; last dose 12/30 at 2216  Significant events: will not require percutaneous nephrostomy tubes now per urology  Today, 06/02/2019:  CBC: Hgb low but stable; Plt stable WNL, SCr improving slowly  Daily heparin level remains therapeutic on 1700 units/hr  No bleeding or infusion issues per nursing  [facility] consulted for co-pays of Xarelto/Eliquis (Eliquis better choice with renal function)  Goal of Therapy: Heparin level 0.3-0.7 units/ml Monitor platelets by anticoagulation protocol: Yes  Plan:  Continue heparin at 1700 units/hr  Monitor daily heparin level, CBC, s/s of bleed  F/u for transition to [place], PharmD, BCPS [phone] 06/02/2019, 4:52 AM

## 2019-06-02 NOTE — Progress Notes (Signed)
[place] for Heparin & Warfarin added 1/5 Indication: Bilateral DVT  Allergies  Allergen Reactions  . Sulfa Antibiotics Hives   Patient Measurements: Height: 6' (182.9 cm) Weight: 168 lb 15.7 oz (76.6 kg) IBW/kg (Calculated) : 77.6 Heparin Dosing Weight: TBW  Vital Signs: Temp: 98 F (36.7 C) (01/05 0428) Temp Source: Oral (01/05 0428) BP: 159/76 (01/05 0428) Pulse Rate: 80 (01/05 0428)  Labs: Recent Labs    05/31/19 0424 06/01/19 0358 06/02/19 0343 06/02/19 0803 06/02/19 1143 06/02/19 1146  HGB 8.8* 8.6* 8.6*  --   --   --   HCT 28.3* 27.3* 26.4*  --   --   --   PLT 358 389 366  --   --   --   LABPROT  --   --   --   --  14.0  --   INR  --   --   --   --  1.1  --   HEPARINUNFRC 0.52 0.58 0.55  --   --   --   CREATININE 2.40* 2.38*  --  0.80  --  2.33*    Estimated Creatinine Clearance: 32 mL/min (A) (by C-G formula based on SCr of 2.33 mg/dL (H)).   Assessment: 36 yoM with metastatic bladder cancer admitted with COVID gastroenteritis and AKI. D-dimer elevated and found to have bilateral DVT. Pharmacy requested to dose IV heparin infusion.   Baseline INR, aPTT: not done  Prior anticoagulation: enoxaparin 30 mg daily ppx; last dose 12/30 at 2216  Significant events: will not require percutaneous nephrostomy tubes now per urology  Today, 06/02/2019:  CBC: Hgb low but stable; Plt stable WNL, SCr improving slowly  Daily heparin level remains therapeutic on 1700 units/hr  No bleeding or infusion issues per nursing  [facility] consulted for co-pays of Xarelto/Eliquis (Eliquis better choice with renal function) but patient cannot afford  Begin Warfarin  Goal of Therapy: Heparin level 0.3-0.7 units/ml Monitor platelets by anticoagulation protocol: Yes  Plan:  Continue heparin at 1700 units/hr  Monitor daily heparin level, CBC, daily PT/INR, s/s of bleed  Transition to Warfarin witih 7.5mg  today at 1800  Coumadin book  ordered, counseled by phone today 1/5  Minda Ditto PharmD 06/02/2019, 2:41 PM

## 2019-06-02 NOTE — Progress Notes (Addendum)
PROGRESS NOTE    Sergio Pennington  UXN:[id number] DOB: 1951-04-12 DOA: 05/27/2019 PCP: Darreld Mclean, MD  Brief Narrative:  Per previous attending 69 y.o.malewith medical history significant ofbladder cancer status post urostomy, type 2 diabetes, hypertension, hyperlipidemia presenting to the ED for evaluation of generalized weakness.Symptoms started 2 weeks ago. He has had generalized weakness and fatigue. Intermittent episodes of nonbloody emesis and nonbloody diarrhea. He is having 3-5/10intensity generalized abdominal pain and intermittent fevers at home. He is coughing a little. Denies shortness of breath or chest pain. No additional history could be obtained from the patient as he started vomiting.  ED Course:Afebrile and hemodynamically stable. Oxygen saturation 97-100% on room air. SARS-CoV-2 rapid antigen test positive. WBC count 18.3. Lactic acid normal. Hemoglobin 8.9, ranging between 10.4-11 on recent labs. Sodium 133. BUN 67, creatinine 3.1. Creatinine ranging between 1.6-1.9 on recent labs. LFTs normal. UA with large amount of leukocytes, 21-50 RBCs, greater than 50 WBCs, and few bacteria. Urine culture pending. Blood culture x2 pending. C. difficile PCR and GI pathogen ordered ,Chest x-ray showing mild right basilar atelectasis and no focal consolidation. Patient received 1.5 L normal saline boluses. Patient is admitted for further management. Plan forDC home soon, urology plans for outpatient follow-up Starting warfarin 1/5  Subjective:  Resting comfortably although did have some vomiting with breakfast. Does not like the food. Still hasn't had a bowel movement. Will give miralax as he takes this at home. Cannot afford outpatient copay for eliquis so will transition to warfarin  Assessment & Plan:   Principal Problem:   COVID-19 virus infection Active Problems:   UTI (urinary tract infection)   Abdominal pain, vomiting, and  diarrhea   AKI (acute kidney injury) ([place])   Anemia   Viral gastroenteritis suspected COVID-19 related/COVID-19 viral infection:CT abdomen showed mild to moderate hydronephrosis.  Work-up was negative for C. difficile, GI PCR panel negative.  X-ray on admission mild right basilar atelectasis otherwise no acute cardiopulmonary disease.  Respiratory status is stable, no tachypnea and not hypoxic.  Continue supportive care-antiemetics, clear liquid diet and advance as tolerated.  COVID-19 antigen positive: D-dimer greater than 20, ferritin 586, LDH 150, CRP 20.3.   Holding off any steroid remdesivir other treatment continue with supportive care.  Continue vitamin C and zinc, airborne precaution.  Monitor closely.  Proteus/Klebsiella urinary tract infection: Urostomy in place.  WBC overall still elevated.  Continue on ceftriaxone.  Ostomy consult for urostomy care - Continue augmentin  Leukocytosis : In the setting of UTI. WBC is downtrending slightly up this morning.  Monitor.  Patient is afebrile.  Blood culture is negative so far. - Continue augmentin  AKI/obstructive uropathy with bilateral hydronephrosis right more than left: Creatinine on admission 3.15 with a baseline 1.2-1.5 for the past year. Etiology likely secondary to volume loss with acute gastroenteritis/dehydration versus concern for urinary obstruction with fibrosis. Recent bladder resection with ileostomy by urology Dr. Tresa Moore August 2020Followed by urology.  Urostomy in place and draining well - Creatinine stable, hoping it will continue to down trend as time passes - Continue to monitor  Metabolic acidosis with bicarb 21 in the setting of CKD. Monitor  Acute bilateral lower extremity DVT: on right common femoral, right femoral, left common femoral, left femoral, left popliteal, left posterior tibial veins and left gastrocnemius vein. continue heparin drip. transition to NOAC per oncology once off drip.  Continue to monitor  patient closely and hopefully can transition once creatinine improves and no need for percutaneous nephrostomy. -Warfarin  w/ hep gtt bridge, dialy INR  Anemia surgical likely from chronic disease: Stable.  Monitor.  FOBT is negative.  Physical deconditioning/debility: Secondary to UTI acute viral illness.  Continue PT OT.  PT recommends home health on discharge.  Orders have been placed.  Bladder cancer hx Follows with medical oncology, Dr. Alen Blew. S/P cystectomy with ileal conduit diversion on 01/15/2019 by Dr. Tresa Moore with urology. CT abdomen/pelvis with possible progression of metastatic disease process. earlier MD discussed with Dr. Alen Blew; he will follow patient up closely following discharge for further recommendations  NIDDM A1c 6.4 continue sliding scale insulin and monitor  Mild hyponatremia from GI loss.  Resolved. Continue IV fluids.  Body mass index is 22.92 kg/m.    DVT prophylaxis:Heparin Code Status: full code Family Communication: plan of care discussed with patient at bedside. Disposition Plan: Remains inpatient pending clinical improvement in renal function.  Home health once stable for discharge  Consultants:   Urology - Dr. Claudia Desanctis, Dr. Tresa Moore  Oncology - Dr. Alen Blew; discussed plan of care over telephone on 05/28/2019  Procedures: none Microbiology: Proteus and Klebsiella in urine culture  Antimicrobials: Augmentin  Objective: Vitals:   06/01/19 0515 06/01/19 1517 06/01/19 2003 06/02/19 0428  BP: (!) 146/70 (!) 147/74 (!) 167/78 (!) 159/76  Pulse: 78 82 92 80  Resp: 18 18 18 18   Temp: 98.3 F (36.8 C) 98.3 F (36.8 C) 98.4 F (36.9 C) 98 F (36.7 C)  TempSrc:   Oral Oral  SpO2: 97% 99% 97% 97%  Weight:      Height:        Intake/Output Summary (Last 24 hours) at 06/02/2019 1433 Last data filed at 06/02/2019 1610 Gross per 24 hour  Intake 2025.74 ml  Output 1800 ml  Net 225.74 ml   Filed Weights   05/27/19 1846  Weight: 76.6 kg     Examination:  General exam: Appears calm and comfortable , exam otherwise unchanged Respiratory system: Clear to auscultation. Respiratory effort normal. Cardiovascular system: S1 & S2 heard, RRR.  Gastrointestinal system: Abdomen is nondistended, passes gas, soft and nontender. No organomegaly or masses felt. Normal bowel sounds heard. Nephrostomy tube clean and intact Central nervous system: Alert and oriented. No focal neurological deficits. Extremities: Symmetric 5 x 5 power. Skin: No rashes, lesions or ulcers Psychiatry: Judgement and insight appear normal. Mood & affect appropriate.     Data Reviewed: I have personally reviewed following labs and imaging studies  CBC: Recent Labs  Lab 05/28/19 0940 05/29/19 0248 05/30/19 0307 05/31/19 0424 06/01/19 0358 06/02/19 0343  WBC 17.3* 15.7* 15.0* 16.7* 17.7* 18.3*  NEUTROABS 14.1*  --   --   --   --   --   HGB 9.1* 9.0* 8.3* 8.8* 8.6* 8.6*  HCT 27.7* 28.4* 25.8* 28.3* 27.3* 26.4*  MCV 93.9 94.7 92.5 94.0 93.8 94.6  PLT 268 311 325 358 389 960   Basic Metabolic Panel: Recent Labs  Lab 05/29/19 0248 05/30/19 0307 05/31/19 0424 06/01/19 0358 06/02/19 0803 06/02/19 1146  NA 136 133* 137 137 139 137  K 4.5 4.5 4.5 4.6 4.0 4.9  CL 104 103 109 110 108 106  CO2 20* 19* 19* 18* 24 21*  GLUCOSE 117* 139* 170* 134* 128* 137*  BUN 60* 53* 49* 45* 26* 46*  CREATININE 3.02* 2.60* 2.40* 2.38* 0.80 2.33*  CALCIUM 8.5* 8.1* 8.4* 8.6* 9.1 8.9  MG 2.0  --   --   --   --   --  GFR: Estimated Creatinine Clearance: 32 mL/min (A) (by C-G formula based on SCr of 2.33 mg/dL (H)). Liver Function Tests: Recent Labs  Lab 05/27/19 1923  AST 19  ALT 22  ALKPHOS 72  BILITOT 0.5  PROT 6.4*  ALBUMIN 2.8*   No results for input(s): LIPASE, AMYLASE in the last 168 hours. No results for input(s): AMMONIA in the last 168 hours. Coagulation Profile: Recent Labs  Lab 06/02/19 1143  INR 1.1   Cardiac Enzymes: No results for  input(s): CKTOTAL, CKMB, CKMBINDEX, TROPONINI in the last 168 hours. BNP (last 3 results) No results for input(s): PROBNP in the last 8760 hours. HbA1C: No results for input(s): HGBA1C in the last 72 hours. CBG: Recent Labs  Lab 06/01/19 1126 06/01/19 1633 06/01/19 2006 06/02/19 0729 06/02/19 1135  GLUCAP 152* 104* 128* 130* 102*   Lipid Profile: No results for input(s): CHOL, HDL, LDLCALC, TRIG, CHOLHDL, LDLDIRECT in the last 72 hours. Thyroid Function Tests: No results for input(s): TSH, T4TOTAL, FREET4, T3FREE, THYROIDAB in the last 72 hours. Anemia Panel: No results for input(s): VITAMINB12, FOLATE, FERRITIN, TIBC, IRON, RETICCTPCT in the last 72 hours. Sepsis Labs: Recent Labs  Lab 05/27/19 1920 05/27/19 1923  PROCALCITON 0.96  --   LATICACIDVEN  --  0.9    Recent Results (from the past 240 hour(s))  Blood Culture x 1     Status: None   Collection Time: 05/27/19  7:23 PM   Specimen: BLOOD RIGHT FOREARM  Result Value Ref Range Status   Specimen Description   Final    BLOOD RIGHT FOREARM BOTTLES DRAWN AEROBIC AND ANAEROBIC Blood Culture results may not be optimal due to an inadequate volume of blood received in culture bottles Performed at [facility], [place] [address], [place], [place] [address]    Special Requests   Final    NONE Performed at [facility], [place] [address], [place], [place] [address]    Culture   Final    NO GROWTH 5 DAYS Performed at [facility], [place] [address], [place], [place] [address]    Report Status 06/02/2019 FINAL  Final  Urine culture     Status: Abnormal   Collection Time: 05/27/19  7:23 PM   Specimen: Urine, Clean Catch  Result Value Ref Range Status   Specimen Description   Final    URINE, CLEAN CATCH Performed at [facility], [place] [address], [place], [place] [address]    Special Requests   Final    NONE Performed at [facility], [place]  [address] [place], [place] [address]    Culture (A)  Final    90,000 COLONIES/mL PROTEUS MIRABILIS >=100,000 COLONIES/mL KLEBSIELLA PNEUMONIAE    Report Status 05/31/2019 FINAL  Final   Organism ID, Bacteria PROTEUS MIRABILIS (A)  Final   Organism ID, Bacteria KLEBSIELLA PNEUMONIAE (A)  Final      Susceptibility   Klebsiella pneumoniae - MIC*    AMPICILLIN >=32 RESISTANT Resistant     CEFAZOLIN <=4 SENSITIVE Sensitive     CEFTRIAXONE <=0.25 SENSITIVE Sensitive     CIPROFLOXACIN <=0.25 SENSITIVE Sensitive     GENTAMICIN <=1 SENSITIVE Sensitive     IMIPENEM <=0.25 SENSITIVE Sensitive     NITROFURANTOIN 128 RESISTANT Resistant     TRIMETH/SULFA <=20 SENSITIVE Sensitive     AMPICILLIN/SULBACTAM 8 SENSITIVE Sensitive     PIP/TAZO <=4 SENSITIVE Sensitive     * >=100,000 COLONIES/mL KLEBSIELLA PNEUMONIAE   Proteus  mirabilis - MIC*    AMPICILLIN <=2 SENSITIVE Sensitive     CEFAZOLIN <=4 SENSITIVE Sensitive     CEFTRIAXONE <=0.25 SENSITIVE Sensitive     CIPROFLOXACIN <=0.25 SENSITIVE Sensitive     GENTAMICIN <=1 SENSITIVE Sensitive     IMIPENEM 2 SENSITIVE Sensitive     NITROFURANTOIN 128 RESISTANT Resistant     TRIMETH/SULFA <=20 SENSITIVE Sensitive     AMPICILLIN/SULBACTAM <=2 SENSITIVE Sensitive     PIP/TAZO <=4 SENSITIVE Sensitive     * 90,000 COLONIES/mL PROTEUS MIRABILIS  GI pathogen panel by PCR, stool     Status: None   Collection Time: 05/27/19  9:32 PM   Specimen: Stool  Result Value Ref Range Status   Plesiomonas shigelloides NOT DETECTED NOT DETECTED Final   Yersinia enterocolitica NOT DETECTED NOT DETECTED Final   Vibrio NOT DETECTED NOT DETECTED Final   Enteropathogenic E coli NOT DETECTED NOT DETECTED Final   E coli (ETEC) LT/ST NOT DETECTED NOT DETECTED Final   E coli 3818 by PCR Not applicable NOT DETECTED Final   Cryptosporidium by PCR NOT DETECTED NOT DETECTED Final   Entamoeba histolytica NOT DETECTED NOT DETECTED Final   Adenovirus F 40/41 NOT DETECTED NOT  DETECTED Final   Norovirus GI/GII NOT DETECTED NOT DETECTED Final   Sapovirus NOT DETECTED NOT DETECTED Final    Comment: (NOTE) Performed At: [facility] [place], [place] [id number] Rush Farmer MD VE:[id number]    Vibrio cholerae NOT DETECTED NOT DETECTED Final   Campylobacter by PCR NOT DETECTED NOT DETECTED Final   Salmonella by PCR NOT DETECTED NOT DETECTED Final   E coli (STEC) NOT DETECTED NOT DETECTED Final   Enteroaggregative E coli NOT DETECTED NOT DETECTED Final   Shigella by PCR NOT DETECTED NOT DETECTED Final   Cyclospora cayetanensis NOT DETECTED NOT DETECTED Final   Astrovirus NOT DETECTED NOT DETECTED Final   G lamblia by PCR NOT DETECTED NOT DETECTED Final   Rotavirus A by PCR NOT DETECTED NOT DETECTED Final  C Difficile Quick Screen w PCR reflex     Status: None   Collection Time: 05/27/19  9:32 PM   Specimen: Stool  Result Value Ref Range Status   C Diff antigen NEGATIVE NEGATIVE Final   C Diff toxin NEGATIVE NEGATIVE Final   C Diff interpretation No C. difficile detected.  Final    Comment: Performed at [facility], [place] [address], [place], [place] [address]         Radiology Studies: No results found.      Scheduled Meds:  vitamin C  500 mg Oral BID   Chlorhexidine Gluconate Cloth  6 each Topical Daily   cholecalciferol  2,000 Units Oral Daily   coumadin book   Does not apply Once   insulin aspart  0-5 Units Subcutaneous QHS   insulin aspart  0-9 Units Subcutaneous TID WC   polyethylene glycol  17 g Oral Daily   zinc sulfate  220 mg Oral Daily   Continuous Infusions:  cefTRIAXone (ROCEPHIN)  IV 1 g (06/01/19 2015)   heparin 1,700 Units/hr (06/02/19 0605)     LOS: 6 days    Time spent: 25 minutes  Arma Heading, MD [facility] Pager [phone]  If 7PM-7AM, please contact night-coverage [email or web address] Password [email or web address] 06/02/2019, 2:33 PM

## 2019-06-03 LAB — CBC
HCT: 28.7 % — ABNORMAL LOW (ref 39.0–52.0)
Hemoglobin: 9 g/dL — ABNORMAL LOW (ref 13.0–17.0)
MCH: 30.1 pg (ref 26.0–34.0)
MCHC: 31.4 g/dL (ref 30.0–36.0)
MCV: 96 fL (ref 80.0–100.0)
Platelets: 412 10*3/uL — ABNORMAL HIGH (ref 150–400)
RBC: 2.99 MIL/uL — ABNORMAL LOW (ref 4.22–5.81)
RDW: 14.6 % (ref 11.5–15.5)
WBC: 18.1 10*3/uL — ABNORMAL HIGH (ref 4.0–10.5)
nRBC: 0 % (ref 0.0–0.2)

## 2019-06-03 LAB — PROTIME-INR
INR: 1.3 — ABNORMAL HIGH (ref 0.8–1.2)
Prothrombin Time: 15.9 seconds — ABNORMAL HIGH (ref 11.4–15.2)

## 2019-06-03 LAB — GLUCOSE, CAPILLARY
Glucose-Capillary: 114 mg/dL — ABNORMAL HIGH (ref 70–99)
Glucose-Capillary: 176 mg/dL — ABNORMAL HIGH (ref 70–99)
Glucose-Capillary: 98 mg/dL (ref 70–99)
Glucose-Capillary: 166 mg/dL — ABNORMAL HIGH (ref 70–99)

## 2019-06-03 LAB — HEPARIN LEVEL (UNFRACTIONATED): Heparin Unfractionated: 0.72 IU/mL — ABNORMAL HIGH (ref 0.30–0.70)

## 2019-06-03 MED ORDER — ACETAMINOPHEN 500 MG PO TABS
1000.0000 mg | ORAL_TABLET | Freq: Four times a day (QID) | ORAL | Status: DC | PRN
  Administered 2019-06-04 – 2019-06-05 (×7): 1000 mg via ORAL
  Filled 2019-06-03 (×7): qty 2

## 2019-06-03 MED ORDER — WARFARIN SODIUM 6 MG PO TABS
6.0000 mg | ORAL_TABLET | Freq: Once | ORAL | Status: AC
  Administered 2019-06-03: 6 mg via ORAL
  Filled 2019-06-03: qty 1

## 2019-06-03 MED ORDER — BISACODYL 10 MG RE SUPP
10.0000 mg | Freq: Once | RECTAL | Status: AC
  Administered 2019-06-03: 10 mg via RECTAL
  Filled 2019-06-03: qty 1

## 2019-06-03 MED ORDER — HEPARIN (PORCINE) 25000 UT/250ML-% IV SOLN
1600.0000 [IU]/h | INTRAVENOUS | Status: DC
  Administered 2019-06-03 – 2019-06-04 (×3): 1600 [IU]/h via INTRAVENOUS
  Filled 2019-06-03 (×3): qty 250

## 2019-06-03 MED ORDER — HEPARIN (PORCINE) 25000 UT/250ML-% IV SOLN: 1650.0000 [IU]/h | INTRAVENOUS | Status: DC

## 2019-06-03 MED ORDER — ACETAMINOPHEN 500 MG PO TABS
500.0000 mg | ORAL_TABLET | Freq: Four times a day (QID) | ORAL | Status: DC
  Administered 2019-06-03 (×2): 500 mg via ORAL
  Filled 2019-06-03 (×2): qty 1

## 2019-06-03 NOTE — Progress Notes (Signed)
[place] for Heparin & Warfarin added 1/5 Indication: Bilateral DVT  Allergies  Allergen Reactions  . Sulfa Antibiotics Hives   Patient Measurements: Height: 6' (182.9 cm) Weight: 168 lb 15.7 oz (76.6 kg) IBW/kg (Calculated) : 77.6 Heparin Dosing Weight: TBW  Vital Signs: Temp: 97.9 F (36.6 C) (01/06 0322) BP: 165/88 (01/06 0322) Pulse Rate: 88 (01/06 0322)  Labs: Recent Labs    06/01/19 0358 06/02/19 0343 06/02/19 0803 06/02/19 1143 06/02/19 1146 06/03/19 0345  HGB 8.6* 8.6*  --   --   --  9.0*  HCT 27.3* 26.4*  --   --   --  28.7*  PLT 389 366  --   --   --  412*  LABPROT  --   --   --  14.0  --  15.9*  INR  --   --   --  1.1  --  1.3*  HEPARINUNFRC 0.58 0.55  --   --   --  0.72*  CREATININE 2.38*  --  0.80  --  2.33*  --     Estimated Creatinine Clearance: 32 mL/min (A) (by C-G formula based on SCr of 2.33 mg/dL (H)).   Assessment: 40 yoM with metastatic bladder cancer admitted with COVID gastroenteritis and AKI. D-dimer elevated and found to have bilateral DVT. Pharmacy requested to dose IV heparin infusion, begun 12/31  Baseline INR, aPTT: not done  Prior anticoagulation: enoxaparin 30 mg daily ppx; last dose 12/30 at 2216  Significant events: will not require percutaneous nephrostomy tubes now per urology  Today, 06/03/2019:  CBC: Hgb low but stable; Plt stable WNL, SCr improving slowly  Hep level 0.72, elevated this am, likely due to Warfarin  No bleeding or infusion issues per nursing  [facility] consulted for co-pays of Xarelto/Eliquis (Eliquis better choice with renal function) but patient cannot afford  INR 1.3, Warfarin started 1/5  Goal of Therapy: Heparin level 0.3-0.7 units/ml Monitor platelets by anticoagulation protocol: Yes  Plan:  Reduce Heparin further to 1600 units/hr, d/c 2p Hep level  Daily heparin level, CBC, daily PT/INR, s/s of bleed  Warfarin 6mg  today at 1800  Coumadin book ordered 1/5,  counseled by phone 1/5  Minda Ditto PharmD 06/03/2019, 7:26 AM

## 2019-06-03 NOTE — Progress Notes (Signed)
Physical Therapy Treatment Patient Details Name: Sergio Pennington MRN: [id number] DOB: Jul 23, 1950 Today's Date: 06/03/2019    History of Present Illness 69 y.o. male with medical history significant of bladder cancer status post urostomy, type 2 diabetes, hypertension, hyperlipidemia presenting to the ED for evaluation of generalized weakness.  Symptoms started 2 weeks ago.  He has had generalized weakness and fatigue.  Intermittent episodes of nonbloody emesis and nonbloody diarrhea.  He is having 3-5/10 intensity generalized abdominal pain and intermittent fevers at home.  He is coughing a little.  Denies shortness of breath or chest pain.  No additional history could be obtained from the patient as he started vomiting.    PT Comments    Pt continues to participate well.    Follow Up Recommendations  Home health PT     Equipment Recommendations  Rolling walker with 5" wheels    Recommendations for Other Services       Precautions / Restrictions Precautions Precautions: Fall Restrictions Weight Bearing Restrictions: No    Mobility  Bed Mobility Overal bed mobility: Modified Independent                Transfers Overall transfer level: Needs assistance Equipment used: Rolling walker (2 wheeled) Transfers: Sit to/from Stand Sit to Stand: Supervision         General transfer comment: for safesty. VCs hand placement  Ambulation/Gait Ambulation/Gait assistance: Min guard Gait Distance (Feet): 135 Feet Assistive device: Rolling walker (2 wheeled) Gait Pattern/deviations: Step-through pattern;Decreased stride length     General Gait Details: 10 laps around room.   Stairs             Wheelchair Mobility    Modified Rankin (Stroke Patients Only)       Balance Overall balance assessment: Needs assistance           Standing balance-Leahy Scale: Fair                              Cognition Arousal/Alertness:  Awake/alert Behavior During Therapy: WFL for tasks assessed/performed Overall Cognitive Status: Within Functional Limits for tasks assessed                                        Exercises General Exercises - Upper Extremity Shoulder Flexion: AROM;Both;10 reps;Standing Shoulder Horizontal ABduction: AROM;Both;10 reps;Standing Shoulder Horizontal ADduction: AROM;Both;10 reps;Standing General Exercises - Lower Extremity Hip ABduction/ADduction: AROM;Both;Standing;15 reps Toe Raises: AROM;Both;15 reps;Standing Heel Raises: AROM;Both;15 reps;Standing Mini-Sqauts: AROM;10 reps;Standing Other Exercises Other Exercises: Knee flexion/hamstring curls, standing, 10 reps, both. Incline push-ups (with hands on sink countertop) x 10 reps    General Comments        Pertinent Vitals/Pain Pain Assessment: Faces Faces Pain Scale: Hurts even more Pain Location: L side/pelvic area Pain Descriptors / Indicators: Sharp;Discomfort Pain Intervention(s): Monitored during session    Home Living                      Prior Function            PT Goals (current goals can now be found in the care plan section) Progress towards PT goals: Progressing toward goals    Frequency    Min 3X/week      PT Plan Current plan remains appropriate    Co-evaluation  AM-PAC PT "6 Clicks" Mobility   Outcome Measure  Help needed turning from your back to your side while in a flat bed without using bedrails?: None Help needed moving from lying on your back to sitting on the side of a flat bed without using bedrails?: None Help needed moving to and from a bed to a chair (including a wheelchair)?: A Little Help needed standing up from a chair using your arms (e.g., wheelchair or bedside chair)?: A Little Help needed to walk in hospital room?: A Little Help needed climbing 3-5 steps with a railing? : A Little 6 Click Score: 20    End of Session   Activity  Tolerance: Patient tolerated treatment well Patient left: in bed;with call bell/phone within reach;with bed alarm set   PT Visit Diagnosis: Muscle weakness (generalized) (M62.81);Difficulty in walking, not elsewhere classified (R26.2)     Time: 1916-6060 PT Time Calculation (min) (ACUTE ONLY): 30 min  Charges:  $Gait Training: 8-22 mins $Therapeutic Exercise: 8-22 mins              Doreatha Massed, PT Acute Rehabilitation

## 2019-06-03 NOTE — Progress Notes (Signed)
Pharmacy - IV heparin  Assessment:    Please see note from Graylin Shiver, PharmD earlier today for full details.  Briefly, 69 y.o. male on IV heparin for bilateral DVT   Most recent heparin level slightly supratherapeutic at 0.72 on 1700 units/hr; somewhat unexpected as patient had been very stable in middle of therapeutic range prior to this for nearly a week  Plan:   Decrease heparin slightly to 1650 units/hr  Recheck heparin level in 8 hrs  Reuel Boom, PharmD, BCPS [phone] 06/03/2019, 5:52 AM

## 2019-06-03 NOTE — Plan of Care (Signed)

## 2019-06-03 NOTE — Progress Notes (Signed)
Pt had large BM after Dulcolax and Miralax administered earlier in shift. SRP, RN

## 2019-06-03 NOTE — Progress Notes (Signed)
Pt gave consent to update spouse on his daily care. Called and updated spouse on plan of care and answered nursing questions. SRP, RN

## 2019-06-03 NOTE — Progress Notes (Signed)
PROGRESS NOTE    Sergio Pennington  PJK:[id number] DOB: 1951-01-12 DOA: 05/27/2019 PCP: Darreld Mclean, MD  Brief Narrative:  Per previous attending 69 y.o.malewith medical history significant ofbladder cancer status post urostomy, type 2 diabetes, hypertension, hyperlipidemia presenting to the ED for evaluation of generalized weakness.Symptoms started 2 weeks ago. He has had generalized weakness and fatigue. Intermittent episodes of nonbloody emesis and nonbloody diarrhea. He is having 3-5/10intensity generalized abdominal pain and intermittent fevers at home. He is coughing a little. Denies shortness of breath or chest pain. No additional history could be obtained from the patient as he started vomiting.  ED Course:Afebrile and hemodynamically stable. Oxygen saturation 97-100% on room air. SARS-CoV-2 rapid antigen test positive. WBC count 18.3. Lactic acid normal. Hemoglobin 8.9, ranging between 10.4-11 on recent labs. Sodium 133. BUN 67, creatinine 3.1. Creatinine ranging between 1.6-1.9 on recent labs. LFTs normal. UA with large amount of leukocytes, 21-50 RBCs, greater than 50 WBCs, and few bacteria. Urine culture pending. Blood culture x2 pending. C. difficile PCR and GI pathogen ordered ,Chest x-ray showing mild right basilar atelectasis and no focal consolidation. Patient received 1.5 L normal saline boluses. Patient is admitted for further management. Plan forDC home soon, urology plans for outpatient follow-up Starting warfarin 1/5 Suppository for constipation, some nausea.   Subjective:  Resting comfortably although he does still have some nausea. Still has constipation. Would like something to help move his bowels. Will try a suppository today and schedule his tylenol. Otherwise has started warfarin without any complications so far. Continue hep gtt.   Assessment & Plan:   Principal Problem:   COVID-19 virus infection Active Problems:  UTI (urinary tract infection)   Abdominal pain, vomiting, and diarrhea   AKI (acute kidney injury) ([place])   Anemia   Viral gastroenteritis suspected COVID-19 related/COVID-19 viral infection:CT abdomen showed mild to moderate hydronephrosis.  Work-up was negative for C. difficile, GI PCR panel negative.  X-ray on admission mild right basilar atelectasis otherwise no acute cardiopulmonary disease.  Respiratory status is stable, no tachypnea and not hypoxic.  Continue supportive care-antiemetics, clear liquid diet and advance as tolerated.  COVID-19 antigen positive: D-dimer greater than 20, ferritin 586, LDH 150, CRP 20.3.   Holding off any steroid remdesivir other treatment continue with supportive care.  Continue vitamin C and zinc, airborne precaution.  Monitor closely.  Proteus/Klebsiella urinary tract infection: Urostomy in place.  WBC overall still elevated.  Continue on ceftriaxone.  Ostomy consult for urostomy care - Continue augmentin  Leukocytosis : In the setting of UTI. WBC is downtrending slightly up this morning.  Monitor.  Patient is afebrile.  Blood culture is negative so far. - Continue augmentin  AKI/obstructive uropathy with bilateral hydronephrosis right more than left: Creatinine on admission 3.15 with a baseline 1.2-1.5 for the past year. Etiology likely secondary to volume loss with acute gastroenteritis/dehydration versus concern for urinary obstruction with fibrosis. Recent bladder resection with ileostomy by urology Dr. Tresa Moore August 2020Followed by urology.  Urostomy in place and draining well - Creatinine stable, hoping it will continue to down trend as time passes - Continue to monitor  Metabolic acidosis with bicarb 21 in the setting of CKD. Monitor  Acute bilateral lower extremity DVT: on right common femoral, right femoral, left common femoral, left femoral, left popliteal, left posterior tibial veins and left gastrocnemius vein. continue heparin drip.  transition to NOAC per oncology once off drip.  Continue to monitor patient closely and hopefully can transition once creatinine improves and  no need for percutaneous nephrostomy. -Warfarin w/ hep gtt bridge, dialy INR  Anemia surgical likely from chronic disease: Stable.  Monitor.  FOBT is negative.  Physical deconditioning/debility: Secondary to UTI acute viral illness.  Continue PT OT.  PT recommends home health on discharge.  Orders have been placed.  Bladder cancer hx Follows with medical oncology, Dr. Alen Blew. S/P cystectomy with ileal conduit diversion on 01/15/2019 by Dr. Tresa Moore with urology. CT abdomen/pelvis with possible progression of metastatic disease process. earlier MD discussed with Dr. Alen Blew; he will follow patient up closely following discharge for further recommendations  NIDDM A1c 6.4 continue sliding scale insulin and monitor  Mild hyponatremia from GI loss.  Resolved. Continue IV fluids.  Body mass index is 22.92 kg/m.    DVT prophylaxis:Heparin to warfarin Code Status: full code Family Communication: plan of care discussed with patient at bedside. Disposition Plan: Remains inpatient pending clinical improvement in renal function.  Home health once stable for discharge  Consultants:   Urology - Dr. Claudia Desanctis, Dr. Tresa Moore  Oncology - Dr. Alen Blew; discussed plan of care over telephone on 05/28/2019  Procedures: none Microbiology: Proteus and Klebsiella in urine culture  Antimicrobials: Augmentin  Objective: Vitals:   06/02/19 0428 06/02/19 1517 06/02/19 2029 06/03/19 0322  BP: (!) 159/76 (!) 166/75 (!) 166/78 (!) 165/88  Pulse: 80 88 91 88  Resp: 18 18 16 20   Temp: 98 F (36.7 C) 98.7 F (37.1 C) 99.3 F (37.4 C) 97.9 F (36.6 C)  TempSrc: Oral Oral    SpO2: 97% 97% 97% 98%  Weight:      Height:        Intake/Output Summary (Last 24 hours) at 06/03/2019 1020 Last data filed at 06/03/2019 0600 Gross per 24 hour  Intake 766.49 ml  Output 1926 ml   Net -1159.51 ml   Filed Weights   05/27/19 1846  Weight: 76.6 kg    Examination:  General exam: Appears calm and comfortable , exam otherwise unchanged Respiratory system: Clear to auscultation. Respiratory effort normal. Cardiovascular system: S1 & S2 heard, RRR.  Gastrointestinal system: Abdomen is nondistended, passes gas, soft and nontender. No organomegaly or masses felt. Normal bowel sounds heard but not robust. Nephrostomy tube clean and intact Central nervous system: Alert and oriented. No focal neurological deficits. Extremities: Symmetric 5 x 5 power. Skin: No rashes, lesions or ulcers Psychiatry: Judgement and insight appear normal. Mood & affect appropriate.     Data Reviewed: I have personally reviewed following labs and imaging studies  CBC: Recent Labs  Lab 05/28/19 0940 05/30/19 0307 05/31/19 0424 06/01/19 0358 06/02/19 0343 06/03/19 0345  WBC 17.3* 15.0* 16.7* 17.7* 18.3* 18.1*  NEUTROABS 14.1*  --   --   --   --   --   HGB 9.1* 8.3* 8.8* 8.6* 8.6* 9.0*  HCT 27.7* 25.8* 28.3* 27.3* 26.4* 28.7*  MCV 93.9 92.5 94.0 93.8 94.6 96.0  PLT 268 325 358 389 366 782*   Basic Metabolic Panel: Recent Labs  Lab 05/29/19 0248 05/30/19 0307 05/31/19 0424 06/01/19 0358 06/02/19 0803 06/02/19 1146  NA 136 133* 137 137 139 137  K 4.5 4.5 4.5 4.6 4.0 4.9  CL 104 103 109 110 108 106  CO2 20* 19* 19* 18* 24 21*  GLUCOSE 117* 139* 170* 134* 128* 137*  BUN 60* 53* 49* 45* 26* 46*  CREATININE 3.02* 2.60* 2.40* 2.38* 0.80 2.33*  CALCIUM 8.5* 8.1* 8.4* 8.6* 9.1 8.9  MG 2.0  --   --   --   --   --  GFR: Estimated Creatinine Clearance: 32 mL/min (A) (by C-G formula based on SCr of 2.33 mg/dL (H)). Liver Function Tests: Recent Labs  Lab 05/27/19 1923  AST 19  ALT 22  ALKPHOS 72  BILITOT 0.5  PROT 6.4*  ALBUMIN 2.8*   No results for input(s): LIPASE, AMYLASE in the last 168 hours. No results for input(s): AMMONIA in the last 168 hours. Coagulation  Profile: Recent Labs  Lab 06/02/19 1143 06/03/19 0345  INR 1.1 1.3*   Cardiac Enzymes: No results for input(s): CKTOTAL, CKMB, CKMBINDEX, TROPONINI in the last 168 hours. BNP (last 3 results) No results for input(s): PROBNP in the last 8760 hours. HbA1C: No results for input(s): HGBA1C in the last 72 hours. CBG: Recent Labs  Lab 06/02/19 0729 06/02/19 1135 06/02/19 1634 06/02/19 2025 06/03/19 0725  GLUCAP 130* 102* 112* 119* 114*   Lipid Profile: No results for input(s): CHOL, HDL, LDLCALC, TRIG, CHOLHDL, LDLDIRECT in the last 72 hours. Thyroid Function Tests: No results for input(s): TSH, T4TOTAL, FREET4, T3FREE, THYROIDAB in the last 72 hours. Anemia Panel: No results for input(s): VITAMINB12, FOLATE, FERRITIN, TIBC, IRON, RETICCTPCT in the last 72 hours. Sepsis Labs: Recent Labs  Lab 05/27/19 1920 05/27/19 1923  PROCALCITON 0.96  --   LATICACIDVEN  --  0.9    Recent Results (from the past 240 hour(s))  Blood Culture x 1     Status: None   Collection Time: 05/27/19  7:23 PM   Specimen: BLOOD RIGHT FOREARM  Result Value Ref Range Status   Specimen Description   Final    BLOOD RIGHT FOREARM BOTTLES DRAWN AEROBIC AND ANAEROBIC Blood Culture results may not be optimal due to an inadequate volume of blood received in culture bottles Performed at [facility], [place] [address], [place], [place] [address]    Special Requests   Final    NONE Performed at [facility], [place] [address] [place], [place] [address]    Culture   Final    NO GROWTH 5 DAYS Performed at [facility], [place] [address], [place], [place] [address]    Report Status 06/02/2019 FINAL  Final  Urine culture     Status: Abnormal   Collection Time: 05/27/19  7:23 PM   Specimen: Urine, Clean Catch  Result Value Ref Range Status   Specimen Description   Final    URINE, CLEAN CATCH Performed at [facility], [place] [address] [place],  [place] [address]    Special Requests   Final    NONE Performed at [facility], [place] [address] [place], [place] [address]    Culture (A)  Final    90,000 COLONIES/mL PROTEUS MIRABILIS >=100,000 COLONIES/mL KLEBSIELLA PNEUMONIAE    Report Status 05/31/2019 FINAL  Final   Organism ID, Bacteria PROTEUS MIRABILIS (A)  Final   Organism ID, Bacteria KLEBSIELLA PNEUMONIAE (A)  Final      Susceptibility   Klebsiella pneumoniae - MIC*    AMPICILLIN >=32 RESISTANT Resistant     CEFAZOLIN <=4 SENSITIVE Sensitive     CEFTRIAXONE <=0.25 SENSITIVE Sensitive     CIPROFLOXACIN <=0.25 SENSITIVE Sensitive     GENTAMICIN <=1 SENSITIVE Sensitive     IMIPENEM <=0.25 SENSITIVE Sensitive     NITROFURANTOIN 128 RESISTANT Resistant     TRIMETH/SULFA <=20 SENSITIVE Sensitive     AMPICILLIN/SULBACTAM 8 SENSITIVE Sensitive     PIP/TAZO <=4 SENSITIVE Sensitive     * >=100,000 COLONIES/mL KLEBSIELLA PNEUMONIAE  Proteus mirabilis - MIC*    AMPICILLIN <=2 SENSITIVE Sensitive     CEFAZOLIN <=4 SENSITIVE Sensitive     CEFTRIAXONE <=0.25 SENSITIVE Sensitive     CIPROFLOXACIN <=0.25 SENSITIVE Sensitive     GENTAMICIN <=1 SENSITIVE Sensitive     IMIPENEM 2 SENSITIVE Sensitive     NITROFURANTOIN 128 RESISTANT Resistant     TRIMETH/SULFA <=20 SENSITIVE Sensitive     AMPICILLIN/SULBACTAM <=2 SENSITIVE Sensitive     PIP/TAZO <=4 SENSITIVE Sensitive     * 90,000 COLONIES/mL PROTEUS MIRABILIS  GI pathogen panel by PCR, stool     Status: None   Collection Time: 05/27/19  9:32 PM   Specimen: Stool  Result Value Ref Range Status   Plesiomonas shigelloides NOT DETECTED NOT DETECTED Final   Yersinia enterocolitica NOT DETECTED NOT DETECTED Final   Vibrio NOT DETECTED NOT DETECTED Final   Enteropathogenic E coli NOT DETECTED NOT DETECTED Final   E coli (ETEC) LT/ST NOT DETECTED NOT DETECTED Final   E coli 4010 by PCR Not applicable NOT DETECTED Final   Cryptosporidium by PCR NOT DETECTED NOT DETECTED  Final   Entamoeba histolytica NOT DETECTED NOT DETECTED Final   Adenovirus F 40/41 NOT DETECTED NOT DETECTED Final   Norovirus GI/GII NOT DETECTED NOT DETECTED Final   Sapovirus NOT DETECTED NOT DETECTED Final    Comment: (NOTE) Performed At: [facility], [place], [place] [id number] Rush Farmer MD IH:[id number]    Vibrio cholerae NOT DETECTED NOT DETECTED Final   Campylobacter by PCR NOT DETECTED NOT DETECTED Final   Salmonella by PCR NOT DETECTED NOT DETECTED Final   E coli (STEC) NOT DETECTED NOT DETECTED Final   Enteroaggregative E coli NOT DETECTED NOT DETECTED Final   Shigella by PCR NOT DETECTED NOT DETECTED Final   Cyclospora cayetanensis NOT DETECTED NOT DETECTED Final   Astrovirus NOT DETECTED NOT DETECTED Final   G lamblia by PCR NOT DETECTED NOT DETECTED Final   Rotavirus A by PCR NOT DETECTED NOT DETECTED Final  C Difficile Quick Screen w PCR reflex     Status: None   Collection Time: 05/27/19  9:32 PM   Specimen: Stool  Result Value Ref Range Status   C Diff antigen NEGATIVE NEGATIVE Final   C Diff toxin NEGATIVE NEGATIVE Final   C Diff interpretation No C. difficile detected.  Final    Comment: Performed at [facility], [place] [address], [place], [place] [address]         Radiology Studies: No results found.      Scheduled Meds: . acetaminophen  500 mg Oral Q6H  . amoxicillin-clavulanate  1 tablet Oral Q12H  . vitamin C  500 mg Oral BID  . Chlorhexidine Gluconate Cloth  6 each Topical Daily  . cholecalciferol  2,000 Units Oral Daily  . insulin aspart  0-5 Units Subcutaneous QHS  . insulin aspart  0-9 Units Subcutaneous TID WC  . polyethylene glycol  17 g Oral Daily  . Warfarin - Pharmacist Dosing Inpatient   Does not apply q1800  . zinc sulfate  220 mg Oral Daily   Continuous Infusions: . heparin 1,600 Units/hr (06/03/19 0730)     LOS: 7 days    Time spent: 20 minutes  Arma Heading, MD [facility] Pager [phone]  If 7PM-7AM, please contact night-coverage [email or web address] Password [email or web address] 06/03/2019, 10:20 AM

## 2019-06-03 NOTE — TOC Progression Note (Signed)
Transition of Care [facility]) - Progression Note    Patient Details  Name: Sergio Pennington MRN: [id number] Date of Birth: July 12, 1950  [facility]) CM/SW Contact  Purcell Mouton, RN Phone Number: 06/03/2019, 2:30 PM  Clinical Narrative:    Spoke with pt concerning [facility] and he asked that I call his wife. Spoke with Sergio Pennington pt's wife who selected [facility]. Both are very pleasant and a pleasure to work with.         Expected Discharge Plan and Services                                                 Social Determinants of Health (SDOH) Interventions    Readmission Risk Interventions No flowsheet data found.

## 2019-06-04 LAB — BASIC METABOLIC PANEL
Anion gap: 10 (ref 5–15)
BUN: 48 mg/dL — ABNORMAL HIGH (ref 8–23)
CO2: 19 mmol/L — ABNORMAL LOW (ref 22–32)
Calcium: 9 mg/dL (ref 8.9–10.3)
Chloride: 105 mmol/L (ref 98–111)
Creatinine, Ser: 2.38 mg/dL — ABNORMAL HIGH (ref 0.61–1.24)
GFR calc Af Amer: 31 mL/min — ABNORMAL LOW (ref >60)
GFR calc non Af Amer: 27 mL/min — ABNORMAL LOW (ref >60)
Glucose, Bld: 160 mg/dL — ABNORMAL HIGH (ref 70–99)
Potassium: 5.1 mmol/L (ref 3.5–5.1)
Sodium: 134 mmol/L — ABNORMAL LOW (ref 135–145)

## 2019-06-04 LAB — PROTIME-INR
INR: 1.5 — ABNORMAL HIGH (ref 0.8–1.2)
Prothrombin Time: 17.6 seconds — ABNORMAL HIGH (ref 11.4–15.2)

## 2019-06-04 LAB — GLUCOSE, CAPILLARY
Glucose-Capillary: 143 mg/dL — ABNORMAL HIGH (ref 70–99)
Glucose-Capillary: 168 mg/dL — ABNORMAL HIGH (ref 70–99)
Glucose-Capillary: 135 mg/dL — ABNORMAL HIGH (ref 70–99)
Glucose-Capillary: 236 mg/dL — ABNORMAL HIGH (ref 70–99)

## 2019-06-04 LAB — CBC
HCT: 26.7 % — ABNORMAL LOW (ref 39.0–52.0)
Hemoglobin: 8.3 g/dL — ABNORMAL LOW (ref 13.0–17.0)
MCH: 29.4 pg (ref 26.0–34.0)
MCHC: 31.1 g/dL (ref 30.0–36.0)
MCV: 94.7 fL (ref 80.0–100.0)
Platelets: 403 10*3/uL — ABNORMAL HIGH (ref 150–400)
RBC: 2.82 MIL/uL — ABNORMAL LOW (ref 4.22–5.81)
RDW: 14.7 % (ref 11.5–15.5)
WBC: 16.8 10*3/uL — ABNORMAL HIGH (ref 4.0–10.5)
nRBC: 0.1 % (ref 0.0–0.2)

## 2019-06-04 LAB — HEPARIN LEVEL (UNFRACTIONATED): Heparin Unfractionated: 0.56 IU/mL (ref 0.30–0.70)

## 2019-06-04 MED ORDER — PATIROMER SORBITEX CALCIUM 8.4 G PO PACK
8.4000 g | PACK | Freq: Once | ORAL | Status: AC
  Administered 2019-06-04: 09:00:00 8.4 g via ORAL
  Filled 2019-06-04: qty 1

## 2019-06-04 MED ORDER — WARFARIN SODIUM 6 MG PO TABS
6.0000 mg | ORAL_TABLET | Freq: Once | ORAL | Status: AC
  Administered 2019-06-04: 17:00:00 6 mg via ORAL
  Filled 2019-06-04: qty 1

## 2019-06-04 NOTE — Plan of Care (Signed)

## 2019-06-04 NOTE — Progress Notes (Addendum)
Paged Sergio Pennington [phone].. plans to call wife later to follow-up concerning diet options for the patient. Updated Mrs. Whitehurst to expect a call later from dietician Janett Billow). SRP, RN

## 2019-06-04 NOTE — Progress Notes (Signed)
Care manager called and stated she spoke with spouse this am and she request for dietician contact her, will follow up and reach out to today's assigned dietician to call wife. SRP, RN

## 2019-06-04 NOTE — Progress Notes (Signed)
NUTRITION NOTE  Page received from RN this AM concerning patient being bridged to Coumadin/Warfarin and patient's wife having questions/concerns about PO intakes while patient on this medication. RN reported that wife stated that patient leans more toward a vegetarian diet and eats many produce items each day.   RN provided RD with wife's cell phone number. RD attempted to call this number and it immediately went to voicemail; voicemail message stated wife's name so did leave her a message. Encouraged her to communicate with MD here as well as PCP concerning a typical amount of leafy green and dark orange fruits and vegetables he consumes in a day and that labs will be monitored and medication can be adjusted by PCP, as needed. Informed her that there is no need for patient to decrease intakes of these foods.  Provided her with RD office number should she have further questions or concerns.    [name], MS, RD, LDN, [facility] Inpatient Clinical Dietitian Pager # [phone] After hours/weekend pager # [phone]

## 2019-06-04 NOTE — Progress Notes (Addendum)
Called spouse and updated on plan of care, answered questions pertaining to anticipated D/C per MD conversation and Pharmacy review (based on pt INR lab value and pt remain in stable condition). Wife appreciative of nursing follow-up/update. Pt present and at bedside eating breakfast during conversation. SRP, RN

## 2019-06-04 NOTE — Progress Notes (Signed)
PROGRESS NOTE    Sergio Pennington  ZOX:[id number] DOB: 29-Aug-1950 DOA: 05/27/2019 PCP: Darreld Mclean, MD  Brief Narrative:  Per previous attending 69 y.o.malewith medical history significant ofbladder cancer status post urostomy, type 2 diabetes, hypertension, hyperlipidemia presenting to the ED for evaluation of generalized weakness.Symptoms started 2 weeks ago. He has had generalized weakness and fatigue. Intermittent episodes of nonbloody emesis and nonbloody diarrhea. He is having 3-5/10intensity generalized abdominal pain and intermittent fevers at home. He is coughing a little. Denies shortness of breath or chest pain. No additional history could be obtained from the patient as he started vomiting.  ED Course:Afebrile and hemodynamically stable. Oxygen saturation 97-100% on room air. SARS-CoV-2 rapid antigen test positive. WBC count 18.3. Lactic acid normal. Hemoglobin 8.9, ranging between 10.4-11 on recent labs. Sodium 133. BUN 67, creatinine 3.1. Creatinine ranging between 1.6-1.9 on recent labs. LFTs normal. UA with large amount of leukocytes, 21-50 RBCs, greater than 50 WBCs, and few bacteria. Urine culture pending. Blood culture x2 pending. C. difficile PCR and GI pathogen ordered ,Chest x-ray showing mild right basilar atelectasis and no focal consolidation. Patient received 1.5 L normal saline boluses. Patient is admitted for further management. Plan forDC home soon, urology plans for outpatient follow-up Starting warfarin 1/5 Suppository for constipation, some nausea.  1/6 Noticed some trickle of blood in foley, resolved on its own. Creatinine not improved at all  Subjective:  Less nausea today. Had a BM yesterday. Is getting his tyleno lat appropriate dosage now. Resting comfortably. Had some blood in his urine but this has resolved on its own. Small trickle. Otherwise no issues today. Eating more. INR of 1.5, waiting for it to be 2.    Assessment & Plan:   Principal Problem:   COVID-19 virus infection Active Problems:   UTI (urinary tract infection)   Abdominal pain, vomiting, and diarrhea   AKI (acute kidney injury) ([place])   Anemia   Viral gastroenteritis suspected COVID-19 related/COVID-19 viral infection:CT abdomen showed mild to moderate hydronephrosis.  Work-up was negative for C. difficile, GI PCR panel negative.  X-ray on admission mild right basilar atelectasis otherwise no acute cardiopulmonary disease.  Respiratory status is stable, no tachypnea and not hypoxic.  Continue supportive care-antiemetics, Regular Diet  COVID-19 antigen positive: D-dimer greater than 20, ferritin 586, LDH 150, CRP 20.3.   Holding off any steroid remdesivir other treatment continue with supportive care.  Continue vitamin C and zinc, airborne precaution.  Monitor closely.  Proteus/Klebsiella urinary tract infection: Urostomy in place.  WBC overall still elevated.  Continue on ceftriaxone.  Ostomy consult for urostomy care - Continue augmentin, will finish 1/9  Leukocytosis : In the setting of UTI. WBC is downtrending slightly up this morning.  Monitor.  Patient is afebrile.  Blood culture is negative so far. - Continue augmentin  AKI/obstructive uropathy with bilateral hydronephrosis right more than left: Creatinine on admission 3.15 with a baseline 1.2-1.5 for the past year. Etiology likely secondary to volume loss with acute gastroenteritis/dehydration versus concern for urinary obstruction with fibrosis. Recent bladder resection with ileostomy by urology Dr. Tresa Moore August 2020Followed by urology.  Urostomy in place and draining well - Creatinine stable, hoping it will continue to down trend as time passes - Continue to monitor - Currently stable at 2.4 - One time dose of patiromer for K of 5.1 - Will likely need nephrology follow-up  Metabolic acidosis Stable. Monitor  Acute bilateral lower extremity DVT: on right Pennington  femoral, right femoral, left [place] femoral,  left femoral, left popliteal, left posterior tibial veins and left gastrocnemius vein. continue heparin drip. transition to NOAC per oncology once off drip.  Continue to monitor patient closely and hopefully can transition once creatinine improves and no need for percutaneous nephrostomy. -Warfarin w/ hep gtt bridge, dialy INR  Anemia surgical likely from chronic disease: Stable.  Monitor.  FOBT is negative.  Physical deconditioning/debility: Secondary to UTI acute viral illness.  Continue PT OT.  PT recommends home health on discharge.  Orders have been placed.  Bladder cancer hx Follows with medical oncology, Dr. Alen Blew. S/P cystectomy with ileal conduit diversion on 01/15/2019 by Dr. Tresa Moore with urology. CT abdomen/pelvis with possible progression of metastatic disease process. earlier MD discussed with Dr. Alen Blew; he will follow patient up closely following discharge for further recommendations  NIDDM A1c 6.4 continue sliding scale insulin and monitor  Mild hyponatremia from GI loss. Resolved. Continue IV fluids.  Body mass index is 22.92 kg/m.    DVT prophylaxis:Heparin to warfarin Code Status: full code Family Communication: plan of care discussed with patient at bedside. Disposition Plan: Remains inpatient pending clinical improvement in renal function.  Home health once stable for discharge  Consultants:   Urology - Dr. Claudia Desanctis, Dr. Tresa Moore  Oncology - Dr. Alen Blew; discussed plan of care over telephone on 05/28/2019  Procedures: none Microbiology: Proteus and Klebsiella in urine culture  Antimicrobials: Augmentin  Objective: Vitals:   06/03/19 0322 06/03/19 1218 06/03/19 2000 06/04/19 0547  BP: (!) 165/88 (!) 165/77 (!) 153/74 (!) 164/78  Pulse: 88 91 82 78  Resp: 20 20 20 20   Temp: 97.9 F (36.6 C) 98.1 F (36.7 C) 98.8 F (37.1 C) 98.4 F (36.9 C)  TempSrc:  Oral Oral   SpO2: 98% 100% 98% 97%  Weight:        Height:        Intake/Output Summary (Last 24 hours) at 06/04/2019 1053 Last data filed at 06/04/2019 0340 Gross per 24 hour  Intake 231.94 ml  Output 1550 ml  Net -1318.06 ml   Filed Weights   05/27/19 1846  Weight: 76.6 kg    Examination:  General exam: Appears calm and comfortable , exam otherwise unchanged since 1/6 Respiratory system: Clear to auscultation. Respiratory effort normal. Cardiovascular system: S1 & S2 heard, RRR.  Gastrointestinal system: Abdomen is nondistended, passes gas, soft and nontender. No organomegaly or masses felt. Normal bowel sounds heard but not robust. Nephrostomy tube clean and intact. Small trickle of blood in foley bag but now clear urine Central nervous system: Alert and oriented. No focal neurological deficits. Extremities: Symmetric 5 x 5 power. Skin: No rashes, lesions or ulcers Psychiatry: Judgement and insight appear normal. Mood & affect appropriate.     Data Reviewed: I have personally reviewed following labs and imaging studies  CBC: Recent Labs  Lab 05/31/19 0424 06/01/19 0358 06/02/19 0343 06/03/19 0345 06/04/19 0313  WBC 16.7* 17.7* 18.3* 18.1* 16.8*  HGB 8.8* 8.6* 8.6* 9.0* 8.3*  HCT 28.3* 27.3* 26.4* 28.7* 26.7*  MCV 94.0 93.8 94.6 96.0 94.7  PLT 358 389 366 412* 409*   Basic Metabolic Panel: Recent Labs  Lab 05/29/19 0248 05/31/19 0424 06/01/19 0358 06/02/19 0803 06/02/19 1146 06/04/19 0313  NA 136 137 137 139 137 134*  K 4.5 4.5 4.6 4.0 4.9 5.1  CL 104 109 110 108 106 105  CO2 20* 19* 18* 24 21* 19*  GLUCOSE 117* 170* 134* 128* 137* 160*  BUN 60* 49* 45* 26* 46* 48*  CREATININE 3.02* 2.40* 2.38* 0.80 2.33* 2.38*  CALCIUM 8.5* 8.4* 8.6* 9.1 8.9 9.0  MG 2.0  --   --   --   --   --    GFR: Estimated Creatinine Clearance: 31.3 mL/min (A) (by C-G formula based on SCr of 2.38 mg/dL (H)). Liver Function Tests: No results for input(s): AST, ALT, ALKPHOS, BILITOT, PROT, ALBUMIN in the last 168 hours. No  results for input(s): LIPASE, AMYLASE in the last 168 hours. No results for input(s): AMMONIA in the last 168 hours. Coagulation Profile: Recent Labs  Lab 06/02/19 1143 06/03/19 0345 06/04/19 0313  INR 1.1 1.3* 1.5*   Cardiac Enzymes: No results for input(s): CKTOTAL, CKMB, CKMBINDEX, TROPONINI in the last 168 hours. BNP (last 3 results) No results for input(s): PROBNP in the last 8760 hours. HbA1C: No results for input(s): HGBA1C in the last 72 hours. CBG: Recent Labs  Lab 06/03/19 0725 06/03/19 1215 06/03/19 1627 06/03/19 2222 06/04/19 0852  GLUCAP 114* 176* 98 166* 143*   Lipid Profile: No results for input(s): CHOL, HDL, LDLCALC, TRIG, CHOLHDL, LDLDIRECT in the last 72 hours. Thyroid Function Tests: No results for input(s): TSH, T4TOTAL, FREET4, T3FREE, THYROIDAB in the last 72 hours. Anemia Panel: No results for input(s): VITAMINB12, FOLATE, FERRITIN, TIBC, IRON, RETICCTPCT in the last 72 hours. Sepsis Labs: No results for input(s): PROCALCITON, LATICACIDVEN in the last 168 hours.  Recent Results (from the past 240 hour(s))  Blood Culture x 1     Status: None   Collection Time: 05/27/19  7:23 PM   Specimen: BLOOD RIGHT FOREARM  Result Value Ref Range Status   Specimen Description   Final    BLOOD RIGHT FOREARM BOTTLES DRAWN AEROBIC AND ANAEROBIC Blood Culture results may not be optimal due to an inadequate volume of blood received in culture bottles Performed at [facility], [place] [address] [place], [place] [address]    Special Requests   Final    NONE Performed at [facility], [place] [address] [place], [place] [address]    Culture   Final    NO GROWTH 5 DAYS Performed at [facility], [place] [address] [place], [place] [address]    Report Status 06/02/2019 FINAL  Final  Urine culture     Status: Abnormal   Collection Time: 05/27/19  7:23 PM   Specimen: Urine, Clean Catch  Result Value Ref Range Status   Specimen  Description   Final    URINE, CLEAN CATCH Performed at [facility], [place] [address], [place], [place] [address]    Special Requests   Final    NONE Performed at [facility], [place] [address] [place], [place] [address]    Culture (A)  Final    90,000 COLONIES/mL PROTEUS MIRABILIS >=100,000 COLONIES/mL KLEBSIELLA PNEUMONIAE    Report Status 05/31/2019 FINAL  Final   Organism ID, Bacteria PROTEUS MIRABILIS (A)  Final   Organism ID, Bacteria KLEBSIELLA PNEUMONIAE (A)  Final      Susceptibility   Klebsiella pneumoniae - MIC*    AMPICILLIN >=32 RESISTANT Resistant     CEFAZOLIN <=4 SENSITIVE Sensitive     CEFTRIAXONE <=0.25 SENSITIVE Sensitive     CIPROFLOXACIN <=0.25 SENSITIVE Sensitive     GENTAMICIN <=1 SENSITIVE Sensitive     IMIPENEM <=0.25 SENSITIVE Sensitive     NITROFURANTOIN 128 RESISTANT Resistant     TRIMETH/SULFA <=20 SENSITIVE Sensitive     AMPICILLIN/SULBACTAM 8 SENSITIVE Sensitive  PIP/TAZO <=4 SENSITIVE Sensitive     * >=100,000 COLONIES/mL KLEBSIELLA PNEUMONIAE   Proteus mirabilis - MIC*    AMPICILLIN <=2 SENSITIVE Sensitive     CEFAZOLIN <=4 SENSITIVE Sensitive     CEFTRIAXONE <=0.25 SENSITIVE Sensitive     CIPROFLOXACIN <=0.25 SENSITIVE Sensitive     GENTAMICIN <=1 SENSITIVE Sensitive     IMIPENEM 2 SENSITIVE Sensitive     NITROFURANTOIN 128 RESISTANT Resistant     TRIMETH/SULFA <=20 SENSITIVE Sensitive     AMPICILLIN/SULBACTAM <=2 SENSITIVE Sensitive     PIP/TAZO <=4 SENSITIVE Sensitive     * 90,000 COLONIES/mL PROTEUS MIRABILIS  GI pathogen panel by PCR, stool     Status: None   Collection Time: 05/27/19  9:32 PM   Specimen: Stool  Result Value Ref Range Status   Plesiomonas shigelloides NOT DETECTED NOT DETECTED Final   Yersinia enterocolitica NOT DETECTED NOT DETECTED Final   Vibrio NOT DETECTED NOT DETECTED Final   Enteropathogenic E coli NOT DETECTED NOT DETECTED Final   E coli (ETEC) LT/ST NOT DETECTED NOT  DETECTED Final   E coli 0973 by PCR Not applicable NOT DETECTED Final   Cryptosporidium by PCR NOT DETECTED NOT DETECTED Final   Entamoeba histolytica NOT DETECTED NOT DETECTED Final   Adenovirus F 40/41 NOT DETECTED NOT DETECTED Final   Norovirus GI/GII NOT DETECTED NOT DETECTED Final   Sapovirus NOT DETECTED NOT DETECTED Final    Comment: (NOTE) Performed At: [facility] [place], [place] [id number] Rush Farmer MD ST:[id number]    Vibrio cholerae NOT DETECTED NOT DETECTED Final   Campylobacter by PCR NOT DETECTED NOT DETECTED Final   Salmonella by PCR NOT DETECTED NOT DETECTED Final   E coli (STEC) NOT DETECTED NOT DETECTED Final   Enteroaggregative E coli NOT DETECTED NOT DETECTED Final   Shigella by PCR NOT DETECTED NOT DETECTED Final   Cyclospora cayetanensis NOT DETECTED NOT DETECTED Final   Astrovirus NOT DETECTED NOT DETECTED Final   G lamblia by PCR NOT DETECTED NOT DETECTED Final   Rotavirus A by PCR NOT DETECTED NOT DETECTED Final  C Difficile Quick Screen w PCR reflex     Status: None   Collection Time: 05/27/19  9:32 PM   Specimen: Stool  Result Value Ref Range Status   C Diff antigen NEGATIVE NEGATIVE Final   C Diff toxin NEGATIVE NEGATIVE Final   C Diff interpretation No C. difficile detected.  Final    Comment: Performed at [facility], [place] [address], [place], [place] [address]         Radiology Studies: No results found.      Scheduled Meds: . amoxicillin-clavulanate  1 tablet Oral Q12H  . vitamin C  500 mg Oral BID  . Chlorhexidine Gluconate Cloth  6 each Topical Daily  . cholecalciferol  2,000 Units Oral Daily  . insulin aspart  0-5 Units Subcutaneous QHS  . insulin aspart  0-9 Units Subcutaneous TID WC  . polyethylene glycol  17 g Oral Daily  . warfarin  6 mg Oral ONCE-1800  . Warfarin - Pharmacist Dosing Inpatient   Does not apply q1800  . zinc sulfate  220 mg Oral Daily   Continuous  Infusions: . heparin 1,600 Units/hr (06/04/19 0340)     LOS: 8 days    Time spent: 18 minutes  Arma Heading, MD [facility] Pager [phone]  If 7PM-7AM, please contact night-coverage [email or web address] Password [email or web address] 06/04/2019, 10:53 AM

## 2019-06-04 NOTE — Progress Notes (Signed)
[place] for Heparin, Warfarin (added 1/5) Indication: Bilateral DVT  Allergies  Allergen Reactions  . Sulfa Antibiotics Hives   Patient Measurements: Height: 6' (182.9 cm) Weight: 168 lb 15.7 oz (76.6 kg) IBW/kg (Calculated) : 77.6 Heparin Dosing Weight: total body weight   Vital Signs: Temp: 98.4 F (36.9 C) (01/07 0547) Temp Source: Oral (01/06 2000) BP: 164/78 (01/07 0547) Pulse Rate: 78 (01/07 0547)  Labs: Recent Labs    06/02/19 0343 06/02/19 0803 06/02/19 1143 06/02/19 1146 06/03/19 0345 06/04/19 0313  HGB 8.6*  --   --   --  9.0* 8.3*  HCT 26.4*  --   --   --  28.7* 26.7*  PLT 366  --   --   --  412* 403*  LABPROT  --   --  14.0  --  15.9* 17.6*  INR  --   --  1.1  --  1.3* 1.5*  HEPARINUNFRC 0.55  --   --   --  0.72* 0.56  CREATININE  --  0.80  --  2.33*  --  2.38*    Estimated Creatinine Clearance: 31.3 mL/min (A) (by C-G formula based on SCr of 2.38 mg/dL (H)).   Assessment: 51 yoM with metastatic bladder cancer admitted with COVID-19, gastroenteritis, and AKI. D-dimer elevated and found to have bilateral DVT. Pharmacy requested to dose IV heparin infusion beginning 12/31. Warfarin started on 1/5.   Significant events: will not require percutaneous nephrostomy tubes at this time per urology  Today, 06/04/2019:  CBC: Hgb 8.3, low but relatively stable; Pltc elevated. SCr slightly increased to 2.38.   Heparin level = 0.56, within therapeutic range  INR = 1.5, subtherapeutic but increasing  No frank bleeding or infusion issues per nursing. 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   TOC consulted for co-pays of Xarelto/Eliquis (Eliquis better choice with renal function) but patient cannot afford --> Warfarin initiated 1/5  Goal of Therapy: Heparin level 0.3-0.7 units/ml INR 2-3 Monitor platelets by anticoagulation protocol:  Yes  Plan:  Continue heparin infusion at 1600 units/hr  Warfarin 6mg  PO x 1 today   Daily heparin level, CBC, PT/INR  Monitor closely for s/sx of bleeding   Lindell Spar, PharmD, BCPS Clinical Pharmacist  06/04/2019, 7:28 AM

## 2019-06-05 ENCOUNTER — Encounter: Payer: Self-pay | Admitting: Family Medicine

## 2019-06-05 DIAGNOSIS — N183 Chronic kidney disease, stage 3 unspecified: Secondary | ICD-10-CM

## 2019-06-05 LAB — HEPARIN LEVEL (UNFRACTIONATED): Heparin Unfractionated: 0.61 IU/mL (ref 0.30–0.70)

## 2019-06-05 LAB — BASIC METABOLIC PANEL
Anion gap: 9 (ref 5–15)
BUN: 47 mg/dL — ABNORMAL HIGH (ref 8–23)
CO2: 19 mmol/L — ABNORMAL LOW (ref 22–32)
Calcium: 9.1 mg/dL (ref 8.9–10.3)
Chloride: 105 mmol/L (ref 98–111)
Creatinine, Ser: 2.38 mg/dL — ABNORMAL HIGH (ref 0.61–1.24)
GFR calc Af Amer: 31 mL/min — ABNORMAL LOW (ref >60)
GFR calc non Af Amer: 27 mL/min — ABNORMAL LOW (ref >60)
Glucose, Bld: 134 mg/dL — ABNORMAL HIGH (ref 70–99)
Potassium: 5.4 mmol/L — ABNORMAL HIGH (ref 3.5–5.1)
Sodium: 133 mmol/L — ABNORMAL LOW (ref 135–145)

## 2019-06-05 LAB — CBC
HCT: 24.9 % — ABNORMAL LOW (ref 39.0–52.0)
Hemoglobin: 7.9 g/dL — ABNORMAL LOW (ref 13.0–17.0)
MCH: 30.3 pg (ref 26.0–34.0)
MCHC: 31.7 g/dL (ref 30.0–36.0)
MCV: 95.4 fL (ref 80.0–100.0)
Platelets: 341 10*3/uL (ref 150–400)
RBC: 2.61 MIL/uL — ABNORMAL LOW (ref 4.22–5.81)
RDW: 15 % (ref 11.5–15.5)
WBC: 18.3 10*3/uL — ABNORMAL HIGH (ref 4.0–10.5)
nRBC: 0.2 % (ref 0.0–0.2)

## 2019-06-05 LAB — GLUCOSE, CAPILLARY
Glucose-Capillary: 140 mg/dL — ABNORMAL HIGH (ref 70–99)
Glucose-Capillary: 180 mg/dL — ABNORMAL HIGH (ref 70–99)

## 2019-06-05 LAB — PROTIME-INR
INR: 2.1 — ABNORMAL HIGH (ref 0.8–1.2)
Prothrombin Time: 23.8 seconds — ABNORMAL HIGH (ref 11.4–15.2)

## 2019-06-05 MED ORDER — WARFARIN SODIUM 2.5 MG PO TABS
2.5000 mg | ORAL_TABLET | Freq: Once | ORAL | Status: AC
  Administered 2019-06-05: 2.5 mg via ORAL
  Filled 2019-06-05: qty 1

## 2019-06-05 MED ORDER — WARFARIN SODIUM 5 MG PO TABS: 5.0000 mg | ORAL_TABLET | Freq: Every day | ORAL | 11 refills | Status: AC

## 2019-06-05 MED ORDER — PATIROMER SORBITEX CALCIUM 8.4 G PO PACK: 8.4000 g | PACK | Freq: Every day | ORAL | 0 refills | Status: DC

## 2019-06-05 MED ORDER — ASCORBIC ACID 500 MG PO TABS: 500.0000 mg | ORAL_TABLET | Freq: Two times a day (BID) | ORAL | 0 refills | Status: AC

## 2019-06-05 MED ORDER — PATIROMER SORBITEX CALCIUM 8.4 G PO PACK
8.4000 g | PACK | Freq: Every day | ORAL | Status: DC
  Administered 2019-06-05: 13:00:00 8.4 g via ORAL
  Filled 2019-06-05: qty 1

## 2019-06-05 NOTE — Progress Notes (Signed)
Heparin stopped per MD and pharmacy order.SRP, RN

## 2019-06-05 NOTE — Discharge Instructions (Signed)
 COVID-19 COVID-19 is a respiratory infection that is caused by a virus called severe acute respiratory syndrome coronavirus 2 (SARS-CoV-2). The disease is also known as coronavirus disease or novel coronavirus. In some people, the virus may not cause any symptoms. In others, it may cause a serious infection. The infection can get worse quickly and can lead to complications, such as:  Pneumonia, or infection of the lungs.  Acute respiratory distress syndrome or ARDS. This is a condition in which fluid build-up in the lungs prevents the lungs from filling with air and passing oxygen into the blood.  Acute respiratory failure. This is a condition in which there is not enough oxygen passing from the lungs to the body or when carbon dioxide is not passing from the lungs out of the body.  Sepsis or septic shock. This is a serious bodily reaction to an infection.  Blood clotting problems.  Secondary infections due to bacteria or fungus.  Organ failure. This is when your body's organs stop working. The virus that causes COVID-19 is contagious. This means that it can spread from person to person through droplets from coughs and sneezes (respiratory secretions). What are the causes? This illness is caused by a virus. You may catch the virus by:  Breathing in droplets from an infected person. Droplets can be spread by a person breathing, speaking, singing, coughing, or sneezing.  Touching something, like a table or a doorknob, that was exposed to the virus (contaminated) and then touching your mouth, nose, or eyes. What increases the risk? Risk for infection You are more likely to be infected with this virus if you:  Are within 6 feet (2 meters) of a person with COVID-19.  Provide care for or live with a person who is infected with COVID-19.  Spend time in crowded indoor spaces or live in shared housing. Risk for serious illness You are more likely to become seriously ill from the virus if  you:  Are 50 years of age or older. The higher your age, the more you are at risk for serious illness.  Live in a nursing home or long-term care facility.  Have cancer.  Have a long-term (chronic) disease such as: ? Chronic lung disease, including chronic obstructive pulmonary disease or asthma. ? A long-term disease that lowers your body's ability to fight infection (immunocompromised). ? Heart disease, including heart failure, a condition in which the arteries that lead to the heart become narrow or blocked (coronary artery disease), a disease which makes the heart muscle thick, weak, or stiff (cardiomyopathy). ? Diabetes. ? Chronic kidney disease. ? Sickle cell disease, a condition in which red blood cells have an abnormal "sickle" shape. ? Liver disease.  Are obese. What are the signs or symptoms? Symptoms of this condition can range from mild to severe. Symptoms may appear any time from 2 to 14 days after being exposed to the virus. They include:  A fever or chills.  A cough.  Difficulty breathing.  Headaches, body aches, or muscle aches.  Runny or stuffy (congested) nose.  A sore throat.  New loss of taste or smell. Some people may also have stomach problems, such as nausea, vomiting, or diarrhea. Other people may not have any symptoms of COVID-19. How is this diagnosed? This condition may be diagnosed based on:  Your signs and symptoms, especially if: ? You live in an area with a COVID-19 outbreak. ? You recently traveled to or from an area where the virus is common. ?   You provide care for or live with a person who was diagnosed with COVID-19. ? You were exposed to a person who was diagnosed with COVID-19.  A physical exam.  Lab tests, which may include: ? Taking a sample of fluid from the back of your nose and throat (nasopharyngeal fluid), your nose, or your throat using a swab. ? A sample of mucus from your lungs (sputum). ? Blood tests.  Imaging tests,  which may include, X-rays, CT scan, or ultrasound. How is this treated? At present, there is no medicine to treat COVID-19. Medicines that treat other diseases are being used on a trial basis to see if they are effective against COVID-19. Your health care provider will talk with you about ways to treat your symptoms. For most people, the infection is mild and can be managed at home with rest, fluids, and over-the-counter medicines. Treatment for a serious infection usually takes places in a hospital intensive care unit (ICU). It may include one or more of the following treatments. These treatments are given until your symptoms improve.  Receiving fluids and medicines through an IV.  Supplemental oxygen. Extra oxygen is given through a tube in the nose, a face mask, or a hood.  Positioning you to lie on your stomach (prone position). This makes it easier for oxygen to get into the lungs.  Continuous positive airway pressure (CPAP) or bi-level positive airway pressure (BPAP) machine. This treatment uses mild air pressure to keep the airways open. A tube that is connected to a motor delivers oxygen to the body.  Ventilator. This treatment moves air into and out of the lungs by using a tube that is placed in your windpipe.  Tracheostomy. This is a procedure to create a hole in the neck so that a breathing tube can be inserted.  Extracorporeal membrane oxygenation (ECMO). This procedure gives the lungs a chance to recover by taking over the functions of the heart and lungs. It supplies oxygen to the body and removes carbon dioxide. Follow these instructions at home: Lifestyle  If you are sick, stay home except to get medical care. Your health care provider will tell you how long to stay home. Call your health care provider before you go for medical care.  Rest at home as told by your health care provider.  Do not use any products that contain nicotine or tobacco, such as cigarettes,  e-cigarettes, and chewing tobacco. If you need help quitting, ask your health care provider.  Return to your normal activities as told by your health care provider. Ask your health care provider what activities are safe for you. General instructions  Take over-the-counter and prescription medicines only as told by your health care provider.  Drink enough fluid to keep your urine pale yellow.  Keep all follow-up visits as told by your health care provider. This is important. How is this prevented?  There is no vaccine to help prevent COVID-19 infection. However, there are steps you can take to protect yourself and others from this virus. To protect yourself:   Do not travel to areas where COVID-19 is a risk. The areas where COVID-19 is reported change often. To identify high-risk areas and travel restrictions, check the CDC travel website: wwwnc.cdc.gov/travel/notices  If you live in, or must travel to, an area where COVID-19 is a risk, take precautions to avoid infection. ? Stay away from people who are sick. ? Wash your hands often with soap and water for 20 seconds. If soap and   water are not available, use an alcohol-based hand sanitizer. ? Avoid touching your mouth, face, eyes, or nose. ? Avoid going out in public, follow guidance from your state and local health authorities. ? If you must go out in public, wear a cloth face covering or face mask. Make sure your mask covers your nose and mouth. ? Avoid crowded indoor spaces. Stay at least 6 feet (2 meters) away from others. ? Disinfect objects and surfaces that are frequently touched every day. This may include:  Counters and tables.  Doorknobs and light switches.  Sinks and faucets.  Electronics, such as phones, remote controls, keyboards, computers, and tablets. To protect others: If you have symptoms of COVID-19, take steps to prevent the virus from spreading to others.  If you think you have a COVID-19 infection, contact  your health care provider right away. Tell your health care team that you think you may have a COVID-19 infection.  Stay home. Leave your house only to seek medical care. Do not use public transport.  Do not travel while you are sick.  Wash your hands often with soap and water for 20 seconds. If soap and water are not available, use alcohol-based hand sanitizer.  Stay away from other members of your household. Let healthy household members care for children and pets, if possible. If you have to care for children or pets, wash your hands often and wear a mask. If possible, stay in your own room, separate from others. Use a different bathroom.  Make sure that all people in your household wash their hands well and often.  Cough or sneeze into a tissue or your sleeve or elbow. Do not cough or sneeze into your hand or into the air.  Wear a cloth face covering or face mask. Make sure your mask covers your nose and mouth. Where to find more information  Centers for Disease Control and Prevention: www.cdc.gov/coronavirus/2019-ncov/index.html  World Health Organization: www.who.int/health-topics/coronavirus Contact a health care provider if:  You live in or have traveled to an area where COVID-19 is a risk and you have symptoms of the infection.  You have had contact with someone who has COVID-19 and you have symptoms of the infection. Get help right away if:  You have trouble breathing.  You have pain or pressure in your chest.  You have confusion.  You have bluish lips and fingernails.  You have difficulty waking from sleep.  You have symptoms that get worse. These symptoms may represent a serious problem that is an emergency. Do not wait to see if the symptoms will go away. Get medical help right away. Call your local emergency services (911 in the [place] Do not drive yourself to the hospital. Let the emergency medical personnel know if you think you have  COVID-19. Summary  COVID-19 is a respiratory infection that is caused by a virus. It is also known as coronavirus disease or novel coronavirus. It can cause serious infections, such as pneumonia, acute respiratory distress syndrome, acute respiratory failure, or sepsis.  The virus that causes COVID-19 is contagious. This means that it can spread from person to person through droplets from breathing, speaking, singing, coughing, or sneezing.  You are more likely to develop a serious illness if you are 50 years of age or older, have a weak immune system, live in a nursing home, or have chronic disease.  There is no medicine to treat COVID-19. Your health care provider will talk with you about ways to treat your   Take steps to protect yourself and others from infection. Wash your hands often and disinfect objects and surfaces that are frequently touched every day. Stay away from people who are sick and wear a mask if you are sick. This information is not intended to replace advice given to you by your health care provider. Make sure you discuss any questions you have with your health care provider. Document Revised: 03/13/2019 Document Reviewed: 06/19/2018 Elsevier Patient Education  2020 [employer]   COVID-19 Frequently Asked Questions COVID-19 (coronavirus disease) is an infection that is caused by a large family of viruses. Some viruses cause illness in people and others cause illness in animals like camels, cats, and bats. In some cases, the viruses that cause illness in animals can spread to humans. Where did the coronavirus come from? In December 2019, [place] told the World Health Organization (WHO) of several cases of lung disease (human respiratory illness). These cases were linked to an open seafood and livestock market in the city of [place]. The link to the seafood and livestock market suggests that the virus may have spread from animals to humans. However, since that first  outbreak in December, the virus has also been shown to spread from person to person. What is the name of the disease and the virus? Disease name Early on, this disease was called novel coronavirus. This is because scientists determined that the disease was caused by a new (novel) respiratory virus. The World Health Organization (WHO) has now named the disease COVID-19, or coronavirus disease. Virus name The virus that causes the disease is called severe acute respiratory syndrome coronavirus 2 (SARS-CoV-2). More information on disease and virus naming World Health Organization (WHO): www.who.int/emergencies/diseases/novel-coronavirus-2019/technical-guidance/naming-the-coronavirus-disease-(covid-2019)-and-the-virus-that-causes-it Who is at risk for complications from coronavirus disease? Some people may be at higher risk for complications from coronavirus disease. This includes older adults and people who have chronic diseases, such as heart disease, diabetes, and lung disease. If you are at higher risk for complications, take these extra precautions:  Stay home as much as possible.  Avoid social gatherings and travel.  Avoid close contact with others. Stay at least 6 ft (2 m) away from others, if possible.  Wash your hands often with soap and water for at least 20 seconds.  Avoid touching your face, mouth, nose, or eyes.  Keep supplies on hand at home, such as food, medicine, and cleaning supplies.  If you must go out in public, wear a cloth face covering or face mask. Make sure your mask covers your nose and mouth. How does coronavirus disease spread? The virus that causes coronavirus disease spreads easily from person to person (is contagious). You may catch the virus by:  Breathing in droplets from an infected person. Droplets can be spread by a person breathing, speaking, singing, coughing, or sneezing.  Touching something, like a table or a doorknob, that was exposed to the virus  (contaminated) and then touching your mouth, nose, or eyes. Can I get the virus from touching surfaces or objects? There is still a lot that we do not know about the virus that causes coronavirus disease. Scientists are basing a lot of information on what they know about similar viruses, such as:  Viruses cannot generally survive on surfaces for long. They need a human body (host) to survive.  It is more likely that the virus is spread by close contact with people who are sick (direct contact), such as through: ? Shaking hands or hugging. ? Breathing in respiratory droplets   that travel through the air. Droplets can be spread by a person breathing, speaking, singing, coughing, or sneezing.  It is less likely that the virus is spread when a person touches a surface or object that has the virus on it (indirect contact). The virus may be able to enter the body if the person touches a surface or object and then touches his or her face, eyes, nose, or mouth. Can a person spread the virus without having symptoms of the disease? It may be possible for the virus to spread before a person has symptoms of the disease, but this is most likely not the main way the virus is spreading. It is more likely for the virus to spread by being in close contact with people who are sick and breathing in the respiratory droplets spread by a person breathing, speaking, singing, coughing, or sneezing. What are the symptoms of coronavirus disease? Symptoms vary from person to person and can range from mild to severe. Symptoms may include:  Fever or chills.  Cough.  Difficulty breathing or feeling short of breath.  Headaches, body aches, or muscle aches.  Runny or stuffy (congested) nose.  Sore throat.  New loss of taste or smell.  Nausea, vomiting, or diarrhea. These symptoms can appear anywhere from 2 to 14 days after you have been exposed to the virus. Some people may not have any symptoms. If you develop  symptoms, call your health care provider. People with severe symptoms may need hospital care. Should I be tested for this virus? Your health care provider will decide whether to test you based on your symptoms, history of exposure, and your risk factors. How does a health care provider test for this virus? Health care providers will collect samples to send for testing. Samples may include:  Taking a swab of fluid from the back of your nose and throat, your nose, or your throat.  Taking fluid from the lungs by having you cough up mucus (sputum) into a sterile cup.  Taking a blood sample. Is there a treatment or vaccine for this virus? Currently, there is no vaccine to prevent coronavirus disease. Also, there are no medicines like antibiotics or antivirals to treat the virus. A person who becomes sick is given supportive care, which means rest and fluids. A person may also relieve his or her symptoms by using over-the-counter medicines that treat sneezing, coughing, and runny nose. These are the same medicines that a person takes for the common cold. If you develop symptoms, call your health care provider. People with severe symptoms may need hospital care. What can I do to protect myself and my family from this virus?     You can protect yourself and your family by taking the same actions that you would take to prevent the spread of other viruses. Take the following actions:  Wash your hands often with soap and water for at least 20 seconds. If soap and water are not available, use alcohol-based hand sanitizer.  Avoid touching your face, mouth, nose, or eyes.  Cough or sneeze into a tissue, sleeve, or elbow. Do not cough or sneeze into your hand or the air. ? If you cough or sneeze into a tissue, throw it away immediately and wash your hands.  Disinfect objects and surfaces that you frequently touch every day.  Stay away from people who are sick.  Avoid going out in public, follow  guidance from your state and local health authorities.  Avoid crowded indoor spaces.   Stay at least 6 ft (2 m) away from others.  If you must go out in public, wear a cloth face covering or face mask. Make sure your mask covers your nose and mouth.  Stay home if you are sick, except to get medical care. Call your health care provider before you get medical care. Your health care provider will tell you how long to stay home.  Make sure your vaccines are up to date. Ask your health care provider what vaccines you need. What should I do if I need to travel? Follow travel recommendations from your local health authority, the CDC, and WHO. Travel information and advice  Centers for Disease Control and Prevention (CDC): www.cdc.gov/coronavirus/2019-ncov/travelers/index.html  World Health Organization (WHO): www.who.int/emergencies/diseases/novel-coronavirus-2019/travel-advice Know the risks and take action to protect your health  You are at higher risk of getting coronavirus disease if you are traveling to areas with an outbreak or if you are exposed to travelers from areas with an outbreak.  Wash your hands often and practice good hygiene to lower the risk of catching or spreading the virus. What should I do if I am sick? General instructions to stop the spread of infection  Wash your hands often with soap and water for at least 20 seconds. If soap and water are not available, use alcohol-based hand sanitizer.  Cough or sneeze into a tissue, sleeve, or elbow. Do not cough or sneeze into your hand or the air.  If you cough or sneeze into a tissue, throw it away immediately and wash your hands.  Stay home unless you must get medical care. Call your health care provider or local health authority before you get medical care.  Avoid public areas. Do not take public transportation, if possible.  If you can, wear a mask if you must go out of the house or if you are in close contact with someone  who is not sick. Make sure your mask covers your nose and mouth. Keep your home clean  Disinfect objects and surfaces that are frequently touched every day. This may include: ? Counters and tables. ? Doorknobs and light switches. ? Sinks and faucets. ? Electronics such as phones, remote controls, keyboards, computers, and tablets.  Wash dishes in hot, soapy water or use a dishwasher. Air-dry your dishes.  Wash laundry in hot water. Prevent infecting other household members  Let healthy household members care for children and pets, if possible. If you have to care for children or pets, wash your hands often and wear a mask.  Sleep in a different bedroom or bed, if possible.  Do not share personal items, such as razors, toothbrushes, deodorant, combs, brushes, towels, and washcloths. Where to find more information Centers for Disease Control and Prevention (CDC)  Information and news updates: www.cdc.gov/coronavirus/2019-ncov World Health Organization (WHO)  Information and news updates: www.who.int/emergencies/diseases/novel-coronavirus-2019  Coronavirus health topic: www.who.int/health-topics/coronavirus  Questions and answers on COVID-19: www.who.int/news-room/q-a-detail/q-a-coronaviruses  Global tracker: who.sprinklr.com American Academy of Pediatrics (AAP)  Information for families: www.healthychildren.org/English/health-issues/conditions/chest-lungs/Pages/2019-Novel-Coronavirus.aspx The coronavirus situation is changing rapidly. Check your local health authority website or the CDC and WHO websites for updates and news. When should I contact a health care provider?  Contact your health care provider if you have symptoms of an infection, such as fever or cough, and you: ? Have been near anyone who is known to have coronavirus disease. ? Have come into contact with a person who is suspected to have coronavirus disease. ? Have traveled to an area where there is   there is an outbreak of  COVID-19. When should I get emergency medical care?  Get help right away by calling your local emergency services (911 in the [place] if you have: ? Trouble breathing. ? Pain or pressure in your chest. ? Confusion. ? Blue-tinged lips and fingernails. ? Difficulty waking from sleep. ? Symptoms that get worse. Let the emergency medical personnel know if you think you have coronavirus disease. Summary  A new respiratory virus is spreading from person to person and causing COVID-19 (coronavirus disease).  The virus that causes COVID-19 appears to spread easily. It spreads from one person to another through droplets from breathing, speaking, singing, coughing, or sneezing.  Older adults and those with chronic diseases are at higher risk of disease. If you are at higher risk for complications, take extra precautions.  There is currently no vaccine to prevent coronavirus disease. There are no medicines, such as antibiotics or antivirals, to treat the virus.  You can protect yourself and your family by washing your hands often, avoiding touching your face, and covering your coughs and sneezes. This information is not intended to replace advice given to you by your health care provider. Make sure you discuss any questions you have with your health care provider. Document Revised: 03/13/2019 Document Reviewed: 09/09/2018 Elsevier Patient Education  [place] Can Do to Manage Your COVID-19 Symptoms at Home If you have possible or confirmed COVID-19: 1. Stay home from work and school. And stay away from other public places. If you must go out, avoid using any kind of public transportation, ridesharing, or taxis. 2. Monitor your symptoms carefully. If your symptoms get worse, call your healthcare provider immediately. 3. Get rest and stay hydrated. 4. If you have a medical appointment, call the healthcare provider ahead of time and tell them that you have or may have  COVID-19. 5. For medical emergencies, call 911 and notify the dispatch personnel that you have or may have COVID-19. 6. Cover your cough and sneezes with a tissue or use the inside of your elbow. 7. Wash your hands often with soap and water for at least 20 seconds or clean your hands with an alcohol-based hand sanitizer that contains at least 60% alcohol. 8. As much as possible, stay in a specific room and away from other people in your home. Also, you should use a separate bathroom, if available. If you need to be around other people in or outside of the home, wear a mask. 9. Avoid sharing personal items with other people in your household, like dishes, towels, and bedding. 10. Clean all surfaces that are touched often, like counters, tabletops, and doorknobs. Use household cleaning sprays or wipes according to the label instructions. [email or web address] 11/26/2018 This information is not intended to replace advice given to you by your health care provider. Make sure you discuss any questions you have with your health care provider. Document Revised: 04/30/2019 Document Reviewed: 04/30/2019 Elsevier Patient Education  [place] on my medicine - Coumadin   (Warfarin)  This medication education was reviewed with me or my healthcare representative as part of my discharge preparation.  The pharmacist that spoke with me during my hospital stay was:  Minda Ditto, [facility]  Why was Coumadin prescribed for you? Coumadin was prescribed for you because you have a blood clot or a medical condition that can cause an increased risk of forming blood clots. Blood clots can cause serious health problems by blocking the  flow of blood to the heart, lung, or brain. Coumadin can prevent harmful blood clots from forming. As a reminder your indication for Coumadin is:   Select from menu  What test will check on my response to Coumadin? While on Coumadin (warfarin) you will need to have an INR  test regularly to ensure that your dose is keeping you in the desired range. The INR (international normalized ratio) number is calculated from the result of the laboratory test called prothrombin time (PT).  If an INR APPOINTMENT HAS NOT ALREADY BEEN MADE FOR YOU please schedule an appointment to have this lab work done by your health care provider within 7 days. Your INR goal is usually a number between:  2 to 3 or your provider may give you a more narrow range like 2-2.5.  Ask your health care provider during an office visit what your goal INR is.  What  do you need to  know  About  COUMADIN? Take Coumadin (warfarin) exactly as prescribed by your healthcare provider about the same time each day.  DO NOT stop taking without talking to the doctor who prescribed the medication.  Stopping without other blood clot prevention medication to take the place of Coumadin may increase your risk of developing a new clot or stroke.  Get refills before you run out.  What do you do if you miss a dose? If you miss a dose, take it as soon as you remember on the same day then continue your regularly scheduled regimen the next day.  Do not take two doses of Coumadin at the same time.  Important Safety Information A possible side effect of Coumadin (Warfarin) is an increased risk of bleeding. You should call your healthcare provider right away if you experience any of the following: ? Bleeding from an injury or your nose that does not stop. ? Unusual colored urine (red or dark brown) or unusual colored stools (red or black). ? Unusual bruising for unknown reasons. ? A serious fall or if you hit your head (even if there is no bleeding).  Some foods or medicines interact with Coumadin (warfarin) and might alter your response to warfarin. To help avoid this: ? Eat a balanced diet, maintaining a consistent amount of Vitamin K. ? Notify your provider about major diet changes you plan to make. ? Avoid alcohol or limit  your intake to 1 drink for women and 2 drinks for men per day. (1 drink is 5 oz. wine, 12 oz. beer, or 1.5 oz. liquor.)  Make sure that ANY health care provider who prescribes medication for you knows that you are taking Coumadin (warfarin).  Also make sure the healthcare provider who is monitoring your Coumadin knows when you have started a new medication including herbals and non-prescription products.  Coumadin (Warfarin)  Major Drug Interactions  Increased Warfarin Effect Decreased Warfarin Effect  Alcohol (large quantities) Antibiotics (esp. Septra/Bactrim, Flagyl, Cipro) Amiodarone (Cordarone) Aspirin (ASA) Cimetidine (Tagamet) Megestrol (Megace) NSAIDs (ibuprofen, naproxen, etc.) Piroxicam (Feldene) Propafenone (Rythmol SR) Propranolol (Inderal) Isoniazid (INH) Posaconazole (Noxafil) Barbiturates (Phenobarbital) Carbamazepine (Tegretol) Chlordiazepoxide (Librium) Cholestyramine (Questran) Griseofulvin Oral Contraceptives Rifampin Sucralfate (Carafate) Vitamin K   Coumadin (Warfarin) Major Herbal Interactions  Increased Warfarin Effect Decreased Warfarin Effect  Garlic Ginseng Ginkgo biloba Coenzyme Q10 Green tea St. John's wort    Coumadin (Warfarin) FOOD Interactions  Eat a consistent number of servings per week of foods HIGH in Vitamin K (1 serving =  cup)  Collards (cooked, or boiled &  drained) Kale (cooked, or boiled & drained) Mustard greens (cooked, or boiled & drained) Parsley *serving size only =  cup Spinach (cooked, or boiled & drained) Swiss chard (cooked, or boiled & drained) Turnip greens (cooked, or boiled & drained)  Eat a consistent number of servings per week of foods MEDIUM-HIGH in Vitamin K (1 serving = 1 cup)  Asparagus (cooked, or boiled & drained) Broccoli (cooked, boiled & drained, or raw & chopped) Brussel sprouts (cooked, or boiled & drained) *serving size only =  cup Lettuce, raw (green leaf, endive, romaine) Spinach,  raw Turnip greens, raw & chopped   These websites have more information on Coumadin (warfarin):  FailFactory.se; VeganReport.com.au;

## 2019-06-05 NOTE — Progress Notes (Signed)
[facility] for Heparin, Warfarin (added 1/5) Indication: Bilateral DVT  Allergies  Allergen Reactions  . Sulfa Antibiotics Hives   Patient Measurements: Height: 6' (182.9 cm) Weight: 168 lb 15.7 oz (76.6 kg) IBW/kg (Calculated) : 77.6 Heparin Dosing Weight: total body weight   Vital Signs: Temp: 99 F (37.2 C) (01/08 0626) BP: 160/76 (01/08 0626) Pulse Rate: 85 (01/08 0626)  Labs: Recent Labs    06/02/19 1143 06/02/19 1146 06/03/19 0345 06/04/19 0313 06/05/19 0319  HGB   < >  --  9.0* 8.3* 7.9*  HCT  --   --  28.7* 26.7* 24.9*  PLT  --   --  412* 403* 341  LABPROT  --   --  15.9* 17.6* 23.8*  INR  --   --  1.3* 1.5* 2.1*  HEPARINUNFRC  --   --  0.72* 0.56 0.61  CREATININE  --  2.33*  --  2.38* 2.38*   < > = values in this interval not displayed.    Estimated Creatinine Clearance: 31.3 mL/min (A) (by C-G formula based on SCr of 2.38 mg/dL (H)).   Assessment: 89 yoM with metastatic bladder cancer admitted with COVID-19, gastroenteritis, and AKI. D-dimer elevated and found to have bilateral DVT. Pharmacy requested to dose IV heparin infusion beginning 12/31. Warfarin started on 1/5.   Significant events: will not require percutaneous nephrostomy tubes at this time per urology  Today, 06/05/2019:  CBC: Hgb 7.9, low but relatively stable; Pltc now wnl. SCr continues 2.38.   Heparin level = 0.61, within therapeutic range  INR = 2.1,  subtherapeutic but increasing  No frank bleeding or infusion issues per nursing. 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   TOC consulted for co-pays of Xarelto/Eliquis (Eliquis better choice with renal function) but patient cannot afford --> Warfarin initiated 1/5  Goal of Therapy: Heparin level 0.3-0.7 units/ml INR 2-3 Monitor platelets by anticoagulation protocol: Yes  Plan:  Continue heparin infusion at  1600 units/hr - MD aware of therapeutic INR  Possible d/c today  Warfarin 2.5mg  PO x 1 today   Daily heparin level, CBC, PT/INR  Monitor closely for s/sx of bleeding   Minda Ditto PharmD 06/05/2019, 8:10 AM

## 2019-06-05 NOTE — Progress Notes (Signed)
Called pt spouse to update her on the planned discharge for today to allow her time to prepare personal needs. Pt stable and resting. Coumadin administered for today. Will call wife again to go over/review discharge teaching and paperwork once complete. SRP, RN

## 2019-06-05 NOTE — Care Management Important Message (Signed)
Important Message  Patient Details IM Letter given to Gabriel Earing RN Case Manager to present to the Patient Name: Sergio Pennington MRN: [id number] Date of Birth: 04/12/1951   Medicare Important Message Given:  Yes     Kerin Salen 06/05/2019, 11:58 AM

## 2019-06-05 NOTE — Progress Notes (Signed)
Pt d/c to home with spouse, instruction reviewed over the phone with wife, including Coumadin and Covid teaching, appointment review and prescriptions pickup. Acknowwledged understanding. SRP, RN

## 2019-06-05 NOTE — Discharge Summary (Signed)
Physician Discharge Summary  Sergio Pennington RRN:[id number] DOB: 09-25-1950 DOA: 05/27/2019  PCP: Darreld Mclean, MD  Admit date: 05/27/2019 Discharge date: 06/05/2019  Admitted From: Home Disposition: Home  Recommendations for Outpatient Follow-up:  1. Follow up with PCP next Thursday 2. Continue warfarin, adjust dosage based on INR, check INR 3. Please obtain BMP/CBC in one week 4. Continue SPS or veltassa as needed, check potassium, address constipation 5. If kidney function not improved, likely needs referral to nephrology 6. Needs to follow with urology for nephrostomy tubes 7. Ensure resolution of covid, never had any symptoms 8. Had DVT, likely trigger by covid, plan for 3 month treatment and reasses need for longterm [facility]  Home Health: Yes Equipment/Devices: Foley   Discharge Condition: Stable CODE STATUS: Full  Diet recommendation:Regular as tolerated as patient does not eat otherwise  Brief/Interim Summary: Per previous attending68 y.o.malewith medical history significant ofbladder cancer status post urostomy, type 2 diabetes, hypertension, hyperlipidemia presenting to the ED for evaluation of generalized weakness.Symptoms started 2 weeks ago. He has had generalized weakness and fatigue. Intermittent episodes of nonbloody emesis and nonbloody diarrhea. He is having 3-5/10intensity generalized abdominal pain and intermittent fevers at home. He is coughing a little. Denies shortness of breath or chest pain. No additional history could be obtained from the patient as he started vomiting.  ED Course:Afebrile and hemodynamically stable. Oxygen saturation 97-100% on room air. SARS-CoV-2 rapid antigen test positive. WBC count 18.3. Lactic acid normal. Hemoglobin 8.9, ranging between 10.4-11 on recent labs. Sodium 133. BUN 67, creatinine 3.1. Creatinine ranging between 1.6-1.9 on recent labs. LFTs normal. UA with large amount of leukocytes, 21-50  RBCs, greater than 50 WBCs, and few bacteria. Urine culture pending. Blood culture x2 pending. C. difficile PCR and GI pathogenordered ,Chest x-ray showing mild right basilar atelectasis and no focal consolidation. Patient received 1.5 L normal saline boluses.  1/5 Starting warfarin. Suppository for constipation, some nausea.  1/6 Noticed some trickle of blood in foley, resolved on its own. Creatinine not improved at all 17/ Creatinine stable, warfarin ongoing, no signs of bleeding 1/8: INR therapeutic, stop heparin, discharge w/ follow-up  Discharge Diagnoses:  Principal Problem:   COVID-19 virus infection Active Problems:   Anemia   CKD (chronic kidney disease), stage III   Discharge Instructions  Discharge Instructions    Diet - low sodium heart healthy   Complete by: As directed    Discharge instructions   Complete by: As directed    1. Continue to take medications as prescribed 2. Look out for signs of bleeding or dark tarry stools, please contact PCP right away if this occurs 3. Please follow-up with Urology   Increase activity slowly   Complete by: As directed    MyChart COVID-19 home monitoring program   Complete by: Jun 05, 2019    Is the patient willing to use the [facility] for home monitoring?: Yes   Temperature monitoring   Complete by: Jun 05, 2019    After how many days would you like to receive a notification of this patient's flowsheet entries?: 1     Allergies as of 06/05/2019      Reactions   Sulfa Antibiotics Hives      Medication List    STOP taking these medications   cephALEXin 500 MG capsule Commonly known as: KEFLEX   ibuprofen 200 MG tablet Commonly known as: ADVIL   LORazepam 0.5 MG tablet Commonly known as: ATIVAN   metFORMIN 500 MG tablet  Commonly known as: GLUCOPHAGE     TAKE these medications   acetaminophen 500 MG tablet Commonly known as: TYLENOL Take 1,000 mg by mouth every 4 (four) hours as needed for fever or  headache.   ascorbic acid 500 MG tablet Commonly known as: VITAMIN C Take 1 tablet (500 mg total) by mouth 2 (two) times daily for 14 days.   CeraVe Crea Apply 1 application topically 2 (two) times daily.   DIGESTIVE ENZYME PO Take 1 capsule by mouth 2 (two) times daily.   lidocaine-prilocaine cream Commonly known as: EMLA Apply 1 application topically as needed.   magnesium oxide 400 MG tablet Commonly known as: MAG-OX TAKE 1 TABLET BY MOUTH EVERY DAY   MiraLax 17 GM/SCOOP powder Generic drug: polyethylene glycol powder Take 1 Container by mouth daily.   ondansetron 4 MG disintegrating tablet Commonly known as: Zofran ODT 4mg  ODT q4 hours prn nausea/vomit What changed:   how much to take  how to take this  when to take this  reasons to take this  additional instructions   OVER THE COUNTER MEDICATION Take 240 mLs by mouth daily. Asca Liquid Supplement   OVER THE COUNTER MEDICATION 1,000 mg 2 (two) times daily. Graviola   OVER THE COUNTER MEDICATION CTFO super fruit chew   OVER THE COUNTER MEDICATION Alive Men's 50+ gummy   patiromer 8.4 g packet Commonly known as: VELTASSA Take 1 packet (8.4 g total) by mouth daily for 15 days. Start taking on: June 06, 2019   PROBIOTIC DAILY PO Take 1 capsule by mouth daily.   prochlorperazine 10 MG tablet Commonly known as: COMPAZINE TAKE 1 TABLET BY MOUTH EVERY 6 HOURS AS NEEDED FOR NAUSEA OR VOMITING. What changed: See the new instructions.   senna-docusate 8.6-50 MG tablet Commonly known as: Senna S Take 1 tablet by mouth 2 (two) times daily.   tolnaftate 1 % spray Commonly known as: TINACTIN Apply 1 spray topically daily as needed (for feet itching).   triamcinolone cream 0.1 % Commonly known as: KENALOG Apply 1 application topically 2 (two) times daily as needed (for itching).   UNABLE TO FIND 1,000 mg 3 (three) times daily. Med Name:AHCC   VITAMIN B-12 PO Take 4,000 mcg by mouth daily.    Vitamin D3 125 MCG (5000 UT) Caps Take 5,000 Units by mouth daily.   warfarin 5 MG tablet Commonly known as: Coumadin Take 1 tablet (5 mg total) by mouth daily.   Zinc 30 MG Tabs Take 1 tablet by mouth daily.       Allergies  Allergen Reactions  . Sulfa Antibiotics Hives    Consultations:  Urology, signed off, no further intervention, follow as outpatient   Procedures/Studies: CT ABDOMEN PELVIS WO CONTRAST  Result Date: 05/27/2019 CLINICAL DATA:  Diarrhea nausea and vomiting history of bladder cancer status post urostomy EXAM: CT ABDOMEN AND PELVIS WITHOUT CONTRAST TECHNIQUE: Multidetector CT imaging of the abdomen and pelvis was performed following the standard protocol without IV contrast. COMPARISON:  05/12/2019, 10/23/2018 FINDINGS: Lower chest: Lung bases demonstrate new small right-sided pleural effusion. Scattered areas of subpleural fibrosis. Punctate and nodular calcifications at the right lung base as before. Heart size is normal. Coronary vascular calcification. Hepatobiliary: Probable focal fat infiltration near falciform ligament. No calcified gallstone or biliary dilatation Pancreas: Unremarkable. No pancreatic ductal dilatation or surrounding inflammatory changes. Spleen: Normal in size without focal abnormality. Adrenals/Urinary Tract: Adrenal glands are normal. Complex cystic lesion exophytic to the lower pole right kidney with scattered  calcifications, measures up to 5.1 cm. Mild to moderate bilateral hydronephrosis and hydroureter appears slightly worse on the right side compared to most recent prior CT. Hydroureter visualized to ill-defined soft tissue density in the right lower quadrant of the abdomen with surgical suture. Stomach/Bowel: The stomach is nonenlarged. There is no dilated small bowel. Right lower quadrant ostomy. Negative appendix. No colon wall thickening. Vascular/Lymphatic: Nonaneurysmal aorta. Aortic atherosclerosis, moderate. Continued  retroperitoneal adenopathy. Left external iliac mass/adenopathy measuring 3.6 cm, grossly unchanged. Pelvic sidewall adenopathy measuring up to 18 mm on the left. Reproductive: Prostate surgically absent. Other: No free air. Small amount of ascites adjacent to the spleen and liver. Ill-defined soft tissue mass in the left lower quadrant/upper pelvis measuring 3.3 x 2.1 cm concerning for metastatic deposit. Irregular soft tissue density within the pelvis measuring approximately 6.8 x 1.6 cm, grossly similar compared to prior. Ill-defined soft tissue thickening and nodularity within the pelvis. Small amount of fluid and ill-defined soft tissue density in the right gutter. Multiple nodules or nodes in the lower abdomen and pelvis. Musculoskeletal: No new abnormality since 05/12/2019. Mild irregular lucency at the S3 segment of the sacrum. IMPRESSION: 1. Status post bladder resection with right lower quadrant urostomy. Mild to moderate bilateral hydronephrosis, grossly stable on the left and slightly worsened on the right since 05/12/2019. 2. New small right pleural effusion and new small amount of perihepatic and perisplenic ascites. 3. Grossly stable retroperitoneal, pelvic sidewall, and iliac adenopathy consistent with metastatic disease. Numerous pelvic nodules. Slight increased fluid and nodularity in the right gutter with persistent ill-defined soft tissue mass in the left pelvis, all consistent with metastatic disease. Grossly stable lobulated soft tissue density in the pelvis concerning for residual or recurrent disease. Irregular soft tissue density in the right lower quadrant with associated surgical sutures presumably related to history of urostomy surgery, though with slightly masslike features; additional metastatic disease cannot be excluded. Electronically Signed   By: Donavan Foil M.D.   On: 05/27/2019 23:04   CT ABDOMEN PELVIS WO CONTRAST  Result Date: 05/12/2019 CLINICAL DATA:  Abdominal pain.  Fever. Postop. History of bladder cancer. History of prostate cancer and prior TURP dated 06/16/2018. Bladder and prostate gland are eventually removed. The patient has a urostomy as of 12/15/2018 EXAM: CT ABDOMEN AND PELVIS WITHOUT CONTRAST TECHNIQUE: Multidetector CT imaging of the abdomen and pelvis was performed following the standard protocol without IV contrast. COMPARISON:  CT of the pelvis dated Oct 23, 2018 FINDINGS: Lower chest: The lung bases are clear. The heart size is normal. Hepatobiliary: There is a 2 cm hypoattenuating area in the left hepatic lobe. This is favored to represent focal fatty infiltration and was seen on the prior CT. Normal gallbladder.There is no biliary ductal dilation. Pancreas: Normal contours without ductal dilatation. No peripancreatic fluid collection. Spleen: No splenic laceration or hematoma. Adrenals/Urinary Tract: --Adrenal glands: No adrenal hemorrhage. --Right kidney/ureter: There is mild right-sided hydroureteronephrosis the level of the patient's urostomy. There is a complex cystic lesion involving the lower pole the right kidney which is relatively similar to prior CTs. --Left kidney/ureter: There is mild left-sided hydroureteronephrosis to the level the patient's urostomy. --Urinary bladder: The bladder surgically absent. Stomach/Bowel: --Stomach/Duodenum: No hiatal hernia or other gastric abnormality. Normal duodenal course and caliber. --Small bowel: No dilatation or inflammation. --Colon: There is a large amount of stool throughout the colon. There is no CT evidence for diverticulitis. There is narrowing at the hepatic flexure that is likely secondary to underdistention. Scattered colonic  diverticular noted --Appendix: Normal. Vascular/Lymphatic: Atherosclerotic calcification is present within the non-aneurysmal abdominal aorta, without hemodynamically significant stenosis. --there are multiple new pathologically enlarged retroperitoneal lymph nodes. For example  there is a 1.5 cm left periaortic lymph node (axial series 2, image 43). --there are no pathologically enlarged mesenteric lymph nodes. There is a amorphous soft tissue mass in the left lower quadrant measuring approximately 2.8 x 2.3 cm. --will pathologically enlarged pelvic lymph nodes. For example there is a left pelvic sidewall lymph node measuring 1.7 cm in the short axis (axial series 2, image 78). There is an amorphous soft tissue mass in the patient's pelvis measuring approximately 6.6 by 2.3 cm. Reproductive: The prostate gland is surgically absent. Other: No ascites or free air. A right lower quadrant urostomy is noted without evidence for obstruction. Musculoskeletal. There is a question subacute fracture through the S3 vertebral body. IMPRESSION: 1. No acute abnormality. 2. Extensive new pathologically enlarged retroperitoneal and pelvic lymph nodes concerning for development of extensive nodal metastatic disease. 3. There is a question of subacute fracture through the S3 vertebral body. 4. Mild bilateral hydronephrosis to the level of the patient's urostomy. Aortic Atherosclerosis (ICD10-I70.0). Electronically Signed   By: Constance Holster M.D.   On: 05/12/2019 01:36   XR Chest Single View  Result Date: 05/27/2019 CLINICAL DATA:  Weakness, nausea, vomiting, diarrhea EXAM: PORTABLE CHEST 1 VIEW COMPARISON:  05/12/2019 FINDINGS: There is a right-sided Port-A-Cath. There is right basilar atelectasis. There is no focal consolidation. There is no pleural effusion or pneumothorax. The heart and mediastinal contours are unremarkable. There is no acute osseous abnormality. IMPRESSION: Mild right basilar atelectasis. Otherwise no acute cardiopulmonary disease. Electronically Signed   By: Kathreen Devoid   On: 05/27/2019 19:49   DG Chest Port 1 View  Result Date: 05/12/2019 CLINICAL DATA:  Fever and weakness. EXAM: PORTABLE CHEST 1 VIEW COMPARISON:  Chest and left rib series 10/23/2018 FINDINGS: Right  chest port remains in place. Unchanged heart size and mediastinal contours. Mild bibasilar scarring. No confluent airspace disease. No pulmonary edema, large pleural effusion or pneumothorax. No acute osseous abnormalities are seen. IMPRESSION: No acute abnormality.  Mild bibasilar scarring. Electronically Signed   By: Keith Rake M.D.   On: 05/12/2019 00:50   VAS [place] LOWER EXTREMITY VENOUS (DVT)  Result Date: 05/28/2019  Lower Venous Study Indications: Elevated Ddimer.  Risk Factors: COVID 19 positive Cancer. Anticoagulation: Heparin. Limitations: Bowel gas. Comparison Study: No prior studies. Performing Technologist: Oliver Hum RVT  Examination Guidelines: A complete evaluation includes B-mode imaging, spectral Doppler, color Doppler, and power Doppler as needed of all accessible portions of each vessel. Bilateral testing is considered an integral part of a complete examination. Limited examinations for reoccurring indications may be performed as noted.  +---------+---------------+---------+-----------+----------+--------------+ RIGHT    CompressibilityPhasicitySpontaneityPropertiesThrombus Aging +---------+---------------+---------+-----------+----------+--------------+ CFV      Partial        No       No                   Acute          +---------+---------------+---------+-----------+----------+--------------+ SFJ      Full                                                        +---------+---------------+---------+-----------+----------+--------------+ FV Prox  Partial  No       No                   Acute          +---------+---------------+---------+-----------+----------+--------------+ FV Mid   Partial        No       No                   Acute          +---------+---------------+---------+-----------+----------+--------------+ FV DistalPartial        No       No                   Acute           +---------+---------------+---------+-----------+----------+--------------+ PFV                                                   Not visualized +---------+---------------+---------+-----------+----------+--------------+ POP      Full           No       No                                  +---------+---------------+---------+-----------+----------+--------------+ PTV      Full                                                        +---------+---------------+---------+-----------+----------+--------------+ PERO     Full                                                        +---------+---------------+---------+-----------+----------+--------------+ EIV      Full           No       No                                  +---------+---------------+---------+-----------+----------+--------------+ CIV                                                   Not visualized +---------+---------------+---------+-----------+----------+--------------+   +---------+---------------+---------+-----------+----------+--------------+ LEFT     CompressibilityPhasicitySpontaneityPropertiesThrombus Aging +---------+---------------+---------+-----------+----------+--------------+ CFV      Partial        No       No                   Acute          +---------+---------------+---------+-----------+----------+--------------+ SFJ      Full                                                        +---------+---------------+---------+-----------+----------+--------------+  FV Prox  Full                                                        +---------+---------------+---------+-----------+----------+--------------+ FV Mid   Partial        No       No                   Acute          +---------+---------------+---------+-----------+----------+--------------+ FV DistalPartial        No       No                   Acute           +---------+---------------+---------+-----------+----------+--------------+ PFV                                                   Not visualized +---------+---------------+---------+-----------+----------+--------------+ POP      Partial        No       No                   Acute          +---------+---------------+---------+-----------+----------+--------------+ PTV      Partial                                      Acute          +---------+---------------+---------+-----------+----------+--------------+ PERO     Full                                                        +---------+---------------+---------+-----------+----------+--------------+ Gastroc  None                                         Acute          +---------+---------------+---------+-----------+----------+--------------+ SSV      Full                                                        +---------+---------------+---------+-----------+----------+--------------+ EIV      Full           No       No                                  +---------+---------------+---------+-----------+----------+--------------+ CIV  Not visualized +---------+---------------+---------+-----------+----------+--------------+    Summary: Right: Findings consistent with acute deep vein thrombosis involving the right common femoral vein, and right femoral vein. No cystic structure found in the popliteal fossa. The external iliac vein appears patent. Left: Findings consistent with acute deep vein thrombosis involving the left common femoral vein, left femoral vein, left popliteal vein, left posterior tibial veins, and left gastrocnemius veins. No cystic structure found in the popliteal fossa. The external iliac vein appears patent.  *See table(s) above for measurements and observations. Electronically signed by Deitra Mayo MD on 05/28/2019 at 1:28:50 PM.    Final     Subjective: No acute events overnight. Would like to go home today. Eating more. No other issues at this time. No complaints. Has been having more BMs.   Discharge Exam: Vitals:   06/05/19 0626 06/05/19 1215  BP: (!) 160/76 139/64  Pulse: 85 82  Resp: 18 18  Temp: 99 F (37.2 C) 98.7 F (37.1 C)  SpO2: 94% 95%   Vitals:   06/04/19 1346 06/04/19 2006 06/05/19 0626 06/05/19 1215  BP: (!) 159/75 (!) 158/81 (!) 160/76 139/64  Pulse: 84 93 85 82  Resp: 19 (!) 23 18 18   Temp: 98.9 F (37.2 C) (!) 97.5 F (36.4 C) 99 F (37.2 C) 98.7 F (37.1 C)  TempSrc:      SpO2: 96% 96% 94% 95%  Weight:      Height:        General exam: Appears calm and comfortable , exam otherwise unchanged since 1/7 Respiratory system: Clear to auscultation. Respiratory effort normal. Cardiovascular system: S1 & S2 heard, RRR.  Gastrointestinal system: Abdomen is nondistended, passes gas, soft and nontender. No organomegaly or masses felt. Normal bowel sounds heard. Nephrostomy tube clean and intact.  Central nervous system: Alert and oriented. No focal neurological deficits. Extremities: Symmetric 5 x 5 power. Skin: No rashes, lesions or ulcers Psychiatry: Judgement and insight appear normal. Mood & affect appropriate.    The results of significant diagnostics from this hospitalization (including imaging, microbiology, ancillary and laboratory) are listed below for reference.     Microbiology: Recent Results (from the past 240 hour(s))  Blood Culture x 1     Status: None   Collection Time: 05/27/19  7:23 PM   Specimen: BLOOD RIGHT FOREARM  Result Value Ref Range Status   Specimen Description   Final    BLOOD RIGHT FOREARM BOTTLES DRAWN AEROBIC AND ANAEROBIC Blood Culture results may not be optimal due to an inadequate volume of blood received in culture bottles Performed at [facility], [place] [address], [place], [place] [address]    Special Requests   Final     NONE Performed at [facility], [place] [address], [place], [place] [address]    Culture   Final    NO GROWTH 5 DAYS Performed at [facility], [place] [address], [place], [place] [address]    Report Status 06/02/2019 FINAL  Final  Urine culture     Status: Abnormal   Collection Time: 05/27/19  7:23 PM   Specimen: Urine, Clean Catch  Result Value Ref Range Status   Specimen Description   Final    URINE, CLEAN CATCH Performed at [facility], [place] [address] [place], [place] [address]    Special Requests   Final    NONE Performed at [facility], [place] [address], [place], [place] [address]    Culture (A)  Final    90,000 COLONIES/mL PROTEUS MIRABILIS >=100,000 COLONIES/mL KLEBSIELLA PNEUMONIAE    Report Status 05/31/2019 FINAL  Final   Organism ID, Bacteria PROTEUS MIRABILIS (A)  Final   Organism ID, Bacteria KLEBSIELLA PNEUMONIAE (A)  Final      Susceptibility   Klebsiella pneumoniae - MIC*    AMPICILLIN >=32 RESISTANT Resistant     CEFAZOLIN <=4 SENSITIVE Sensitive     CEFTRIAXONE <=0.25 SENSITIVE Sensitive     CIPROFLOXACIN <=0.25 SENSITIVE Sensitive     GENTAMICIN <=1 SENSITIVE Sensitive     IMIPENEM <=0.25 SENSITIVE Sensitive     NITROFURANTOIN 128 RESISTANT Resistant     TRIMETH/SULFA <=20 SENSITIVE Sensitive     AMPICILLIN/SULBACTAM 8 SENSITIVE Sensitive     PIP/TAZO <=4 SENSITIVE Sensitive     * >=100,000 COLONIES/mL KLEBSIELLA PNEUMONIAE   Proteus mirabilis - MIC*    AMPICILLIN <=2 SENSITIVE Sensitive     CEFAZOLIN <=4 SENSITIVE Sensitive     CEFTRIAXONE <=0.25 SENSITIVE Sensitive     CIPROFLOXACIN <=0.25 SENSITIVE Sensitive     GENTAMICIN <=1 SENSITIVE Sensitive     IMIPENEM 2 SENSITIVE Sensitive     NITROFURANTOIN 128 RESISTANT Resistant     TRIMETH/SULFA <=20 SENSITIVE Sensitive     AMPICILLIN/SULBACTAM <=2 SENSITIVE Sensitive     PIP/TAZO <=4 SENSITIVE Sensitive     * 90,000 COLONIES/mL PROTEUS  MIRABILIS  GI pathogen panel by PCR, stool     Status: None   Collection Time: 05/27/19  9:32 PM   Specimen: Stool  Result Value Ref Range Status   Plesiomonas shigelloides NOT DETECTED NOT DETECTED Final   Yersinia enterocolitica NOT DETECTED NOT DETECTED Final   Vibrio NOT DETECTED NOT DETECTED Final   Enteropathogenic E coli NOT DETECTED NOT DETECTED Final   E coli (ETEC) LT/ST NOT DETECTED NOT DETECTED Final   E coli 2694 by PCR Not applicable NOT DETECTED Final   Cryptosporidium by PCR NOT DETECTED NOT DETECTED Final   Entamoeba histolytica NOT DETECTED NOT DETECTED Final   Adenovirus F 40/41 NOT DETECTED NOT DETECTED Final   Norovirus GI/GII NOT DETECTED NOT DETECTED Final   Sapovirus NOT DETECTED NOT DETECTED Final    Comment: (NOTE) Performed At: [facility] [place], [place] [id number] Rush Farmer MD KK:[id number]    Vibrio cholerae NOT DETECTED NOT DETECTED Final   Campylobacter by PCR NOT DETECTED NOT DETECTED Final   Salmonella by PCR NOT DETECTED NOT DETECTED Final   E coli (STEC) NOT DETECTED NOT DETECTED Final   Enteroaggregative E coli NOT DETECTED NOT DETECTED Final   Shigella by PCR NOT DETECTED NOT DETECTED Final   Cyclospora cayetanensis NOT DETECTED NOT DETECTED Final   Astrovirus NOT DETECTED NOT DETECTED Final   G lamblia by PCR NOT DETECTED NOT DETECTED Final   Rotavirus A by PCR NOT DETECTED NOT DETECTED Final  C Difficile Quick Screen w PCR reflex     Status: None   Collection Time: 05/27/19  9:32 PM   Specimen: Stool  Result Value Ref Range Status   C Diff antigen NEGATIVE NEGATIVE Final   C Diff toxin NEGATIVE NEGATIVE Final   C Diff interpretation No C. difficile detected.  Final    Comment: Performed at [facility], [place] [address] [place], [place] [address]     Labs: BNP (last 3 results) No results for input(s): BNP in the last 8760 hours. Basic Metabolic Panel: Recent Labs  Lab 06/01/19 0358  06/02/19 0803 06/02/19 1146  06/04/19 0313 06/05/19 0319  NA 137 139 137 134* 133*  K 4.6 4.0 4.9 5.1 5.4*  CL 110 108 106 105 105  CO2 18* 24 21* 19* 19*  GLUCOSE 134* 128* 137* 160* 134*  BUN 45* 26* 46* 48* 47*  CREATININE 2.38* 0.80 2.33* 2.38* 2.38*  CALCIUM 8.6* 9.1 8.9 9.0 9.1   Liver Function Tests: No results for input(s): AST, ALT, ALKPHOS, BILITOT, PROT, ALBUMIN in the last 168 hours. No results for input(s): LIPASE, AMYLASE in the last 168 hours. No results for input(s): AMMONIA in the last 168 hours. CBC: Recent Labs  Lab 06/01/19 0358 06/02/19 0343 06/03/19 0345 06/04/19 0313 06/05/19 0319  WBC 17.7* 18.3* 18.1* 16.8* 18.3*  HGB 8.6* 8.6* 9.0* 8.3* 7.9*  HCT 27.3* 26.4* 28.7* 26.7* 24.9*  MCV 93.8 94.6 96.0 94.7 95.4  PLT 389 366 412* 403* 341   Cardiac Enzymes: No results for input(s): CKTOTAL, CKMB, CKMBINDEX, TROPONINI in the last 168 hours. BNP: Invalid input(s): POCBNP CBG: Recent Labs  Lab 06/04/19 1224 06/04/19 1700 06/04/19 2133 06/05/19 0753 06/05/19 1213  GLUCAP 168* 135* 236* 140* 180*   D-Dimer No results for input(s): DDIMER in the last 72 hours. Hgb A1c No results for input(s): HGBA1C in the last 72 hours. Lipid Profile No results for input(s): CHOL, HDL, LDLCALC, TRIG, CHOLHDL, LDLDIRECT in the last 72 hours. Thyroid function studies No results for input(s): TSH, T4TOTAL, T3FREE, THYROIDAB in the last 72 hours.  Invalid input(s): FREET3 Anemia work up No results for input(s): VITAMINB12, FOLATE, FERRITIN, TIBC, IRON, RETICCTPCT in the last 72 hours. Urinalysis    Component Value Date/Time   COLORURINE YELLOW (A) 05/27/2019 1923   APPEARANCEUR TURBID (A) 05/27/2019 1923   LABSPEC 1.013 05/27/2019 1923   PHURINE 7.0 05/27/2019 1923   GLUCOSEU NEGATIVE 05/27/2019 1923   GLUCOSEU > or = 1000 mg/dL (AA) 03/01/2008 1109   HGBUR MODERATE (A) 05/27/2019 1923   BILIRUBINUR NEGATIVE 05/27/2019 1923   KETONESUR NEGATIVE 05/27/2019  1923   PROTEINUR 100 (A) 05/27/2019 1923   UROBILINOGEN 0.2 mg/dL 03/01/2008 1109   NITRITE NEGATIVE 05/27/2019 1923   LEUKOCYTESUR LARGE (A) 05/27/2019 1923   Sepsis Labs Invalid input(s): PROCALCITONIN,  WBC,  LACTICIDVEN Microbiology Recent Results (from the past 240 hour(s))  Blood Culture x 1     Status: None   Collection Time: 05/27/19  7:23 PM   Specimen: BLOOD RIGHT FOREARM  Result Value Ref Range Status   Specimen Description   Final    BLOOD RIGHT FOREARM BOTTLES DRAWN AEROBIC AND ANAEROBIC Blood Culture results may not be optimal due to an inadequate volume of blood received in culture bottles Performed at [facility], [place] [address] [place], [place] [address]    Special Requests   Final    NONE Performed at [facility], [place] [address], [place], [place] [address]    Culture   Final    NO GROWTH 5 DAYS Performed at [facility], [place] [address], [place], [place] [address]    Report Status 06/02/2019 FINAL  Final  Urine culture     Status: Abnormal   Collection Time: 05/27/19  7:23 PM   Specimen: Urine, Clean Catch  Result Value Ref Range Status   Specimen Description   Final    URINE, CLEAN CATCH Performed at [facility], [place] [address] [place], [place] [address]    Special Requests   Final    NONE Performed at  [facility], [place] [address], [place], [place] [address]    Culture (A)  Final    90,000 COLONIES/mL PROTEUS MIRABILIS >=100,000 COLONIES/mL KLEBSIELLA PNEUMONIAE    Report Status 05/31/2019 FINAL  Final   Organism ID, Bacteria PROTEUS MIRABILIS (A)  Final   Organism ID, Bacteria KLEBSIELLA PNEUMONIAE (A)  Final      Susceptibility   Klebsiella pneumoniae - MIC*    AMPICILLIN >=32 RESISTANT Resistant     CEFAZOLIN <=4 SENSITIVE Sensitive     CEFTRIAXONE <=0.25 SENSITIVE Sensitive     CIPROFLOXACIN <=0.25 SENSITIVE Sensitive     GENTAMICIN <=1 SENSITIVE Sensitive      IMIPENEM <=0.25 SENSITIVE Sensitive     NITROFURANTOIN 128 RESISTANT Resistant     TRIMETH/SULFA <=20 SENSITIVE Sensitive     AMPICILLIN/SULBACTAM 8 SENSITIVE Sensitive     PIP/TAZO <=4 SENSITIVE Sensitive     * >=100,000 COLONIES/mL KLEBSIELLA PNEUMONIAE   Proteus mirabilis - MIC*    AMPICILLIN <=2 SENSITIVE Sensitive     CEFAZOLIN <=4 SENSITIVE Sensitive     CEFTRIAXONE <=0.25 SENSITIVE Sensitive     CIPROFLOXACIN <=0.25 SENSITIVE Sensitive     GENTAMICIN <=1 SENSITIVE Sensitive     IMIPENEM 2 SENSITIVE Sensitive     NITROFURANTOIN 128 RESISTANT Resistant     TRIMETH/SULFA <=20 SENSITIVE Sensitive     AMPICILLIN/SULBACTAM <=2 SENSITIVE Sensitive     PIP/TAZO <=4 SENSITIVE Sensitive     * 90,000 COLONIES/mL PROTEUS MIRABILIS  GI pathogen panel by PCR, stool     Status: None   Collection Time: 05/27/19  9:32 PM   Specimen: Stool  Result Value Ref Range Status   Plesiomonas shigelloides NOT DETECTED NOT DETECTED Final   Yersinia enterocolitica NOT DETECTED NOT DETECTED Final   Vibrio NOT DETECTED NOT DETECTED Final   Enteropathogenic E coli NOT DETECTED NOT DETECTED Final   E coli (ETEC) LT/ST NOT DETECTED NOT DETECTED Final   E coli 8527 by PCR Not applicable NOT DETECTED Final   Cryptosporidium by PCR NOT DETECTED NOT DETECTED Final   Entamoeba histolytica NOT DETECTED NOT DETECTED Final   Adenovirus F 40/41 NOT DETECTED NOT DETECTED Final   Norovirus GI/GII NOT DETECTED NOT DETECTED Final   Sapovirus NOT DETECTED NOT DETECTED Final    Comment: (NOTE) Performed At: [facility] [place], [place] [id number] Rush Farmer MD RW:[id number]    Vibrio cholerae NOT DETECTED NOT DETECTED Final   Campylobacter by PCR NOT DETECTED NOT DETECTED Final   Salmonella by PCR NOT DETECTED NOT DETECTED Final   E coli (STEC) NOT DETECTED NOT DETECTED Final   Enteroaggregative E coli NOT DETECTED NOT DETECTED Final   Shigella by PCR NOT DETECTED NOT DETECTED Final    Cyclospora cayetanensis NOT DETECTED NOT DETECTED Final   Astrovirus NOT DETECTED NOT DETECTED Final   G lamblia by PCR NOT DETECTED NOT DETECTED Final   Rotavirus A by PCR NOT DETECTED NOT DETECTED Final  C Difficile Quick Screen w PCR reflex     Status: None   Collection Time: 05/27/19  9:32 PM   Specimen: Stool  Result Value Ref Range Status   C Diff antigen NEGATIVE NEGATIVE Final   C Diff toxin NEGATIVE NEGATIVE Final   C Diff interpretation No C. difficile detected.  Final    Comment: Performed at [facility], [place] [address], [place], [place] [address]    Time coordinating discharge: Over 30 minutes  SIGNED:  Arma Heading,  MD  [facility] 06/05/2019, 7:06 PM Pager   If 7PM-7AM, please contact night-coverage [email or web address] Password [email or web address]

## 2019-06-06 ENCOUNTER — Encounter (INDEPENDENT_AMBULATORY_CARE_PROVIDER_SITE_OTHER): Payer: Self-pay

## 2019-06-07 ENCOUNTER — Encounter (INDEPENDENT_AMBULATORY_CARE_PROVIDER_SITE_OTHER): Payer: Self-pay

## 2019-06-08 ENCOUNTER — Encounter (INDEPENDENT_AMBULATORY_CARE_PROVIDER_SITE_OTHER): Payer: Self-pay

## 2019-06-08 ENCOUNTER — Telehealth: Payer: Self-pay | Admitting: *Deleted

## 2019-06-08 NOTE — Telephone Encounter (Signed)
Transition Care Management Follow-up Telephone Call   Date discharged? 06/05/19   How have you been since you were released from the hospital? "A little weak, but okay."   Do you understand why you were in the hospital? yes   Do you understand the discharge instructions? yes   Where were you discharged to? Home.   Items Reviewed:  Medications reviewed: yes  Allergies reviewed: yes  Dietary changes reviewed: yes  Referrals reviewed: yes   Functional Questionnaire:   Activities of Daily Living (ADLs):   He states they are independent in the following: ambulation, bathing and hygiene, feeding, continence, grooming, toileting and dressing States they require assistance with the following: na   Any transportation issues/concerns?: no   Any patient concerns? no   Confirmed importance and date/time of follow-up visits scheduled yes  Provider Appointment booked with PCP 06/11/19  Confirmed with patient if condition begins to worsen call PCP or go to the ER.  Patient was given the office number and encouraged to call back with question or concerns.  : yes

## 2019-06-09 ENCOUNTER — Other Ambulatory Visit: Payer: Self-pay | Admitting: Family Medicine

## 2019-06-09 ENCOUNTER — Encounter (INDEPENDENT_AMBULATORY_CARE_PROVIDER_SITE_OTHER): Payer: Self-pay

## 2019-06-09 ENCOUNTER — Telehealth: Payer: Self-pay

## 2019-06-09 DIAGNOSIS — E119 Type 2 diabetes mellitus without complications: Secondary | ICD-10-CM

## 2019-06-09 NOTE — Telephone Encounter (Signed)
Wife reports pt. Had been constipated "for about a week and he finally had a bowel movement yesterday and it was loose, but I've been giving him Miralax.So he is doing fine in that area." States he has a poor appetite. She is giving him protein drinks. States he was told in the hospital he "is anemic and wonders if he needs iron pills." Asking if he can have Home PT for weakness. Has use [facility] in the past.

## 2019-06-09 NOTE — Telephone Encounter (Signed)
Spoke with patients wife, [name].  Wife states constipation issues have resolved.  Appetite not great, continuing supplemental drinks.  PT through [facility] to visit patient tomorrow (06/10/2019).  F/U with PCP on Thursday, 06/11/2019, will plan to discuss anemia/supplements at that time.

## 2019-06-10 ENCOUNTER — Encounter: Payer: Self-pay | Admitting: Family Medicine

## 2019-06-10 ENCOUNTER — Encounter (INDEPENDENT_AMBULATORY_CARE_PROVIDER_SITE_OTHER): Payer: Self-pay

## 2019-06-10 ENCOUNTER — Other Ambulatory Visit: Payer: Self-pay

## 2019-06-10 NOTE — Progress Notes (Addendum)
[facility] [address], [place], [place] [address] [phone]  Date:  06/11/2019   Name:  Sergio Pennington   DOB:  [birth year]   MRN:  [id number]  PCP:  Darreld Mclean, MD    Chief Complaint: INR check and Diabetes   History of Present Illness:  Sergio Pennington is a 69 y.o. very pleasant male patient who presents with the following:  Patient goes by the name Sergio Pennington Here today for hospital follow-up visit Patient with history of diabetes, bladder cancer, chronic kidney disease, hyperlipidemia  He had a bladder resection with ileostomy performed by his urologist, Dr. Tresa Moore, in August 2020  Diabetes well controlled, recent A1c 6.4%  He was recently admitted for over a week with weakness and COVID-19 infection He was started on Coumadin for DVT, needs INR checked today He never had any specific symptoms of COVID-19, he had weakness and fatigue He was diagnosed with a Klebsiella UTI, treated with Augmentin which she has now finished He does have a urostomy During admit he was noted to have acute kidney injury and bilateral hydronephrosis-likely needs referral to nephrology for further evaluation and follow-up  Admit date: 05/27/2019 Discharge date: 06/05/2019 Recommendations for Outpatient Follow-up:  1. Follow up with PCP next Thursday 2. Continue warfarin, adjust dosage based on INR, check INR 3. Please obtain BMP/CBC in one week 4. Continue SPS or veltassa as needed, check potassium, address constipation 5. If kidney function not improved, likely needs referral to nephrology 6. Needs to follow with urology for nephrostomy tubes 7. Ensure resolution of covid, never had any symptoms 8. Had DVT, likely trigger by covid, plan for 3 month treatment and reasses need for longterm [facility]  Home Health: Yes Equipment/Devices: Foley  Diet recommendation:Regular as tolerated as patient does not eat  otherwise  Brief/Interim Summary: Per previous attending68 y.o.malewith medical history significant ofbladder cancer status post urostomy, type 2 diabetes, hypertension, hyperlipidemia presenting to the ED for evaluation of generalized weakness.Symptoms started 2 weeks ago. He has had generalized weakness and fatigue. Intermittent episodes of nonbloody emesis and nonbloody diarrhea. He is having 3-5/10intensity generalized abdominal pain and intermittent fevers at home. He is coughing a little. Denies shortness of breath or chest pain. No additional history could be obtained from the patient as he started vomiting.  ED Course:Afebrile and hemodynamically stable. Oxygen saturation 97-100% on room air. SARS-CoV-2 rapid antigen test positive. WBC count 18.3. Lactic acid normal. Hemoglobin 8.9, ranging between 10.4-11 on recent labs. Sodium 133. BUN 67, creatinine 3.1. Creatinine ranging between 1.6-1.9 on recent labs. LFTs normal. UA with large amount of leukocytes, 21-50 RBCs, greater than 50 WBCs, and few bacteria. Urine culture pending. Blood culture x2 pending. C. difficile PCR and GI pathogenordered ,Chest x-ray showing mild right basilar atelectasis and no focal consolidation. Patient received 1.5 L normal saline boluses.  1/5 Starting warfarin. Suppository for constipation, some nausea. 1/6 Noticed some trickle of blood in foley, resolved on its own. Creatinine not improved at all 17/ Creatinine stable, warfarin ongoing, no signs of bleeding 1/8: INR therapeutic, stop heparin, discharge w/ follow-up  Discharge Diagnoses:  Principal Problem:   COVID-19 virus infection Active Problems:   Anemia   CKD (chronic kidney disease), stage III  I also received the following update from pts wife yesterday: In preparation for tomorrow's visit.... How to increase Mc's appetite?  How to increase amount he drinks? What is acceptable for him to  eat.  He was told small  amounts at the hospital.  Is that small amounts of everything or just foods high in potassium? One dr said he was "chronically anemic".  How to fix that? Home PT person suggested a light weight transport chair. Apparently you write the script for that, faxing it to [place]. We have never had a glucose monitor.  Does he need one?  He is not now on metformin, since being discharged from hospital. I so appreciate you and your office's responsiveness. Thanking you in advance!  They do not own a scale- encouraged them to get one to check daily weights Laying flat hurts his back He is using 1-2 pillows at night   He was taking metformin 500 BID, then QD- this was stopped due to his renal function- now not on DM meds  CT 12/30 IMPRESSION: 1. Status post bladder resection with right lower quadrant urostomy. Mild to moderate bilateral hydronephrosis, grossly stable on the left and slightly worsened on the right since 05/12/2019. 2. New small right pleural effusion and new small amount of perihepatic and perisplenic ascites. 3. Grossly stable retroperitoneal, pelvic sidewall, and iliac adenopathy consistent with metastatic disease. Numerous pelvic nodules. Slight increased fluid and nodularity in the right gutter with persistent ill-defined soft tissue mass in the left pelvis, all consistent with metastatic disease. Grossly stable lobulated soft tissue density in the pelvis concerning for residual or recurrent disease. Irregular soft tissue density in the right lower quadrant with associated surgical sutures presumably related to history of urostomy surgery, though with slightly masslike features; additional metastatic disease cannot be excluded.  LE [place] 12/31 Summary: Right: Findings consistent with acute deep vein thrombosis involving the right common femoral vein, and right femoral vein. No cystic structure found in the popliteal fossa. The external iliac vein appears patent. Left:  Findings consistent with acute deep vein thrombosis involving the left common femoral vein, left femoral vein, left popliteal vein, left posterior tibial veins, and left gastrocnemius veins. No cystic structure found in the popliteal fossa. The  external iliac vein appears patent.    Patient Active Problem List   Diagnosis Date Noted  . CKD (chronic kidney disease), stage III 06/05/2019  . COVID-19 virus infection 05/27/2019  . Anemia 05/27/2019  . Port-A-Cath in place 07/03/2018  . Bladder cancer ([place]) 06/17/2018  . DIAB W/O MENTION COMP TYPE II/UNS TYPE UNCNTRL 03/08/2008  . DIABETES MELLITUS, TYPE II, UNCONTROLLED, W/NEUROLO COMPS 03/08/2008  . HYPERLIPIDEMIA 03/08/2008  . TOBACCO ABUSE 03/08/2008    Past Medical History:  Diagnosis Date  . Bladder cancer ([place])   . Cellulitis    Left leg   . Constipation   . Diabetes mellitus without complication ([place])   . Diabetic neuropathy ([place])    Feet  . Hematuria 04/21/2018  . Hyperlipidemia   . 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 Vasculitis ([place])    Left leg    Past Surgical History:  Procedure Laterality Date  . CYSTOSCOPY WITH INJECTION N/A 11/26/2018   Procedure: CYSTOSCOPY WITH INJECTION OF INDOCYANINE GREEN DYE;  Surgeon: Alexis Frock, MD;  Location: [facility];  Service: Urology;  Laterality: N/A;  . IR IMAGING GUIDED PORT INSERTION  06/26/2018  . MOUTH SURGERY  1980  . TONSILLECTOMY     4th grade  . TRANSURETHRAL RESECTION OF BLADDER TUMOR N/A 06/16/2018   Procedure: TRANSURETHRAL RESECTION OF BLADDER TUMOR (TURBT);  Surgeon: [place] Mallow, MD;  Location: [facility];  Service: Urology;  Laterality: N/A;    Social History   Tobacco Use  . Smoking status: Former Research scientist (life sciences)  . Smokeless tobacco: Never Used  Substance Use Topics  . Alcohol use: Not Currently    Alcohol/week: 1.0 standard drinks    Types: 1 Glasses of wine per week  . Drug use: Not Currently    History reviewed. No  pertinent family history.  Allergies  Allergen Reactions  . Sulfa Antibiotics Hives    Medication list has been reviewed and updated.  Current Outpatient Medications on File Prior to Visit  Medication Sig Dispense Refill  . acetaminophen (TYLENOL) 500 MG tablet Take 1,000 mg by mouth every 6 (six) hours.     Marland Kitchen ascorbic acid (VITAMIN C) 500 MG tablet Take 1 tablet (500 mg total) by mouth 2 (two) times daily for 14 days. 28 tablet 0  . Cholecalciferol (VITAMIN D3) 125 MCG (5000 UT) CAPS Take 5,000 Units by mouth daily.    . Cyanocobalamin (VITAMIN B-12 PO) Take 4,000 mcg by mouth daily.     . magnesium oxide (MAG-OX) 400 MG tablet TAKE 1 TABLET BY MOUTH EVERY DAY 30 tablet 0  . ondansetron (ZOFRAN ODT) 4 MG disintegrating tablet 67m ODT q4 hours prn nausea/vomit 20 tablet 0  . polyethylene glycol powder (MIRALAX) 17 GM/SCOOP powder Take 1 Container by mouth daily.     . Probiotic Product (PROBIOTIC DAILY PO) Take 1 capsule by mouth daily.    . prochlorperazine (COMPAZINE) 10 MG tablet TAKE 1 TABLET BY MOUTH EVERY 6 HOURS AS NEEDED FOR NAUSEA OR VOMITING. (Patient taking differently: Take 10 mg by mouth every 6 (six) hours as needed for nausea or vomiting. ) 30 tablet 0  . senna-docusate (SENNA S) 8.6-50 MG tablet Take 1 tablet by mouth 2 (two) times daily. 90 tablet 3  . UNABLE TO FIND 1,000 mg 3 (three) times daily. Med Name:[facility]    . warfarin (COUMADIN) 5 MG tablet Take 1 tablet (5 mg total) by mouth daily. 30 tablet 11  . Zinc 30 MG TABS Take 1 tablet by mouth daily.     . patiromer (VELTASSA) 8.4 g packet Take 1 packet (8.4 g total) by mouth daily for 15 days. (Patient not taking: Reported on 06/11/2019) 15 packet 0   No current facility-administered medications on file prior to visit.    Review of Systems:  As per HPI- otherwise negative.   Physical Examination: Vitals:   06/11/19 0952  BP: 128/62  Pulse: 75  Resp: 17  Temp: 97.8 F (36.6 C)  SpO2: 97%   Vitals:    06/11/19 0952  Weight: 180 lb (81.6 kg)  Height: 6' (1.829 m)   Body mass index is 24.41 kg/m. Ideal Body Weight: Weight in (lb) to have BMI = 25: 183.9  GEN: WDWN, NAD, Non-toxic, A & O x 3, here today accompanied by his wife I have not seen this patient in several months.  He appears now to be chronically ill in keeping with metastatic cancer HEENT: Atraumatic, Normocephalic. Neck supple. No masses, No LAD. Ears and Nose: No external deformity. CV: RRR, No M/G/R. No JVD. No thrill. No extra heart sounds. PULM: CTA B, no wheezes, crackles, rhonchi. No retractions. No resp. distress. No accessory muscle use. ABD: S, NT, ND, +BS. No rebound. No HSM.  Ileostomy in right lower quadrant, appears to be in good repair EXTR: No c/c/he has mild soft swelling of both lower extremities, known right DVT  NEURO Normal gait.  PSYCH: Normally interactive. Conversant. Not depressed or anxious appearing.  Calm demeanor.   Lab Results  Component Value Date   HGBA1C 6.4 (H) 05/27/2019   Results for orders placed or performed in visit on 06/11/19  CBC  Result Value Ref Range   WBC 11.9 (H) 4.0 - 10.5 K/uL   RBC 3.04 (L) 4.22 - 5.81 Mil/uL   Platelets 325.0 150.0 - 400.0 K/uL   Hemoglobin 9.0 (L) 13.0 - 17.0 g/dL   HCT 27.4 (L) 39.0 - 52.0 %   MCV 90.3 78.0 - 100.0 fl   MCHC 32.9 30.0 - 36.0 g/dL   RDW 15.3 11.5 - 85.2 %  Basic metabolic panel  Result Value Ref Range   Sodium 133 (L) 135 - 145 mEq/L   Potassium 5.7 No hemolysis seen (H) 3.5 - 5.1 mEq/L   Chloride 99 96 - 112 mEq/L   CO2 23 19 - 32 mEq/L   Glucose, Bld 132 (H) 70 - 99 mg/dL   BUN 62 (H) 6 - 23 mg/dL   Creatinine, Ser 3.12 (H) 0.40 - 1.50 mg/dL   GFR 19.82 (L) >60.00 mL/min   Calcium 8.7 8.4 - 10.5 mg/dL  POCT INR  Result Value Ref Range   INR 5.9 (A) 2.0 - 3.0    Assessment and Plan: Hospital discharge follow-up  Malignant neoplasm of overlapping sites of bladder [facility])  Renal insufficiency - Plan: Basic metabolic  panel  Diabetes mellitus without complication ([place]) - Plan: CBC, Basic metabolic panel  Anticoagulated on Coumadin - Plan: POCT INR  In person visit today to follow-up on recent hospitalization with complications of [id number] Patient also has metastatic bladder cancer, status post bladder resection with ileostomy  His recent course has been complicated by acute renal insufficiency He is also now taking Coumadin for a newly diagnosed DVT, likely due to hypercoagulability from cancer and COVID-19  Today Sergio Pennington is feeling about the same, tired and worn out but no acute change in his symptoms  His INR is supratherapeutic, discussed plans to adjust Coumadin Ideally would like to hold Coumadin for 2 to 3 days and then recheck INR, but today is Thursday.  As such we will have him hold Coumadin for 2 doses, then start back at 2.5 mg daily.  Appointment to recheck INR scheduled for Monday Otherwise await his labs  Medical decision making today is high This visit occurred during the SARS-CoV-2 public health emergency.  Safety protocols were in place, including screening questions prior to the visit, additional usage of staff PPE, and extensive cleaning of exam room while observing appropriate contact time as indicated for disinfecting solutions.   Oncology appt we think next month-I will touch base with his oncologist to make sure they are up-to-date about his current care Signed Lamar Blinks, MD ----------------------------------------------- Received his labs at approximately 1 PM, as above.  Renal function is worsening I called nephrology and spoke with Dr Joelyn Oms who kindly reviewed his case with me over the phone He needs to be seen in the ER to see if he may have a renal obstruction with worsening of his creatinine-this may be a complication of metastatic disease He also has some hyperkalemia  Called and relayed this information to patient and his wife, they are okay with being seen in the  emergency department.  I called and gave brief report to the EDP here at the med center-appreciate emergency department care of this patient    addnd 1/15- heard back  from Dr Alen Blew re: anticoagulation     3 months should be fine. Eliquis or Xarelto would be reasonable alternatives as well. Thanks for the update.

## 2019-06-10 NOTE — Patient Instructions (Addendum)
It was great to see you again today, I will be in touch with your lab results ASAP I do not think you necessarily need to check your blood sugar on a regular basis right now- however if you wish to check your glucose on occasion with a home meter that is ok with me   Results for orders placed or performed in visit on 06/11/19  POCT INR  Result Value Ref Range   INR 5.9 (A) 2.0 - 3.0   We are going to adjust your coumadin as follows Current dose is 5 mg daily = 35 per week Take no coumadin today and tomorrow. Then we will restart 2.5 mg on Saturday.  We will recheck your INR on Monday - please see me at your convenience on Monday, about 10 am would be ideal   I will touch base with your oncologist about your blood counts and how long you need to be on coumadin  Continue to NOT take metformin- watch your dietary sugars

## 2019-06-11 ENCOUNTER — Emergency Department (HOSPITAL_BASED_OUTPATIENT_CLINIC_OR_DEPARTMENT_OTHER): Payer: Medicare Other

## 2019-06-11 ENCOUNTER — Inpatient Hospital Stay (HOSPITAL_BASED_OUTPATIENT_CLINIC_OR_DEPARTMENT_OTHER)
Admission: EM | Admit: 2019-06-11 | Discharge: 2019-06-17 | DRG: 683 | Disposition: A | Discharge status: Home Health | Payer: Medicare Other | Source: Ambulatory Visit | Attending: Internal Medicine | Admitting: Internal Medicine

## 2019-06-11 ENCOUNTER — Ambulatory Visit (INDEPENDENT_AMBULATORY_CARE_PROVIDER_SITE_OTHER): Payer: Medicare Other | Admitting: Family Medicine

## 2019-06-11 ENCOUNTER — Other Ambulatory Visit: Payer: Self-pay

## 2019-06-11 ENCOUNTER — Encounter (HOSPITAL_BASED_OUTPATIENT_CLINIC_OR_DEPARTMENT_OTHER): Payer: Self-pay | Admitting: *Deleted

## 2019-06-11 ENCOUNTER — Encounter: Payer: Self-pay | Admitting: Family Medicine

## 2019-06-11 ENCOUNTER — Encounter (INDEPENDENT_AMBULATORY_CARE_PROVIDER_SITE_OTHER): Payer: Self-pay

## 2019-06-11 VITALS — BP 128/62 | HR 75 | Temp 97.8°F | Resp 17 | Ht 72.0 in | Wt 180.0 lb

## 2019-06-11 DIAGNOSIS — I82409 Acute embolism and thrombosis of unspecified deep veins of unspecified lower extremity: Secondary | ICD-10-CM

## 2019-06-11 DIAGNOSIS — E119 Type 2 diabetes mellitus without complications: Secondary | ICD-10-CM | POA: Diagnosis not present

## 2019-06-11 DIAGNOSIS — N390 Urinary tract infection, site not specified: Secondary | ICD-10-CM | POA: Diagnosis not present

## 2019-06-11 DIAGNOSIS — Z7901 Long term (current) use of anticoagulants: Secondary | ICD-10-CM

## 2019-06-11 DIAGNOSIS — N289 Disorder of kidney and ureter, unspecified: Secondary | ICD-10-CM

## 2019-06-11 DIAGNOSIS — D509 Iron deficiency anemia, unspecified: Secondary | ICD-10-CM | POA: Diagnosis present

## 2019-06-11 DIAGNOSIS — Z86718 Personal history of other venous thrombosis and embolism: Secondary | ICD-10-CM

## 2019-06-11 DIAGNOSIS — Z882 Allergy status to sulfonamides status: Secondary | ICD-10-CM | POA: Diagnosis not present

## 2019-06-11 DIAGNOSIS — Z8616 Personal history of COVID-19: Secondary | ICD-10-CM | POA: Diagnosis not present

## 2019-06-11 DIAGNOSIS — E8809 Other disorders of plasma-protein metabolism, not elsewhere classified: Secondary | ICD-10-CM | POA: Diagnosis present

## 2019-06-11 DIAGNOSIS — Z87891 Personal history of nicotine dependence: Secondary | ICD-10-CM

## 2019-06-11 DIAGNOSIS — I129 Hypertensive chronic kidney disease with stage 1 through stage 4 chronic kidney disease, or unspecified chronic kidney disease: Secondary | ICD-10-CM | POA: Diagnosis present

## 2019-06-11 DIAGNOSIS — N184 Chronic kidney disease, stage 4 (severe): Secondary | ICD-10-CM | POA: Diagnosis present

## 2019-06-11 DIAGNOSIS — D631 Anemia in chronic kidney disease: Secondary | ICD-10-CM | POA: Diagnosis present

## 2019-06-11 DIAGNOSIS — Z515 Encounter for palliative care: Secondary | ICD-10-CM | POA: Diagnosis not present

## 2019-06-11 DIAGNOSIS — Z09 Encounter for follow-up examination after completed treatment for conditions other than malignant neoplasm: Secondary | ICD-10-CM

## 2019-06-11 DIAGNOSIS — E1122 Type 2 diabetes mellitus with diabetic chronic kidney disease: Secondary | ICD-10-CM | POA: Diagnosis present

## 2019-06-11 DIAGNOSIS — N136 Pyonephrosis: Secondary | ICD-10-CM | POA: Diagnosis present

## 2019-06-11 DIAGNOSIS — E871 Hypo-osmolality and hyponatremia: Secondary | ICD-10-CM | POA: Diagnosis present

## 2019-06-11 DIAGNOSIS — N183 Chronic kidney disease, stage 3 unspecified: Secondary | ICD-10-CM | POA: Diagnosis present

## 2019-06-11 DIAGNOSIS — E785 Hyperlipidemia, unspecified: Secondary | ICD-10-CM | POA: Diagnosis present

## 2019-06-11 DIAGNOSIS — C678 Malignant neoplasm of overlapping sites of bladder: Secondary | ICD-10-CM | POA: Diagnosis not present

## 2019-06-11 DIAGNOSIS — N179 Acute kidney failure, unspecified: Principal | ICD-10-CM | POA: Diagnosis present

## 2019-06-11 DIAGNOSIS — D649 Anemia, unspecified: Secondary | ICD-10-CM | POA: Diagnosis not present

## 2019-06-11 DIAGNOSIS — Z906 Acquired absence of other parts of urinary tract: Secondary | ICD-10-CM | POA: Diagnosis not present

## 2019-06-11 DIAGNOSIS — Z9221 Personal history of antineoplastic chemotherapy: Secondary | ICD-10-CM | POA: Diagnosis not present

## 2019-06-11 DIAGNOSIS — E875 Hyperkalemia: Secondary | ICD-10-CM | POA: Diagnosis present

## 2019-06-11 DIAGNOSIS — C679 Malignant neoplasm of bladder, unspecified: Secondary | ICD-10-CM | POA: Diagnosis present

## 2019-06-11 DIAGNOSIS — Z936 Other artificial openings of urinary tract status: Secondary | ICD-10-CM

## 2019-06-11 DIAGNOSIS — C775 Secondary and unspecified malignant neoplasm of intrapelvic lymph nodes: Secondary | ICD-10-CM | POA: Diagnosis present

## 2019-06-11 DIAGNOSIS — R601 Generalized edema: Secondary | ICD-10-CM | POA: Diagnosis not present

## 2019-06-11 DIAGNOSIS — Z66 Do not resuscitate: Secondary | ICD-10-CM | POA: Diagnosis present

## 2019-06-11 DIAGNOSIS — C786 Secondary malignant neoplasm of retroperitoneum and peritoneum: Secondary | ICD-10-CM | POA: Diagnosis present

## 2019-06-11 DIAGNOSIS — U071 COVID-19: Secondary | ICD-10-CM | POA: Diagnosis present

## 2019-06-11 DIAGNOSIS — Z7189 Other specified counseling: Secondary | ICD-10-CM | POA: Diagnosis not present

## 2019-06-11 DIAGNOSIS — N1330 Unspecified hydronephrosis: Secondary | ICD-10-CM

## 2019-06-11 LAB — BASIC METABOLIC PANEL
Anion gap: 10 (ref 5–15)
BUN: 62 mg/dL — ABNORMAL HIGH (ref 6–23)
BUN: 64 mg/dL — ABNORMAL HIGH (ref 8–23)
CO2: 21 mmol/L — ABNORMAL LOW (ref 22–32)
CO2: 23 mEq/L (ref 19–32)
Calcium: 8.3 mg/dL — ABNORMAL LOW (ref 8.9–10.3)
Calcium: 8.7 mg/dL (ref 8.4–10.5)
Chloride: 103 mmol/L (ref 98–111)
Chloride: 99 mEq/L (ref 96–112)
Creatinine, Ser: 3.07 mg/dL — ABNORMAL HIGH (ref 0.61–1.24)
Creatinine, Ser: 3.12 mg/dL — ABNORMAL HIGH (ref 0.40–1.50)
GFR calc Af Amer: 23 mL/min — ABNORMAL LOW (ref >60)
GFR calc non Af Amer: 20 mL/min — ABNORMAL LOW (ref >60)
GFR: 19.82 mL/min — ABNORMAL LOW (ref >60.00)
Glucose, Bld: 132 mg/dL — ABNORMAL HIGH (ref 70–99)
Glucose, Bld: 145 mg/dL — ABNORMAL HIGH (ref 70–99)
Potassium: 5.7 mEq/L — ABNORMAL HIGH (ref 3.5–5.1)
Potassium: 5.7 mmol/L — ABNORMAL HIGH (ref 3.5–5.1)
Sodium: 133 mEq/L — ABNORMAL LOW (ref 135–145)
Sodium: 134 mmol/L — ABNORMAL LOW (ref 135–145)

## 2019-06-11 LAB — PROTIME-INR
INR: 3.9 — ABNORMAL HIGH (ref 0.8–1.2)
Prothrombin Time: 38.4 seconds — ABNORMAL HIGH (ref 11.4–15.2)

## 2019-06-11 LAB — LIPASE, BLOOD: Lipase: 20 U/L (ref 11–51)

## 2019-06-11 LAB — COMPREHENSIVE METABOLIC PANEL
ALT: 23 U/L (ref 0–44)
AST: 19 U/L (ref 15–41)
Albumin: 2.9 g/dL — ABNORMAL LOW (ref 3.5–5.0)
Alkaline Phosphatase: 72 U/L (ref 38–126)
Anion gap: 11 (ref 5–15)
BUN: 65 mg/dL — ABNORMAL HIGH (ref 8–23)
CO2: 20 mmol/L — ABNORMAL LOW (ref 22–32)
Calcium: 8.6 mg/dL — ABNORMAL LOW (ref 8.9–10.3)
Chloride: 101 mmol/L (ref 98–111)
Creatinine, Ser: 3.17 mg/dL — ABNORMAL HIGH (ref 0.61–1.24)
GFR calc Af Amer: 22 mL/min — ABNORMAL LOW (ref >60)
GFR calc non Af Amer: 19 mL/min — ABNORMAL LOW (ref >60)
Glucose, Bld: 197 mg/dL — ABNORMAL HIGH (ref 70–99)
Potassium: 5.5 mmol/L — ABNORMAL HIGH (ref 3.5–5.1)
Sodium: 132 mmol/L — ABNORMAL LOW (ref 135–145)
Total Bilirubin: 0.3 mg/dL (ref 0.3–1.2)
Total Protein: 6 g/dL — ABNORMAL LOW (ref 6.5–8.1)

## 2019-06-11 LAB — CBC
HCT: 27.4 % — ABNORMAL LOW (ref 39.0–52.0)
Hemoglobin: 9 g/dL — ABNORMAL LOW (ref 13.0–17.0)
MCHC: 32.9 g/dL (ref 30.0–36.0)
MCV: 90.3 fl (ref 78.0–100.0)
Platelets: 325 10*3/uL (ref 150.0–400.0)
RBC: 3.04 Mil/uL — ABNORMAL LOW (ref 4.22–5.81)
RDW: 15.3 % (ref 11.5–15.5)
WBC: 11.9 10*3/uL — ABNORMAL HIGH (ref 4.0–10.5)

## 2019-06-11 LAB — CBC WITH DIFFERENTIAL/PLATELET
Abs Immature Granulocytes: 0.09 10*3/uL — ABNORMAL HIGH (ref 0.00–0.07)
Basophils Absolute: 0.1 10*3/uL (ref 0.0–0.1)
Basophils Relative: 1 %
Eosinophils Absolute: 0.2 10*3/uL (ref 0.0–0.5)
Eosinophils Relative: 2 %
HCT: 25.2 % — ABNORMAL LOW (ref 39.0–52.0)
Hemoglobin: 8 g/dL — ABNORMAL LOW (ref 13.0–17.0)
Immature Granulocytes: 1 %
Lymphocytes Relative: 7 %
Lymphs Abs: 0.8 10*3/uL (ref 0.7–4.0)
MCH: 29.7 pg (ref 26.0–34.0)
MCHC: 31.7 g/dL (ref 30.0–36.0)
MCV: 93.7 fL (ref 80.0–100.0)
Monocytes Absolute: 0.8 10*3/uL (ref 0.1–1.0)
Monocytes Relative: 7 %
Neutro Abs: 9.4 10*3/uL — ABNORMAL HIGH (ref 1.7–7.7)
Neutrophils Relative %: 82 %
Platelets: 243 10*3/uL (ref 150–400)
RBC: 2.69 MIL/uL — ABNORMAL LOW (ref 4.22–5.81)
RDW: 15.2 % (ref 11.5–15.5)
WBC: 11.4 10*3/uL — ABNORMAL HIGH (ref 4.0–10.5)
nRBC: 0 % (ref 0.0–0.2)

## 2019-06-11 LAB — URINALYSIS, ROUTINE W REFLEX MICROSCOPIC
Bilirubin Urine: NEGATIVE
Glucose, UA: NEGATIVE mg/dL
Ketones, ur: NEGATIVE mg/dL
Nitrite: NEGATIVE
Protein, ur: NEGATIVE mg/dL
Specific Gravity, Urine: 1.01 (ref 1.005–1.030)
pH: 6.5 (ref 5.0–8.0)

## 2019-06-11 LAB — URINALYSIS, MICROSCOPIC (REFLEX)

## 2019-06-11 LAB — POCT INR: INR: 5.9 — AB (ref 2.0–3.0)

## 2019-06-11 MED ORDER — SODIUM CHLORIDE 0.9 % IV BOLUS
250.0000 mL | Freq: Once | INTRAVENOUS | Status: AC
  Administered 2019-06-11: 250 mL via INTRAVENOUS

## 2019-06-11 MED ORDER — SODIUM CHLORIDE 0.9 % IV SOLN
1.0000 g | Freq: Once | INTRAVENOUS | Status: AC
  Administered 2019-06-11: 1 g via INTRAVENOUS
  Filled 2019-06-11: qty 10

## 2019-06-11 MED ORDER — INSULIN ASPART 100 UNIT/ML IV SOLN
5.0000 [IU] | Freq: Once | INTRAVENOUS | Status: AC
  Administered 2019-06-11: 5 [IU] via INTRAVENOUS
  Filled 2019-06-11: qty 1

## 2019-06-11 MED ORDER — ACETAMINOPHEN 500 MG PO TABS
1000.0000 mg | ORAL_TABLET | Freq: Once | ORAL | Status: AC
  Administered 2019-06-11: 1000 mg via ORAL
  Filled 2019-06-11: qty 2

## 2019-06-11 MED ORDER — SODIUM ZIRCONIUM CYCLOSILICATE 10 G PO PACK
10.0000 g | PACK | Freq: Once | ORAL | Status: AC
  Administered 2019-06-11: 19:00:00 10 g via ORAL
  Filled 2019-06-11: qty 1

## 2019-06-11 MED ORDER — DEXTROSE 50 % IV SOLN
1.0000 | Freq: Once | INTRAVENOUS | Status: AC
  Administered 2019-06-11: 50 mL via INTRAVENOUS
  Filled 2019-06-11: qty 50

## 2019-06-11 MED ORDER — SODIUM CHLORIDE 0.9 % IV SOLN
INTRAVENOUS | Status: DC | PRN
  Administered 2019-06-11: 18:00:00 500 mL via INTRAVENOUS

## 2019-06-11 NOTE — ED Notes (Signed)
ED TO INPATIENT HANDOFF REPORT  ED Nurse Name and Phone #: Shelda Pal [phone]  S Name/Age/Gender Sergio Pennington 69 y.o. male Room/Bed: MH04/MH04  Code Status   Code Status: Prior  Home/SNF/Other Home Patient oriented to: self, place, time and situation Is this baseline? Yes   Triage Complete: Triage complete  Chief Complaint AKI (acute kidney injury) [facility]) [N17.9]  Triage Note Pt sent  here from PMD office for abs labs , recent admission to hospital .     Allergies Allergies  Allergen Reactions  . Sulfa Antibiotics Hives    Level of Care/Admitting Diagnosis ED Disposition    ED Disposition Condition Comment   Admit  Hospital Area: [facility] [[id number]]  Level of Care: Telemetry [5]  Admit to tele based on following criteria: Other see comments  Comments: AKI, hyperkalemia  Covid Evaluation: Symptomatic Person Under Investigation (PUI)  Diagnosis: AKI (acute kidney injury) [facility]) [[id number]]  Admitting Physician: Lequita Halt [[id number]]  Attending Physician: Lequita Halt [[id number]]  Estimated length of stay: 3 - 4 days  Certification:: I certify this patient will need inpatient services for at least 2 midnights       B Medical/Surgery History Past Medical History:  Diagnosis Date  . Bladder cancer ([place])   . Cellulitis    Left leg   . Constipation   . Diabetes mellitus without complication ([place])   . Diabetic neuropathy ([place])    Feet  . Hematuria 04/21/2018  . Hyperlipidemia   . 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 Vasculitis ([place])    Left leg   Past Surgical History:  Procedure Laterality Date  . CYSTOSCOPY WITH INJECTION N/A 11/26/2018   Procedure: CYSTOSCOPY WITH INJECTION OF INDOCYANINE GREEN DYE;  Surgeon: Alexis Frock, MD;  Location: [facility];  Service: Urology;  Laterality: N/A;  . IR IMAGING GUIDED PORT INSERTION  06/26/2018  . MOUTH SURGERY  1980  . TONSILLECTOMY     4th  grade  . TRANSURETHRAL RESECTION OF BLADDER TUMOR N/A 06/16/2018   Procedure: TRANSURETHRAL RESECTION OF BLADDER TUMOR (TURBT);  Surgeon: Lucas Mallow, MD;  Location: [facility];  Service: Urology;  Laterality: N/A;     A IV Location/Drains/Wounds Patient Lines/Drains/Airways Status   Active Line/Drains/Airways    Name:   Placement date:   Placement time:   Site:   Days:   Implanted Port 06/26/18 Right Chest   06/26/18    1409    Chest   350   Urostomy Ileal conduit RUQ   11/26/18    1400    RUQ   197   Ureteral Drain/Stent Left ureter 7 Fr.   11/26/18    1333    Left ureter   197   Ureteral Drain/Stent Right ureter 7 Fr.   11/26/18    1400    Right ureter   197   Incision - 3 Ports Abdomen 1: Right;Lateral;Lower 2: Medial;Right;Lower 3: Left;Lateral;Lower   11/26/18    --     197          Intake/Output Last 24 hours  Intake/Output Summary (Last 24 hours) at 06/11/2019 1924 Last data filed at 06/11/2019 1837 Gross per 24 hour  Intake 450.66 ml  Output --  Net 450.66 ml    Labs/Imaging Results for orders placed or performed during the hospital encounter of 06/11/19 (from the past 48 hour(s))  Comprehensive metabolic panel     Status: Abnormal  Collection Time: 06/11/19  2:46 PM  Result Value Ref Range   Sodium 132 (L) 135 - 145 mmol/L   Potassium 5.5 (H) 3.5 - 5.1 mmol/L   Chloride 101 98 - 111 mmol/L   CO2 20 (L) 22 - 32 mmol/L   Glucose, Bld 197 (H) 70 - 99 mg/dL   BUN 65 (H) 8 - 23 mg/dL   Creatinine, Ser 3.17 (H) 0.61 - 1.24 mg/dL   Calcium 8.6 (L) 8.9 - 10.3 mg/dL   Total Protein 6.0 (L) 6.5 - 8.1 g/dL   Albumin 2.9 (L) 3.5 - 5.0 g/dL   AST 19 15 - 41 U/L   ALT 23 0 - 44 U/L   Alkaline Phosphatase 72 38 - 126 U/L   Total Bilirubin 0.3 0.3 - 1.2 mg/dL   GFR calc non Af Amer 19 (L) >60 mL/min   GFR calc Af Amer 22 (L) >60 mL/min   Anion gap 11 5 - 15    Comment: Performed at [facility], [address] [place], [place] [address]  CBC with  Differential     Status: Abnormal   Collection Time: 06/11/19  2:46 PM  Result Value Ref Range   WBC 11.4 (H) 4.0 - 10.5 K/uL   RBC 2.69 (L) 4.22 - 5.81 MIL/uL   Hemoglobin 8.0 (L) 13.0 - 17.0 g/dL   HCT 25.2 (L) 39.0 - 52.0 %   MCV 93.7 80.0 - 100.0 fL   MCH 29.7 26.0 - 34.0 pg   MCHC 31.7 30.0 - 36.0 g/dL   RDW 15.2 11.5 - 15.5 %   Platelets 243 150 - 400 K/uL   nRBC 0.0 0.0 - 0.2 %   Neutrophils Relative % 82 %   Neutro Abs 9.4 (H) 1.7 - 7.7 K/uL   Lymphocytes Relative 7 %   Lymphs Abs 0.8 0.7 - 4.0 K/uL   Monocytes Relative 7 %   Monocytes Absolute 0.8 0.1 - 1.0 K/uL   Eosinophils Relative 2 %   Eosinophils Absolute 0.2 0.0 - 0.5 K/uL   Basophils Relative 1 %   Basophils Absolute 0.1 0.0 - 0.1 K/uL   Immature Granulocytes 1 %   Abs Immature Granulocytes 0.09 (H) 0.00 - 0.07 K/uL    Comment: Performed at [facility], [address] [place], [place] [address]  Lipase, blood     Status: None   Collection Time: 06/11/19  2:46 PM  Result Value Ref Range   Lipase 20 11 - 51 U/L    Comment: Performed at [facility], [address] [place], [place] [address]  Protime-INR     Status: Abnormal   Collection Time: 06/11/19  2:46 PM  Result Value Ref Range   Prothrombin Time 38.4 (H) 11.4 - 15.2 seconds   INR 3.9 (H) 0.8 - 1.2    Comment: (NOTE) INR goal varies based on device and disease states. Performed at [facility], [address] [place], [place] [address]   Urinalysis, Routine w reflex microscopic     Status: Abnormal   Collection Time: 06/11/19  4:00 PM  Result Value Ref Range   Color, Urine YELLOW YELLOW   APPearance CLEAR CLEAR   Specific Gravity, Urine 1.010 1.005 - 1.030   pH 6.5 5.0 - 8.0   Glucose, UA NEGATIVE NEGATIVE mg/dL   Hgb urine dipstick SMALL (A) NEGATIVE   Bilirubin Urine NEGATIVE NEGATIVE   Ketones, ur NEGATIVE NEGATIVE mg/dL  Protein, ur NEGATIVE NEGATIVE mg/dL   Nitrite NEGATIVE NEGATIVE   Leukocytes,Ua  SMALL (A) NEGATIVE    Comment: Performed at [facility], [address] [place], [place] [address]  Urinalysis, Microscopic (reflex)     Status: Abnormal   Collection Time: 06/11/19  4:00 PM  Result Value Ref Range   RBC / HPF 0-5 0 - 5 RBC/hpf   WBC, UA 0-5 0 - 5 WBC/hpf   Bacteria, UA FEW (A) NONE SEEN   Squamous Epithelial / LPF 0-5 0 - 5    Comment: Performed at [facility], [address] [place], [place] [address]   CT ABDOMEN PELVIS WO CONTRAST  Result Date: 06/11/2019 CLINICAL DATA:  Worsening renal function, recent admission for weakness and COVID-19 infection EXAM: CT ABDOMEN AND PELVIS WITHOUT CONTRAST TECHNIQUE: Multidetector CT imaging of the abdomen and pelvis was performed following the standard protocol without IV contrast. COMPARISON:  CT 05/27/2019 FINDINGS: Lower chest: Right pleural effusion, slightly increased in size from comparison exam. Adjacent atelectatic change. Subpleural regions of fibrosis are similar to prior. Reticulonodular opacities in the anterior left lower lobe as well as calcified nodularity in the right lung base are similar to prior. Hepatobiliary: Likely focal fatty infiltration near the false form ligament. Smooth hepatic surface contour. No visible or contour deforming hepatic lesions. Layering density within the gallbladder may reflect biliary sludge. No biliary ductal dilatation or calcified gallstones. No pericholecystic inflammation. Pancreas: Unremarkable. No pancreatic ductal dilatation or surrounding inflammatory changes. Spleen: Normal in size without focal abnormality. Adrenals/Urinary Tract: Normal adrenal gland. Redemonstration of a complex cystic lesion arising from the lower pole right kidney measuring up to 2.8 x 4.1 x 3.7 cm in size with some internal calcified septation noted. Moderate bilateral hydroureteronephrosis is slightly increased from comparison with increasing Peri ureteral stranding and some probable  urothelial thickening to the level of what appears to be an ileal conduit and urostomy in the right lower quadrant. No distension of the urinary conduit system at this time. Patient is post cysto prostatectomy. Irregular soft tissue is noted in the surgical bed including in amorphous region of soft tissue attenuation measuring up to 7.4 x 2.8 cm, previously 6.8 x 2.6 cm. Stomach/Bowel: Distal esophagus, stomach and duodenum are unremarkable. No small bowel dilatation or wall thickening. Small bowel anastomosis present in the left lower quadrant adjacent the ileal conduit. Difficult to fully ascertain the configuration of the bowel in this region due to lack of intravenous contrast or enteric contrast media. Some small amount of fecalization adjacent the conduit could reflect slowed transit in this location but without resultant upstream obstruction. There is a normal appendix in the right lower quadrant. No colonic dilatation or wall thickening. Few scattered distal colonic diverticula are present. No diverticular inflammation is seen to suggest active diverticulitis Vascular/Lymphatic: Atherosclerotic calcification of the aorta and branch vessels. No aneurysm or ectasia. Retroperitoneal and periaortic adenopathy is similar to comparison. Largest nodes include a 16 mm node at the level of the bifurcation (2/51) a 14 mm right aortocaval node 2/36 and a 15 mm left periaortic node (2/42). Extensive pathologically enlarged pelvic adenopathy is also present, largest is a multilobular nodal conglomerate in the left inguinal region measuring to 2.5 by 5.3 cm, more confluent in appearance from the comparison study and measuring only 4.2 x 2.7 cm at that time. Reproductive: Post cysto prostatectomy, as detailed above. Irregular soft tissue attenuation in the operative site of the pelvis, as detailed  above. Other: Increasing circumferential body wall edema is noted. Fat containing bilateral inguinal hernias are again seen but  there are postsurgical changes of the ventral midline abdomen are again noted. Overall increased volume of ascites, now moderate. Musculoskeletal: Persistent irregular sacral lucency at S3. No new suspicious osseous lesions are clearly evident. Multilevel degenerative changes are present in the imaged portions of the spine. IMPRESSION: 1. Slight interval increase in the moderate bilateral hydroureteronephrosis with increasing periureteral stranding and some probable urothelial thickening to the level of what appears to be an ileal conduit and urostomy in the right lower quadrant. Findings could reflect the result of infection or obstruction. Correlate with output and clinical symptoms. 2. Other postsurgical changes from cysto prostatectomy with increasing pelvic soft tissue nodularity and extensive retroperitoneal and pelvic adenopathy compatible with worsening metastatic disease. 3. Similar appearance of a complex cystic lesion arising from the lower pole of the right kidney. Previously characterized as Bosniak IIF. 4. Irregular lucency at S3, nonspecific. Could reflect skeletal metastasis however. 5. Worsening features of anasarca including pleural effusion, moderate ascites, circumferential body wall edema. 6. Aortic Atherosclerosis (ICD10-I70.0). Electronically Signed   By: Lovena Le M.D.   On: 06/11/2019 17:23    Pending Labs Unresulted Labs (From admission, onward)    Start     Ordered   06/11/19 1749  Urine culture  ONCE - STAT,   STAT     06/11/19 1748          Vitals/Pain Today's Vitals   06/11/19 1800 06/11/19 1830 06/11/19 1845 06/11/19 1900  BP: (!) 158/73 (!) 149/65  (!) 153/66  Pulse: 79 76 76 74  Resp: 18     Temp:      TempSrc:      SpO2: 99% 97% 97% 97%  Weight:      Height:      PainSc: 4        Isolation Precautions Airborne and Contact precautions  Medications Medications  0.9 %  sodium chloride infusion (500 mLs Intravenous New Bag/Given 06/11/19 1804)  insulin  aspart (novoLOG) injection 5 Units (5 Units Intravenous Given 06/11/19 1554)    And  dextrose 50 % solution 50 mL (50 mLs Intravenous Given 06/11/19 1550)  sodium chloride 0.9 % bolus 250 mL (0 mLs Intravenous Stopped 06/11/19 1756)  cefTRIAXone (ROCEPHIN) 1 g in sodium chloride 0.9 % 100 mL IVPB (0 g Intravenous Stopped 06/11/19 1837)  sodium zirconium cyclosilicate (LOKELMA) packet 10 g (10 g Oral Given 06/11/19 1905)    Mobility walks Low fall risk     R Recommendations: See Admitting Provider Note  Report given to:   Additional Notes: Pt has urostomy, pt also has decreased appetite and typically drinks ensures- top and bottom dentures- R port accessed

## 2019-06-11 NOTE — ED Notes (Signed)
Pt wife updated with plan of care- will update her with bed assignment and transfer. Pt resting comfortably on stretcher at this time. No distress noted. No complaints.

## 2019-06-11 NOTE — ED Notes (Signed)
Pt informed of delay in care- pt resting on stretcher.

## 2019-06-11 NOTE — ED Provider Notes (Signed)
[facility] EMERGENCY DEPARTMENT Provider Note   CSN: [id number] Arrival date & time: 06/11/19  1424     History Chief Complaint  Patient presents with  . Weakness    Sergio Pennington is a 69 y.o. male with PMH/o bladder cancer, DM, HLD, COVID who presents for evaluation of abnormal labs.  Patient was seen by his PCP today for evaluation of hospital follow-up.  He had blood work done that resulted today was called to go to the emergency department.  Patient was admitted on 05/27/2019 for evaluation of fever and generalized weakness.  He was found to be Covid positive and was admitted to the hospital.  He was discharged on 06/04/2018.  Patient reports that since discharge, he has had generalized weakness, generalized achiness.  He states he has no energy and gets fatigued very easily.  He was also found of bilateral DVTs during admission status started on Coumadin which he states he has been compliant with.  Patient reports he has had some intermittent abdominal pain, mostly in the lower abdomen.  He also has a urostomy bag and states that over the last couple days, is noticed that the urine has been darker than normal.  He states he has not had any chest pain, difficulty breathing, fevers, nausea/vomiting.                                                                                The history is provided by the patient.       Past Medical History:  Diagnosis Date  . Bladder cancer ([place])   . Cellulitis    Left leg   . Constipation   . Diabetes mellitus without complication ([place])   . Diabetic neuropathy ([place])    Feet  . Hematuria 04/21/2018  . Hyperlipidemia   . 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 Vasculitis ([place])    Left leg    Patient Active Problem List   Diagnosis Date Noted  . AKI (acute kidney injury) ([place]) 06/11/2019  . CKD (chronic kidney disease), stage III 06/05/2019  . COVID-19 virus infection 05/27/2019    . Anemia 05/27/2019  . Port-A-Cath in place 07/03/2018  . Bladder cancer ([place]) 06/17/2018  . DIAB W/O MENTION COMP TYPE II/UNS TYPE UNCNTRL 03/08/2008  . DIABETES MELLITUS, TYPE II, UNCONTROLLED, W/NEUROLO COMPS 03/08/2008  . HYPERLIPIDEMIA 03/08/2008  . TOBACCO ABUSE 03/08/2008    Past Surgical History:  Procedure Laterality Date  . CYSTOSCOPY WITH INJECTION N/A 11/26/2018   Procedure: CYSTOSCOPY WITH INJECTION OF INDOCYANINE GREEN DYE;  Surgeon: Alexis Frock, MD;  Location: [facility];  Service: Urology;  Laterality: N/A;  . IR IMAGING GUIDED PORT INSERTION  06/26/2018  . MOUTH SURGERY  1980  . TONSILLECTOMY     4th grade  . TRANSURETHRAL RESECTION OF BLADDER TUMOR N/A 06/16/2018   Procedure: TRANSURETHRAL RESECTION OF BLADDER TUMOR (TURBT);  Surgeon: Lucas Mallow, MD;  Location: [facility];  Service: Urology;  Laterality: N/A;       History reviewed. No pertinent family history.  Social History   Tobacco Use  . Smoking status: Former Research scientist (life sciences)  . Smokeless tobacco:  Never Used  Substance Use Topics  . Alcohol use: Not Currently    Alcohol/week: 1.0 standard drinks    Types: 1 Glasses of wine per week  . Drug use: Not Currently    Home Medications Prior to Admission medications   Medication Sig Start Date End Date Taking? Authorizing Provider  acetaminophen (TYLENOL) 500 MG tablet Take 1,000 mg by mouth every 6 (six) hours.     [provider]  ascorbic acid (VITAMIN C) 500 MG tablet Take 1 tablet (500 mg total) by mouth 2 (two) times daily for 14 days. 06/05/19 06/19/19  Arma Heading, MD  Cholecalciferol (VITAMIN D3) 125 MCG (5000 UT) CAPS Take 5,000 Units by mouth daily.    [provider]  Cyanocobalamin (VITAMIN B-12 PO) Take 4,000 mcg by mouth daily.     [provider]  magnesium oxide (MAG-OX) 400 MG tablet TAKE 1 TABLET BY MOUTH EVERY DAY 01/06/19   Wyatt Portela, MD  ondansetron (ZOFRAN ODT) 4 MG disintegrating tablet 4mg  ODT q4 hours prn  nausea/vomit 05/12/19   Deno Etienne, DO  patiromer (VELTASSA) 8.4 g packet Take 1 packet (8.4 g total) by mouth daily for 15 days. Patient not taking: Reported on 06/11/2019 06/06/19 06/21/19  Arma Heading, MD  polyethylene glycol powder [facility]) 17 GM/SCOOP powder Take 1 Container by mouth daily.     [provider]  Probiotic Product (PROBIOTIC DAILY PO) Take 1 capsule by mouth daily.    [provider]  prochlorperazine (COMPAZINE) 10 MG tablet TAKE 1 TABLET BY MOUTH EVERY 6 HOURS AS NEEDED FOR NAUSEA OR VOMITING. Patient taking differently: Take 10 mg by mouth every 6 (six) hours as needed for nausea or vomiting.  11/04/18   Wyatt Portela, MD  senna-docusate (SENNA S) 8.6-50 MG tablet Take 1 tablet by mouth 2 (two) times daily. 10/08/18   Wyatt Portela, MD  UNABLE TO FIND 1,000 mg 3 (three) times daily. Med Name:[facility]    [provider]  warfarin (COUMADIN) 5 MG tablet Take 1 tablet (5 mg total) by mouth daily. 06/05/19 06/04/20  Arma Heading, MD  Zinc 30 MG TABS Take 1 tablet by mouth daily.     [provider]    Allergies    Sulfa antibiotics  Review of Systems   Review of Systems  Constitutional: Positive for fatigue.  Respiratory: Negative for shortness of breath.   Cardiovascular: Negative for chest pain.  Gastrointestinal: Negative for abdominal pain, nausea and vomiting.  Genitourinary: Negative for dysuria and hematuria.  Neurological: Positive for weakness (generalized). Negative for headaches.  All other systems reviewed and are negative.   Physical Exam Updated Vital Signs BP (!) 158/71   Pulse 79   Temp 98.8 F (37.1 C)   Resp 18   Ht 6' (1.829 m)   Wt 81 kg   SpO2 96%   BMI 24.22 kg/m   Physical Exam Vitals and nursing note reviewed.  Constitutional:      Appearance: Normal appearance. He is well-developed.  HENT:     Head: Normocephalic and atraumatic.  Eyes:     General: Lids are normal.     Conjunctiva/sclera:  Conjunctivae normal.     Pupils: Pupils are equal, round, and reactive to light.  Cardiovascular:     Rate and Rhythm: Normal rate and regular rhythm.     Pulses: Normal pulses.     Heart sounds: Normal heart sounds. No murmur. No friction rub. No gallop.  Pulmonary:     Effort: Pulmonary effort is normal.     Breath sounds: Normal breath sounds.     Comments: Lungs clear to auscultation bilaterally.  Symmetric chest rise.  No wheezing, rales, rhonchi. Chest:     Comments: Port noted in the right anterior chest.  No surrounding warmth, erythema. Abdominal:     Palpations: Abdomen is soft. Abdomen is not rigid.     Tenderness: There is no abdominal tenderness. There is no guarding.     Comments: Abdomen is soft, nondistended.  Urostomy bag noted to the right lower abdomen.  Stoma appears to be intact with no surrounding warmth, erythema.  Draining yellowish urine.  Musculoskeletal:        General: Normal range of motion.     Cervical back: Full passive range of motion without pain.  Skin:    General: Skin is warm and dry.     Capillary Refill: Capillary refill takes less than 2 seconds.  Neurological:     Mental Status: He is alert and oriented to person, place, and time.  Psychiatric:        Speech: Speech normal.     ED Results / Procedures / Treatments   Labs (all labs ordered are listed, but only abnormal results are displayed) Labs Reviewed  COMPREHENSIVE METABOLIC PANEL - Abnormal; Notable for the following components:      Result Value   Sodium 132 (*)    Potassium 5.5 (*)    CO2 20 (*)    Glucose, Bld 197 (*)    BUN 65 (*)    Creatinine, Ser 3.17 (*)    Calcium 8.6 (*)    Total Protein 6.0 (*)    Albumin 2.9 (*)    GFR calc non Af Amer 19 (*)    GFR calc Af Amer 22 (*)    All other components within normal limits  CBC WITH DIFFERENTIAL/PLATELET - Abnormal; Notable for the following components:   WBC 11.4 (*)    RBC 2.69 (*)    Hemoglobin 8.0 (*)    HCT 25.2  (*)    Neutro Abs 9.4 (*)    Abs Immature Granulocytes 0.09 (*)    All other components within normal limits  URINALYSIS, ROUTINE W REFLEX MICROSCOPIC - Abnormal; Notable for the following components:   Hgb urine dipstick SMALL (*)    Leukocytes,Ua SMALL (*)    All other components within normal limits  PROTIME-INR - Abnormal; Notable for the following components:   Prothrombin Time 38.4 (*)    INR 3.9 (*)    All other components within normal limits  URINALYSIS, MICROSCOPIC (REFLEX) - Abnormal; Notable for the following components:   Bacteria, UA FEW (*)    All other components within normal limits  URINE CULTURE  LIPASE, BLOOD    EKG EKG Interpretation  Date/Time:  Thursday June 11 2019 15:57:41 EST Ventricular Rate:  80 PR Interval:    QRS Duration: 109 QT Interval:  395 QTC Calculation: 456 R Axis:   -25 Text Interpretation: Sinus rhythm Borderline left axis deviation RSR' in V1 or V2, right VCD or RVH Baseline wander in lead(s) V1 V4 No STEMI Confirmed by Nanda Quinton [phone]) on 06/11/2019 4:00:24 PM   Radiology CT ABDOMEN PELVIS WO CONTRAST  Result Date: 06/11/2019 CLINICAL DATA:  Worsening renal function, recent admission for weakness and COVID-19 infection EXAM: CT ABDOMEN AND PELVIS WITHOUT CONTRAST TECHNIQUE: Multidetector CT imaging of the abdomen and pelvis was performed following the standard  protocol without IV contrast. COMPARISON:  CT 05/27/2019 FINDINGS: Lower chest: Right pleural effusion, slightly increased in size from comparison exam. Adjacent atelectatic change. Subpleural regions of fibrosis are similar to prior. Reticulonodular opacities in the anterior left lower lobe as well as calcified nodularity in the right lung base are similar to prior. Hepatobiliary: Likely focal fatty infiltration near the false form ligament. Smooth hepatic surface contour. No visible or contour deforming hepatic lesions. Layering density within the gallbladder may reflect  biliary sludge. No biliary ductal dilatation or calcified gallstones. No pericholecystic inflammation. Pancreas: Unremarkable. No pancreatic ductal dilatation or surrounding inflammatory changes. Spleen: Normal in size without focal abnormality. Adrenals/Urinary Tract: Normal adrenal gland. Redemonstration of a complex cystic lesion arising from the lower pole right kidney measuring up to 2.8 x 4.1 x 3.7 cm in size with some internal calcified septation noted. Moderate bilateral hydroureteronephrosis is slightly increased from comparison with increasing Peri ureteral stranding and some probable urothelial thickening to the level of what appears to be an ileal conduit and urostomy in the right lower quadrant. No distension of the urinary conduit system at this time. Patient is post cysto prostatectomy. Irregular soft tissue is noted in the surgical bed including in amorphous region of soft tissue attenuation measuring up to 7.4 x 2.8 cm, previously 6.8 x 2.6 cm. Stomach/Bowel: Distal esophagus, stomach and duodenum are unremarkable. No small bowel dilatation or wall thickening. Small bowel anastomosis present in the left lower quadrant adjacent the ileal conduit. Difficult to fully ascertain the configuration of the bowel in this region due to lack of intravenous contrast or enteric contrast media. Some small amount of fecalization adjacent the conduit could reflect slowed transit in this location but without resultant upstream obstruction. There is a normal appendix in the right lower quadrant. No colonic dilatation or wall thickening. Few scattered distal colonic diverticula are present. No diverticular inflammation is seen to suggest active diverticulitis Vascular/Lymphatic: Atherosclerotic calcification of the aorta and branch vessels. No aneurysm or ectasia. Retroperitoneal and periaortic adenopathy is similar to comparison. Largest nodes include a 16 mm node at the level of the bifurcation (2/51) a 14 mm right  aortocaval node 2/36 and a 15 mm left periaortic node (2/42). Extensive pathologically enlarged pelvic adenopathy is also present, largest is a multilobular nodal conglomerate in the left inguinal region measuring to 2.5 by 5.3 cm, more confluent in appearance from the comparison study and measuring only 4.2 x 2.7 cm at that time. Reproductive: Post cysto prostatectomy, as detailed above. Irregular soft tissue attenuation in the operative site of the pelvis, as detailed above. Other: Increasing circumferential body wall edema is noted. Fat containing bilateral inguinal hernias are again seen but there are postsurgical changes of the ventral midline abdomen are again noted. Overall increased volume of ascites, now moderate. Musculoskeletal: Persistent irregular sacral lucency at S3. No new suspicious osseous lesions are clearly evident. Multilevel degenerative changes are present in the imaged portions of the spine. IMPRESSION: 1. Slight interval increase in the moderate bilateral hydroureteronephrosis with increasing periureteral stranding and some probable urothelial thickening to the level of what appears to be an ileal conduit and urostomy in the right lower quadrant. Findings could reflect the result of infection or obstruction. Correlate with output and clinical symptoms. 2. Other postsurgical changes from cysto prostatectomy with increasing pelvic soft tissue nodularity and extensive retroperitoneal and pelvic adenopathy compatible with worsening metastatic disease. 3. Similar appearance of a complex cystic lesion arising from the lower pole of the right kidney.  Previously characterized as Bosniak IIF. 4. Irregular lucency at S3, nonspecific. Could reflect skeletal metastasis however. 5. Worsening features of anasarca including pleural effusion, moderate ascites, circumferential body wall edema. 6. Aortic Atherosclerosis (ICD10-I70.0). Electronically Signed   By: Lovena Le M.D.   On: 06/11/2019 17:23     Procedures Procedures (including critical care time)  Medications Ordered in ED Medications  0.9 %  sodium chloride infusion (500 mLs Intravenous New Bag/Given 06/11/19 1804)  insulin aspart (novoLOG) injection 5 Units (5 Units Intravenous Given 06/11/19 1554)    And  dextrose 50 % solution 50 mL (50 mLs Intravenous Given 06/11/19 1550)  sodium chloride 0.9 % bolus 250 mL (0 mLs Intravenous Stopped 06/11/19 1756)  cefTRIAXone (ROCEPHIN) 1 g in sodium chloride 0.9 % 100 mL IVPB (0 g Intravenous Stopped 06/11/19 1837)  sodium zirconium cyclosilicate (LOKELMA) packet 10 g (10 g Oral Given 06/11/19 1905)  acetaminophen (TYLENOL) tablet 1,000 mg (1,000 mg Oral Given 06/11/19 2038)    ED Course  I have reviewed the triage vital signs and the nursing notes.  Pertinent labs & imaging results that were available during my care of the patient were reviewed by me and considered in my medical decision making (see chart for details).    MDM Rules/Calculators/A&P                      69 year old male with past medical story bladder cancer, COVID-19 who presents for evaluation of abnormal labs.  Was recently admitted to the hospital after finding out he had Covid from 05/05/2019-06/04/18.  He reports that since discharge, he has been tired and fatigued.  He went to his primary care doctor for follow-up evaluation today.  He had abnormal work-up, including worsening kidney function, hyperkalemia and his INR was supratherapeutic.  PCP had directed him to come to the emergency department for further evaluation.  Per review of record, she had talked to Dr. Joelyn Oms (nephrology) who agreed with plan for ED evaluation.     CMP shows potassium of 5.5, BUN of 65, creatinine of 3.17.    Looks like 6 days ago during his admission, his creatinine was 2.38.  INR is 3.9.  Lipase is unremarkable.  CBC shows slight leukocytosis 11.4.  Hemoglobin is 8.0.  This is a drop from earlier today.  He was at 9.0.  During his  admission, he fluctuated between 7.9-8.3.  CT scans show slight interval increase in the moderate bilateral hydroureter nephrosis with increasing periureteral stranding and urothelial thickening.  Could represent infection versus obstruction.  He also has some postsurgical changes from cystoprostatectomy.  Discussed patient with Dr. Junious Silk (Urology).  He agrees for plan for medical admission.  He will plan to consult.  Patient will most likely need a loopogram.  He recommends obtaining urine culture and starting patient on a dose of Rocephin.  Discussed patient with Dr. Sherrye Payor (hospitalist). He agrees for admission.   Dariusz Brase [place] was evaluated in Emergency Department on 06/11/2019 for the symptoms described in the history of present illness. He was evaluated in the context of the global COVID-19 pandemic, which necessitated consideration that the patient might be at risk for infection with the SARS-CoV-2 virus that causes COVID-19. Institutional protocols and algorithms that pertain to the evaluation of patients at risk for COVID-19 are in a state of rapid change based on information released by regulatory bodies including the CDC and federal and state organizations. These policies and algorithms were followed during the patient's  care in the ED.                                    Marland Kitchen Portions of this note were generated with [employer]. Dictation errors may occur despite best attempts at proofreading.  Final Clinical Impression(s) / ED Diagnoses Final diagnoses:  AKI (acute kidney injury) ([place])  Hyperkalemia    Rx / DC Orders ED Discharge Orders    None       Desma Mcgregor 06/11/19 2126    Margette Fast, MD 06/14/19 1949

## 2019-06-11 NOTE — ED Triage Notes (Signed)
Pt sent  here from PMD office for abs labs , recent admission to hospital .

## 2019-06-11 NOTE — ED Notes (Signed)
Pt port accessed using sterile technique. Blood return noted. Pt tolerated well.  

## 2019-06-11 NOTE — Progress Notes (Signed)
69 y.o.  recent Covid infection status post treatment, bladder cancer status post urostomy n ileostomy (Dr. Tresa Moore, in August 2020), diabetes hypertension, presented to PCP for follow-up visit today.  He was sent to ED for evaluation of worsening kidney function and abdominal pain, CT showed worsening of bilateral hydroureter, blood work showed AKI on CKD and mild hyperkalemia, urology was contacted,, and recommend medically admitted to treat the AKI and possibly UTI, well as hyperkalemia.  Urology plans for a luminal-scope in AM.  Please contact urology once patient arrives.

## 2019-06-11 NOTE — ED Notes (Signed)
ED Provider at bedside. 

## 2019-06-12 ENCOUNTER — Encounter: Payer: Self-pay | Admitting: Family Medicine

## 2019-06-12 DIAGNOSIS — N390 Urinary tract infection, site not specified: Secondary | ICD-10-CM

## 2019-06-12 DIAGNOSIS — N179 Acute kidney failure, unspecified: Principal | ICD-10-CM

## 2019-06-12 DIAGNOSIS — D509 Iron deficiency anemia, unspecified: Secondary | ICD-10-CM

## 2019-06-12 DIAGNOSIS — R601 Generalized edema: Secondary | ICD-10-CM

## 2019-06-12 DIAGNOSIS — D649 Anemia, unspecified: Secondary | ICD-10-CM

## 2019-06-12 DIAGNOSIS — N184 Chronic kidney disease, stage 4 (severe): Secondary | ICD-10-CM

## 2019-06-12 DIAGNOSIS — E875 Hyperkalemia: Secondary | ICD-10-CM

## 2019-06-12 LAB — BASIC METABOLIC PANEL
Anion gap: 12 (ref 5–15)
BUN: 61 mg/dL — ABNORMAL HIGH (ref 8–23)
CO2: 22 mmol/L (ref 22–32)
Calcium: 8.4 mg/dL — ABNORMAL LOW (ref 8.9–10.3)
Chloride: 103 mmol/L (ref 98–111)
Creatinine, Ser: 3.15 mg/dL — ABNORMAL HIGH (ref 0.61–1.24)
GFR calc Af Amer: 22 mL/min — ABNORMAL LOW (ref >60)
GFR calc non Af Amer: 19 mL/min — ABNORMAL LOW (ref >60)
Glucose, Bld: 124 mg/dL — ABNORMAL HIGH (ref 70–99)
Potassium: 5.6 mmol/L — ABNORMAL HIGH (ref 3.5–5.1)
Sodium: 137 mmol/L (ref 135–145)

## 2019-06-12 LAB — CBC
HCT: 24 % — ABNORMAL LOW (ref 39.0–52.0)
Hemoglobin: 7.5 g/dL — ABNORMAL LOW (ref 13.0–17.0)
MCH: 29.4 pg (ref 26.0–34.0)
MCHC: 31.3 g/dL (ref 30.0–36.0)
MCV: 94.1 fL (ref 80.0–100.0)
Platelets: 245 10*3/uL (ref 150–400)
RBC: 2.55 MIL/uL — ABNORMAL LOW (ref 4.22–5.81)
RDW: 15.2 % (ref 11.5–15.5)
WBC: 10 10*3/uL (ref 4.0–10.5)
nRBC: 0 % (ref 0.0–0.2)

## 2019-06-12 LAB — PREPARE RBC (CROSSMATCH)

## 2019-06-12 LAB — SARS CORONAVIRUS 2 (TAT 6-24 HRS): SARS Coronavirus 2: NEGATIVE

## 2019-06-12 MED ORDER — SODIUM CHLORIDE 0.9 % IV SOLN
1.0000 g | INTRAVENOUS | Status: DC
  Administered 2019-06-12 – 2019-06-13 (×2): 1 g via INTRAVENOUS
  Filled 2019-06-12: qty 1
  Filled 2019-06-12: qty 10
  Filled 2019-06-12: qty 1

## 2019-06-12 MED ORDER — SODIUM CHLORIDE 0.9% FLUSH
10.0000 mL | INTRAVENOUS | Status: DC | PRN
  Administered 2019-06-17: 10 mL

## 2019-06-12 MED ORDER — ALBUMIN HUMAN 25 % IV SOLN
12.5000 g | Freq: Four times a day (QID) | INTRAVENOUS | Status: AC
  Administered 2019-06-12 – 2019-06-13 (×3): 12.5 g via INTRAVENOUS
  Filled 2019-06-12 (×4): qty 50

## 2019-06-12 MED ORDER — SODIUM CHLORIDE 0.9% IV SOLUTION: Freq: Once | INTRAVENOUS | Status: DC

## 2019-06-12 MED ORDER — SODIUM POLYSTYRENE SULFONATE 15 GM/60ML PO SUSP
30.0000 g | Freq: Once | ORAL | Status: AC
  Administered 2019-06-12: 30 g via ORAL
  Filled 2019-06-12: qty 120

## 2019-06-12 MED ORDER — SODIUM CHLORIDE 0.9% FLUSH
10.0000 mL | Freq: Two times a day (BID) | INTRAVENOUS | Status: DC
  Administered 2019-06-12 – 2019-06-16 (×7): 10 mL

## 2019-06-12 MED ORDER — CHLORHEXIDINE GLUCONATE CLOTH 2 % EX PADS
6.0000 | MEDICATED_PAD | Freq: Every day | CUTANEOUS | Status: DC
  Administered 2019-06-12 – 2019-06-16 (×5): 6 via TOPICAL

## 2019-06-12 MED ORDER — SODIUM CHLORIDE 0.9 % IV SOLN: INTRAVENOUS | Status: DC

## 2019-06-12 MED ORDER — ONDANSETRON HCL 4 MG/2ML IJ SOLN
4.0000 mg | Freq: Four times a day (QID) | INTRAMUSCULAR | Status: DC | PRN
  Administered 2019-06-14: 4 mg via INTRAVENOUS
  Filled 2019-06-12: qty 2

## 2019-06-12 MED ORDER — ONDANSETRON HCL 4 MG PO TABS: 4.0000 mg | ORAL_TABLET | Freq: Four times a day (QID) | ORAL | Status: DC | PRN

## 2019-06-12 NOTE — H&P (Signed)
History and Physical    [name] HAL:[id number] DOB: 1950/12/13 DOA: 06/11/2019  PCP: Darreld Mclean, MD  Patient coming from:  home  Chief Complaint: abd pain  HPI: Sergio Pennington is a 69 y.o. male with medical history significant of bladder cancer s/p urostomy/ileostomy (dr Tresa Moore aug 2020), dm, htn comes in from urology office for worsening aki and abd pain.  Pt reports weakness, fatigue, for over a week.  Recent hospitalized for covid pna dx on 12/30 and d/c on 1/8 found to also have dvt then on coumadin now.  Denies any sob, cough or fevers at home.  Imaging today showed worsening bilateral hydorureter and cr bump up to 3 from 2.3.  Referred for admission for urology work up and luminal scope in the am.  Review of Systems: As per HPI otherwise 10 point review of systems negative.   Past Medical History:  Diagnosis Date  . Bladder cancer ([place])   . Cellulitis    Left leg   . Constipation   . Diabetes mellitus without complication ([place])   . Diabetic neuropathy ([place])    Feet  . Hematuria 04/21/2018  . Hyperlipidemia   . 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 Vasculitis ([place])    Left leg    Past Surgical History:  Procedure Laterality Date  . CYSTOSCOPY WITH INJECTION N/A 11/26/2018   Procedure: CYSTOSCOPY WITH INJECTION OF INDOCYANINE GREEN DYE;  Surgeon: Alexis Frock, MD;  Location: [facility];  Service: Urology;  Laterality: N/A;  . IR IMAGING GUIDED PORT INSERTION  06/26/2018  . MOUTH SURGERY  1980  . TONSILLECTOMY     4th grade  . TRANSURETHRAL RESECTION OF BLADDER TUMOR N/A 06/16/2018   Procedure: TRANSURETHRAL RESECTION OF BLADDER TUMOR (TURBT);  Surgeon: Lucas Mallow, MD;  Location: [facility];  Service: Urology;  Laterality: N/A;     reports that he has quit smoking. He has never used smokeless tobacco. He reports previous alcohol use of about 1.0 standard drinks of alcohol per week. He reports  previous drug use.  Allergies  Allergen Reactions  . Sulfa Antibiotics Hives    History reviewed. No pertinent family history. no premature CAD  Prior to Admission medications   Medication Sig Start Date End Date Taking? Authorizing Provider  acetaminophen (TYLENOL) 500 MG tablet Take 1,000 mg by mouth every 6 (six) hours.    Yes [provider]  ascorbic acid (VITAMIN C) 500 MG tablet Take 1 tablet (500 mg total) by mouth 2 (two) times daily for 14 days. 06/05/19 06/19/19 Yes Arma Heading, MD  Cholecalciferol (VITAMIN D3) 125 MCG (5000 UT) CAPS Take 5,000 Units by mouth daily.   Yes [provider]  Cyanocobalamin (VITAMIN B-12 PO) Take 4,000 mcg by mouth daily.    Yes [provider]  magnesium oxide (MAG-OX) 400 MG tablet TAKE 1 TABLET BY MOUTH EVERY DAY Patient taking differently: Take 400 mg by mouth daily.  01/06/19  Yes Wyatt Portela, MD  ondansetron (ZOFRAN ODT) 4 MG disintegrating tablet 4mg  ODT q4 hours prn nausea/vomit Patient taking differently: Take 4 mg by mouth every 4 (four) hours as needed for nausea or vomiting.  05/12/19  Yes Deno Etienne, DO  patiromer (VELTASSA) 8.4 g packet Take 1 packet (8.4 g total) by mouth daily for 15 days. 06/06/19 06/21/19 Yes Arma Heading, MD  polyethylene glycol powder [facility]) 17 GM/SCOOP powder Take 1 Container by mouth  daily.    Yes [provider]  Probiotic Product (PROBIOTIC DAILY PO) Take 1 capsule by mouth daily.   Yes [provider]  prochlorperazine (COMPAZINE) 10 MG tablet TAKE 1 TABLET BY MOUTH EVERY 6 HOURS AS NEEDED FOR NAUSEA OR VOMITING. Patient taking differently: Take 10 mg by mouth every 6 (six) hours as needed for nausea or vomiting.  11/04/18  Yes Wyatt Portela, MD  senna-docusate (SENNA S) 8.6-50 MG tablet Take 1 tablet by mouth 2 (two) times daily. Patient taking differently: Take 1 tablet by mouth daily as needed for mild constipation.  10/08/18  Yes Wyatt Portela, MD  Zinc  30 MG TABS Take 1 tablet by mouth daily.    Yes [provider]  warfarin (COUMADIN) 5 MG tablet Take 1 tablet (5 mg total) by mouth daily. 06/05/19 06/04/20  Arma Heading, MD    Physical Exam: Vitals:   06/11/19 2330 06/12/19 0000 06/12/19 0105 06/12/19 0122  BP: 138/68 (!) 158/75 (!) 182/80 (!) 156/68  Pulse: 73 71 74 70  Resp: 18  18   Temp:   97.8 F (36.6 C)   TempSrc:   Oral   SpO2: 96% 96% 99%   Weight:      Height:          Constitutional: NAD, calm, comfortable Vitals:   06/11/19 2330 06/12/19 0000 06/12/19 0105 06/12/19 0122  BP: 138/68 (!) 158/75 (!) 182/80 (!) 156/68  Pulse: 73 71 74 70  Resp: 18  18   Temp:   97.8 F (36.6 C)   TempSrc:   Oral   SpO2: 96% 96% 99%   Weight:      Height:       Eyes: PERRL, lids and conjunctivae normal ENMT: Mucous membranes are moist. Posterior pharynx clear of any exudate or lesions.Normal dentition.  Neck: normal, supple, no masses, no thyromegaly Respiratory: clear to auscultation bilaterally, no wheezing, no crackles. Normal respiratory effort. No accessory muscle use.  Cardiovascular: Regular rate and rhythm, no murmurs / rubs / gallops. No extremity edema. 2+ pedal pulses. No carotid bruits.  Abdomen: no tenderness, no masses palpated. No hepatosplenomegaly. Bowel sounds positive.  Musculoskeletal: no clubbing / cyanosis. No joint deformity upper and lower extremities. Good ROM, no contractures. Normal muscle tone.  Skin: no rashes, lesions, ulcers. No induration Neurologic: CN 2-12 grossly intact. Sensation intact, DTR normal. Strength 5/5 in all 4.  Psychiatric: Normal judgment and insight. Alert and oriented x 3. Normal mood.    Labs on Admission: I have personally reviewed following labs and imaging studies  CBC: Recent Labs  Lab 06/05/19 0319 06/11/19 1038 06/11/19 1446  WBC 18.3* 11.9* 11.4*  NEUTROABS  --   --  9.4*  HGB 7.9* 9.0* 8.0*  HCT 24.9* 27.4* 25.2*  MCV 95.4 90.3 93.7  PLT 341 325.0  540   Basic Metabolic Panel: Recent Labs  Lab 06/05/19 0319 06/11/19 1038 06/11/19 1446 06/11/19 2334  NA 133* 133* 132* 134*  K 5.4* 5.7 No hemolysis seen* 5.5* 5.7*  CL 105 99 101 103  CO2 19* 23 20* 21*  GLUCOSE 134* 132* 197* 145*  BUN 47* 62* 65* 64*  CREATININE 2.38* 3.12* 3.17* 3.07*  CALCIUM 9.1 8.7 8.6* 8.3*   GFR: Estimated Creatinine Clearance: 24.6 mL/min (A) (by C-G formula based on SCr of 3.07 mg/dL (H)). Liver Function Tests: Recent Labs  Lab 06/11/19 1446  AST 19  ALT 23  ALKPHOS 72  BILITOT 0.3  PROT 6.0*  ALBUMIN 2.9*   Recent Labs  Lab 06/11/19 1446  LIPASE 20   No results for input(s): AMMONIA in the last 168 hours. Coagulation Profile: Recent Labs  Lab 06/05/19 0319 06/11/19 1000 06/11/19 1446  INR 2.1* 5.9* 3.9*   Cardiac Enzymes: No results for input(s): CKTOTAL, CKMB, CKMBINDEX, TROPONINI in the last 168 hours. BNP (last 3 results) No results for input(s): PROBNP in the last 8760 hours. HbA1C: No results for input(s): HGBA1C in the last 72 hours. CBG: Recent Labs  Lab 06/05/19 0753 06/05/19 1213  GLUCAP 140* 180*   Lipid Profile: No results for input(s): CHOL, HDL, LDLCALC, TRIG, CHOLHDL, LDLDIRECT in the last 72 hours. Thyroid Function Tests: No results for input(s): TSH, T4TOTAL, FREET4, T3FREE, THYROIDAB in the last 72 hours. Anemia Panel: No results for input(s): VITAMINB12, FOLATE, FERRITIN, TIBC, IRON, RETICCTPCT in the last 72 hours. Urine analysis:    Component Value Date/Time   COLORURINE YELLOW 06/11/2019 1600   APPEARANCEUR CLEAR 06/11/2019 1600   LABSPEC 1.010 06/11/2019 1600   PHURINE 6.5 06/11/2019 1600   GLUCOSEU NEGATIVE 06/11/2019 1600   GLUCOSEU > or = 1000 mg/dL (AA) 03/01/2008 1109   HGBUR SMALL (A) 06/11/2019 1600   BILIRUBINUR NEGATIVE 06/11/2019 1600   KETONESUR NEGATIVE 06/11/2019 1600   PROTEINUR NEGATIVE 06/11/2019 1600   UROBILINOGEN 0.2 mg/dL 03/01/2008 1109   NITRITE NEGATIVE 06/11/2019  1600   LEUKOCYTESUR SMALL (A) 06/11/2019 1600   Sepsis Labs: !!!!!!!!!!!!!!!!!!!!!!!!!!!!!!!!!!!!!!!!!!!! @LABRCNTIP (procalcitonin:4,lacticidven:4) )No results found for this or any previous visit (from the past 240 hour(s)).   Radiological Exams on Admission: CT ABDOMEN PELVIS WO CONTRAST  Result Date: 06/11/2019 CLINICAL DATA:  Worsening renal function, recent admission for weakness and COVID-19 infection EXAM: CT ABDOMEN AND PELVIS WITHOUT CONTRAST TECHNIQUE: Multidetector CT imaging of the abdomen and pelvis was performed following the standard protocol without IV contrast. COMPARISON:  CT 05/27/2019 FINDINGS: Lower chest: Right pleural effusion, slightly increased in size from comparison exam. Adjacent atelectatic change. Subpleural regions of fibrosis are similar to prior. Reticulonodular opacities in the anterior left lower lobe as well as calcified nodularity in the right lung base are similar to prior. Hepatobiliary: Likely focal fatty infiltration near the false form ligament. Smooth hepatic surface contour. No visible or contour deforming hepatic lesions. Layering density within the gallbladder may reflect biliary sludge. No biliary ductal dilatation or calcified gallstones. No pericholecystic inflammation. Pancreas: Unremarkable. No pancreatic ductal dilatation or surrounding inflammatory changes. Spleen: Normal in size without focal abnormality. Adrenals/Urinary Tract: Normal adrenal gland. Redemonstration of a complex cystic lesion arising from the lower pole right kidney measuring up to 2.8 x 4.1 x 3.7 cm in size with some internal calcified septation noted. Moderate bilateral hydroureteronephrosis is slightly increased from comparison with increasing Peri ureteral stranding and some probable urothelial thickening to the level of what appears to be an ileal conduit and urostomy in the right lower quadrant. No distension of the urinary conduit system at this time. Patient is post cysto  prostatectomy. Irregular soft tissue is noted in the surgical bed including in amorphous region of soft tissue attenuation measuring up to 7.4 x 2.8 cm, previously 6.8 x 2.6 cm. Stomach/Bowel: Distal esophagus, stomach and duodenum are unremarkable. No small bowel dilatation or wall thickening. Small bowel anastomosis present in the left lower quadrant adjacent the ileal conduit. Difficult to fully ascertain the configuration of the bowel in this region due to lack of intravenous contrast or enteric contrast media. Some small amount of fecalization adjacent  the conduit could reflect slowed transit in this location but without resultant upstream obstruction. There is a normal appendix in the right lower quadrant. No colonic dilatation or wall thickening. Few scattered distal colonic diverticula are present. No diverticular inflammation is seen to suggest active diverticulitis Vascular/Lymphatic: Atherosclerotic calcification of the aorta and branch vessels. No aneurysm or ectasia. Retroperitoneal and periaortic adenopathy is similar to comparison. Largest nodes include a 16 mm node at the level of the bifurcation (2/51) a 14 mm right aortocaval node 2/36 and a 15 mm left periaortic node (2/42). Extensive pathologically enlarged pelvic adenopathy is also present, largest is a multilobular nodal conglomerate in the left inguinal region measuring to 2.5 by 5.3 cm, more confluent in appearance from the comparison study and measuring only 4.2 x 2.7 cm at that time. Reproductive: Post cysto prostatectomy, as detailed above. Irregular soft tissue attenuation in the operative site of the pelvis, as detailed above. Other: Increasing circumferential body wall edema is noted. Fat containing bilateral inguinal hernias are again seen but there are postsurgical changes of the ventral midline abdomen are again noted. Overall increased volume of ascites, now moderate. Musculoskeletal: Persistent irregular sacral lucency at S3. No  new suspicious osseous lesions are clearly evident. Multilevel degenerative changes are present in the imaged portions of the spine. IMPRESSION: 1. Slight interval increase in the moderate bilateral hydroureteronephrosis with increasing periureteral stranding and some probable urothelial thickening to the level of what appears to be an ileal conduit and urostomy in the right lower quadrant. Findings could reflect the result of infection or obstruction. Correlate with output and clinical symptoms. 2. Other postsurgical changes from cysto prostatectomy with increasing pelvic soft tissue nodularity and extensive retroperitoneal and pelvic adenopathy compatible with worsening metastatic disease. 3. Similar appearance of a complex cystic lesion arising from the lower pole of the right kidney. Previously characterized as Bosniak IIF. 4. Irregular lucency at S3, nonspecific. Could reflect skeletal metastasis however. 5. Worsening features of anasarca including pleural effusion, moderate ascites, circumferential body wall edema. 6. Aortic Atherosclerosis (ICD10-I70.0). Electronically Signed   By: Lovena Le M.D.   On: 06/11/2019 17:23   Old chart reviewed Case discussed with edp   Assessment/Plan 69 yo male h/o bladder cancer comes in with worsening aki and hydroureter sent in from urology office for procedure in am and correction of aki  Principal Problem:   AKI (acute kidney injury) ([place])- with worsening hydroureter bilaterally.  Imaging shows some stranding in ureters.  ua not very impressive for infection, but will cover with rocephin iv and obtain uc.  Urology consulted and will see in am , further recs per urology team.  Keep npo for now in case able to do procedure in am.  ivf overnight.  Active Problems:   Bladder cancer ([place])- as above    COVID-19 virus infection- 2 weeks since positive.  Repeat screen today.  No resp symptoms and 02 sats nml    CKD (chronic kidney disease), stage III- noted,  with aki as above   Recent dvt- inr 3.9, hold coumadin for now.  DVT prophylaxis: on coumadin inr 3.9, holding for procedure Code Status: full Family Communication:  none Disposition Plan:  1-3 days Consults called:  Sent by urology Admission status:  admission   Madelynn Malson A MD [facility]  If 7PM-7AM, please contact night-coverage [email or web address] Password [email or web address]  06/12/2019, 2:09 AM

## 2019-06-12 NOTE — Progress Notes (Signed)
Events noted in the last few days.  Patient known to me for with history of stage IV bladder cancer diagnosed in January 2020 and status post chemotherapy followed by radical cystectomy in July 2020.  He developed disease progression with retroperitoneal adenopathy.  He is hospitalized with Covid pneumonia and subsequently had a DVT in January 2021.  He was admitted again with worsening renal failure and CT scan obtained on June 11, 2019 was personally reviewed which showed worsening progression of his disease including retroperitoneal adenopathy and bilateral hydronephrosis.  The natural course of this disease was discussed today with the patient and with his wife via phone.  Ramification of these findings as well as treatment options moving forward were discussed today in detail.  I offered him 2 choices moving forward.  The aggressive approach would be to proceed with percutaneous nephrostomy tube and attempt to palliate his cancer with anticancer treatment with predominantly immunotherapy given his rapid progression on systemic chemotherapy.  This approach would offer him a short period of palliation and definitely not curative.  Alternatively, proceeding with more palliative care approach as well as supportive management without any anticancer treatment and potentially hospice would be the alternative.  Risks and benefits of both approaches were reviewed.  Prognosis as well as anticipated length of life were reviewed again in detail.  He understands without any cancer treatment he is looking at overall life expectancy in the months at most.  Even with treatment and reasonable response to treatment it is unlikely that he will survive beyond the next 6 to 12 months.  We also discussed the role of having percutaneous nephrostomy tube as well as the impact on his quality of life moving forward as well as the possibility of worsening renal failure with.   All his questions were answered today to  his satisfaction.  At this time he will consider these options and will make his decision in the near future.   30  minutes was spent with the patient.  The majority of time was dedicated to reviewing imaging studies, discussing treatment options, answering questions about future plan of care and overall prognosis.

## 2019-06-12 NOTE — Consult Note (Signed)
Consultation: AKI, bilateral hydronephrosis  Requested by: Dr. Wynetta Fines  History of Present Illness:   1 - Metastatic Bladder Cancer - s/p robotic cystoprosatatecxtomy with conduit diversion 11/2018 for [id number] micropapillary predominant cancer after 5 cycles neoadjuvant gem-cis under care of Dr. Alen Blew. Bilateral positive pelvic nodes (iliac, obturator). CT 04/2019 with progressive pelvic and retroperitoneal metastasis. CT this admission with again progressive pelvic and retroperitoneal metastasis (LAD and pelvic mass).   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 He is diabetic. Cr rise to low 3s Dec 2020 with bilateral hydro on CT. Cr improved to around 2.4 with IV fluids and better po intake. Pt seen at PCP yesterday and Cr back up to 3.12 with hyponatremia and hyperkalemia. Pt again admits to poor PO intake, vomiting yesterday (no bowel issue on CT) and diarrhea. He c/o 15 lb weight gain and swelling around both his ankles. Also, bilateral hydro a little more pronounced on CT this admission. A foley passed through the fascia and conduit without diffuculty. UOP 300 last shift.    Today " [name] " is stable. No fever. GFR about the same. Says he has trouble eating hospital food.    Past Medical History:  Diagnosis Date  . Bladder cancer ([place])   . Cellulitis    Left leg   . Constipation   . Diabetes mellitus without complication ([place])   . Diabetic neuropathy ([place])    Feet  . Hematuria 04/21/2018  . Hyperlipidemia   . 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 Vasculitis ([place])    Left leg   Past Surgical History:  Procedure Laterality Date  . CYSTOSCOPY WITH INJECTION N/A 11/26/2018   Procedure: CYSTOSCOPY WITH INJECTION OF INDOCYANINE GREEN DYE;  Surgeon: Alexis Frock, MD;  Location: [facility];  Service: Urology;  Laterality: N/A;  . IR IMAGING GUIDED PORT INSERTION  06/26/2018  . MOUTH SURGERY  1980   . TONSILLECTOMY     4th grade  . TRANSURETHRAL RESECTION OF BLADDER TUMOR N/A 06/16/2018   Procedure: TRANSURETHRAL RESECTION OF BLADDER TUMOR (TURBT);  Surgeon: Lucas Mallow, MD;  Location: [facility];  Service: Urology;  Laterality: N/A;    Home Medications:  Medications Prior to Admission  Medication Sig Dispense Refill Last Dose  . acetaminophen (TYLENOL) 500 MG tablet Take 1,000 mg by mouth every 6 (six) hours.    06/11/2019 at Unknown time  . ascorbic acid (VITAMIN C) 500 MG tablet Take 1 tablet (500 mg total) by mouth 2 (two) times daily for 14 days. 28 tablet 0 06/11/2019 at Unknown time  . Cholecalciferol (VITAMIN D3) 125 MCG (5000 UT) CAPS Take 5,000 Units by mouth daily.   06/11/2019 at Unknown time  . Cyanocobalamin (VITAMIN B-12 PO) Take 4,000 mcg by mouth daily.    06/11/2019 at Unknown time  . magnesium oxide (MAG-OX) 400 MG tablet TAKE 1 TABLET BY MOUTH EVERY DAY (Patient taking differently: Take 400 mg by mouth daily. ) 30 tablet 0 06/11/2019 at Unknown time  . ondansetron (ZOFRAN ODT) 4 MG disintegrating tablet 29m ODT q4 hours prn nausea/vomit (Patient taking differently: Take 4 mg by mouth every 4 (four) hours as needed for nausea or vomiting. ) 20 tablet 0 Past Week at Unknown time  . patiromer (VELTASSA) 8.4 g packet Take 1 packet (8.4 g total) by mouth daily for 15 days. 15 packet 0 06/11/2019 at Unknown time  .  polyethylene glycol powder (MIRALAX) 17 GM/SCOOP powder Take 1 Container by mouth daily.    Past Week at Unknown time  . Probiotic Product (PROBIOTIC DAILY PO) Take 1 capsule by mouth daily.   06/11/2019 at Unknown time  . prochlorperazine (COMPAZINE) 10 MG tablet TAKE 1 TABLET BY MOUTH EVERY 6 HOURS AS NEEDED FOR NAUSEA OR VOMITING. (Patient taking differently: Take 10 mg by mouth every 6 (six) hours as needed for nausea or vomiting. ) 30 tablet 0 unk  . senna-docusate (SENNA S) 8.6-50 MG tablet Take 1 tablet by mouth 2 (two) times daily. (Patient taking differently:  Take 1 tablet by mouth daily as needed for mild constipation. ) 90 tablet 3 Past Week at Unknown time  . Zinc 30 MG TABS Take 1 tablet by mouth daily.    06/11/2019 at Unknown time  . warfarin (COUMADIN) 5 MG tablet Take 1 tablet (5 mg total) by mouth daily. 30 tablet 11 on hold   Allergies:  Allergies  Allergen Reactions  . Sulfa Antibiotics Hives    History reviewed. No pertinent family history. Social History:  reports that he has quit smoking. He has never used smokeless tobacco. He reports previous alcohol use of about 1.0 standard drinks of alcohol per week. He reports previous drug use.  ROS: A complete review of systems was performed.  All systems are negative except for pertinent findings as noted. Review of Systems  Constitutional: Positive for malaise/fatigue.  Cardiovascular: Positive for leg swelling.  Gastrointestinal: Positive for diarrhea and vomiting.     Physical Exam:  Vital signs in last 24 hours: Temp:  [97.8 F (36.6 C)-98.8 F (37.1 C)] 98.7 F (37.1 C) (01/15 0554) Pulse Rate:  [70-89] 74 (01/15 0554) Resp:  [12-19] 18 (01/15 0554) BP: (128-182)/(62-80) 158/70 (01/15 0554) SpO2:  [95 %-99 %] 95 % (01/15 0554) Weight:  [81 kg-81.6 kg] 81 kg (01/14 1434)    Intake/Output Summary (Last 24 hours) at 06/12/2019 0912 Last data filed at 06/12/2019 0558 Gross per 24 hour  Intake 951.5 ml  Output 300 ml  Net 651.5 ml     General:  Alert and oriented, No acute distress HEENT: Normocephalic, atraumatic Cardiovascular: Regular rate and rhythm Lungs: Regular rate and effort Abdomen: Soft, nontender, nondistended, no abdominal masses, IC pink a viable. I passed a 16 Fr foley through the fascia and filled to 2.5 cc in balloon (was going to leave for possible loopogram), the balloon and foley fell right out indicating no fascial obstruction.  Back: No CVA tenderness Extremities: 3+ edema bilateral LE Neurologic: Grossly intact  Laboratory Data:  Results for  orders placed or performed during the hospital encounter of 06/11/19 (from the past 24 hour(s))  Comprehensive metabolic panel     Status: Abnormal   Collection Time: 06/11/19  2:46 PM  Result Value Ref Range   Sodium 132 (L) 135 - 145 mmol/L   Potassium 5.5 (H) 3.5 - 5.1 mmol/L   Chloride 101 98 - 111 mmol/L   CO2 20 (L) 22 - 32 mmol/L   Glucose, Bld 197 (H) 70 - 99 mg/dL   BUN 65 (H) 8 - 23 mg/dL   Creatinine, Ser 3.17 (H) 0.61 - 1.24 mg/dL   Calcium 8.6 (L) 8.9 - 10.3 mg/dL   Total Protein 6.0 (L) 6.5 - 8.1 g/dL   Albumin 2.9 (L) 3.5 - 5.0 g/dL   AST 19 15 - 41 U/L   ALT 23 0 - 44 U/L   Alkaline Phosphatase 72  38 - 126 U/L   Total Bilirubin 0.3 0.3 - 1.2 mg/dL   GFR calc non Af Amer 19 (L) >60 mL/min   GFR calc Af Amer 22 (L) >60 mL/min   Anion gap 11 5 - 15  CBC with Differential     Status: Abnormal   Collection Time: 06/11/19  2:46 PM  Result Value Ref Range   WBC 11.4 (H) 4.0 - 10.5 K/uL   RBC 2.69 (L) 4.22 - 5.81 MIL/uL   Hemoglobin 8.0 (L) 13.0 - 17.0 g/dL   HCT 25.2 (L) 39.0 - 52.0 %   MCV 93.7 80.0 - 100.0 fL   MCH 29.7 26.0 - 34.0 pg   MCHC 31.7 30.0 - 36.0 g/dL   RDW 15.2 11.5 - 15.5 %   Platelets 243 150 - 400 K/uL   nRBC 0.0 0.0 - 0.2 %   Neutrophils Relative % 82 %   Neutro Abs 9.4 (H) 1.7 - 7.7 K/uL   Lymphocytes Relative 7 %   Lymphs Abs 0.8 0.7 - 4.0 K/uL   Monocytes Relative 7 %   Monocytes Absolute 0.8 0.1 - 1.0 K/uL   Eosinophils Relative 2 %   Eosinophils Absolute 0.2 0.0 - 0.5 K/uL   Basophils Relative 1 %   Basophils Absolute 0.1 0.0 - 0.1 K/uL   Immature Granulocytes 1 %   Abs Immature Granulocytes 0.09 (H) 0.00 - 0.07 K/uL  Lipase, blood     Status: None   Collection Time: 06/11/19  2:46 PM  Result Value Ref Range   Lipase 20 11 - 51 U/L  Protime-INR     Status: Abnormal   Collection Time: 06/11/19  2:46 PM  Result Value Ref Range   Prothrombin Time 38.4 (H) 11.4 - 15.2 seconds   INR 3.9 (H) 0.8 - 1.2  Urinalysis, Routine w reflex  microscopic     Status: Abnormal   Collection Time: 06/11/19  4:00 PM  Result Value Ref Range   Color, Urine YELLOW YELLOW   APPearance CLEAR CLEAR   Specific Gravity, Urine 1.010 1.005 - 1.030   pH 6.5 5.0 - 8.0   Glucose, UA NEGATIVE NEGATIVE mg/dL   Hgb urine dipstick SMALL (A) NEGATIVE   Bilirubin Urine NEGATIVE NEGATIVE   Ketones, ur NEGATIVE NEGATIVE mg/dL   Protein, ur NEGATIVE NEGATIVE mg/dL   Nitrite NEGATIVE NEGATIVE   Leukocytes,Ua SMALL (A) NEGATIVE  Urinalysis, Microscopic (reflex)     Status: Abnormal   Collection Time: 06/11/19  4:00 PM  Result Value Ref Range   RBC / HPF 0-5 0 - 5 RBC/hpf   WBC, UA 0-5 0 - 5 WBC/hpf   Bacteria, UA FEW (A) NONE SEEN   Squamous Epithelial / LPF 0-5 0 - 5  Basic metabolic panel     Status: Abnormal   Collection Time: 06/11/19 11:34 PM  Result Value Ref Range   Sodium 134 (L) 135 - 145 mmol/L   Potassium 5.7 (H) 3.5 - 5.1 mmol/L   Chloride 103 98 - 111 mmol/L   CO2 21 (L) 22 - 32 mmol/L   Glucose, Bld 145 (H) 70 - 99 mg/dL   BUN 64 (H) 8 - 23 mg/dL   Creatinine, Ser 3.07 (H) 0.61 - 1.24 mg/dL   Calcium 8.3 (L) 8.9 - 10.3 mg/dL   GFR calc non Af Amer 20 (L) >60 mL/min   GFR calc Af Amer 23 (L) >60 mL/min   Anion gap 10 5 - 15  Basic metabolic panel  Status: Abnormal   Collection Time: 06/12/19  5:00 AM  Result Value Ref Range   Sodium 137 135 - 145 mmol/L   Potassium 5.6 (H) 3.5 - 5.1 mmol/L   Chloride 103 98 - 111 mmol/L   CO2 22 22 - 32 mmol/L   Glucose, Bld 124 (H) 70 - 99 mg/dL   BUN 61 (H) 8 - 23 mg/dL   Creatinine, Ser 3.15 (H) 0.61 - 1.24 mg/dL   Calcium 8.4 (L) 8.9 - 10.3 mg/dL   GFR calc non Af Amer 19 (L) >60 mL/min   GFR calc Af Amer 22 (L) >60 mL/min   Anion gap 12 5 - 15  CBC     Status: Abnormal   Collection Time: 06/12/19  5:00 AM  Result Value Ref Range   WBC 10.0 4.0 - 10.5 K/uL   RBC 2.55 (L) 4.22 - 5.81 MIL/uL   Hemoglobin 7.5 (L) 13.0 - 17.0 g/dL   HCT 24.0 (L) 39.0 - 52.0 %   MCV 94.1 80.0 -  100.0 fL   MCH 29.4 26.0 - 34.0 pg   MCHC 31.3 30.0 - 36.0 g/dL   RDW 15.2 11.5 - 15.5 %   Platelets 245 150 - 400 K/uL   nRBC 0.0 0.0 - 0.2 %   No results found for this or any previous visit (from the past 240 hour(s)). Creatinine: Recent Labs    06/11/19 1038 06/11/19 1446 06/11/19 2334 06/12/19 0500  CREATININE 3.12* 3.17* 3.07* 3.15*    Impression/Assessment/plan:  1- AKI - again multifactorial. Follow UOP, Cr. Consider Nephrostomy tubes also depending on goals of care.   2- metastatic bladder ca - per Oncology. Progressive and not curable.   I spoke with Dr. Cyndia Skeeters and Dr. Tresa Moore about he patient. Dr. Tresa Moore on this weekend and will follow.   Festus Aloe 06/12/2019, 9:07 AM

## 2019-06-12 NOTE — Progress Notes (Signed)
PROGRESS NOTE  Sergio Pennington LDJ:[id number] DOB: Sep 30, 1950   PCP: Darreld Mclean, MD  Patient is from: Home.  DOA: 06/11/2019 LOS: 1  Brief Narrative / Interim history: 69 year old male with history of bladder cancer with possible mets to pelvic and retroperitoneal lymph nodes, s/p cystoprostatectomy/ileal conduit and urostomy by Dr. Tresa Moore in 11/2018, DM-2, HTN, recent hospitalization from 12/30-1/8 for [id number] infection complicated by DVT now on warfarin sent to ED from urology office for worsening AKI, abdominal pain, weakness and fatigue.  In ED, hemodynamically stable.  On room air.  NA 133.  K5.5. Cr 3.17 (baseline 2.3-2.4).  BUN 62.  WBC 11.4.  Hgb 8.0 otherwise, CBC and CMP without significant finding.  PT/INR 38.4/3.9.  CT abdomen and pelvis with worsening moderate bilateral hydronephrosis, increased periureteral stranding and probable urothelial thickening to the level of ileal conduit, increasing pelvic soft tissue nodularity and extensive retroperitoneal and pelvic adenopathy compatible with worsening metastasis, stable complex cystic lesion of right kidney and irregular lucency at L3, and worsening anasarca including pleural effusion and moderate ascites.  Admitted for AKI, hyperkalemia and possible complicated UTI.  Cultures obtained.  Started on ceftriaxone.  Urology consulted  Subjective: No major events overnight of this morning.  Endorses low abdominal pain across lower abdomen.  Also endorses about 30 pound weight gain in the last 1 months.  Denies cough, shortness of breath or chest pain.  Had some nausea, emesis and diarrhea prior to admission.  Objective: Vitals:   06/12/19 0105 06/12/19 0122 06/12/19 0554 06/12/19 1300  BP: (!) 182/80 (!) 156/68 (!) 158/70 (!) 150/80  Pulse: 74 70 74 70  Resp: 18  18 18   Temp: 97.8 F (36.6 C)  98.7 F (37.1 C) 99 F (37.2 C)  TempSrc: Oral  Oral Oral  SpO2: 99%  95% 96%  Weight:      Height:         Intake/Output Summary (Last 24 hours) at 06/12/2019 1402 Last data filed at 06/12/2019 0915 Gross per 24 hour  Intake 951.5 ml  Output 300 ml  Net 651.5 ml   Filed Weights   06/11/19 1434  Weight: 81 kg    Examination:  GENERAL: No apparent distress.  Nontoxic. HEENT: MMM.  Vision and hearing grossly intact.  NECK: Supple.  No apparent JVD.  RESP:  No IWOB. Good air movement bilaterally. CVS:  RRR. Heart sounds normal.  ABD/GI/GU: Bowel sounds present. Soft.  Mild tenderness across lower abdomen.  No rebound or guarding.  Urostomy over RLQ. MSK/EXT:  Moves extremities. No apparent deformity.  1-2 pedal pitting edema bilaterally. SKIN: no apparent skin lesion or wound NEURO: Awake, alert and oriented appropriately.  No apparent focal neuro deficit. PSYCH: Calm. Normal affect.   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  BUN 61 favoring prerenal etiology from dehydration in the setting of poor p.o. intake, nausea/vomiting and diarrhea.  You also has moderate hydrobilaterally concerning for obstructive etiology.  He does not appear to be on nephrotoxic meds. -Continue IV fluid -Avoid nephrotoxic meds -Urology for bilateral hydronephrosis.  Complicated urinary tract infection in a patient with ileal conduit and urostomy: CT abdomen and pelvis with  increased periureteral stranding and probable urothelial thickening to the level of ileal conduit.  No history of MDR or ESBL. -Continue ceftriaxone -Follow cultures  History of bladder cancer s/p cystoprostatectomy with ileal conduit and urostomy concerning for metastasis to pelvic and retroperitoneal lymph  nodes, right kidney and sacral spine-followed by Dr. Tresa Moore, urology and Dr. Alen Blew, oncology -Management per urology and oncology-both consulted.  Anasarca: Multifactorial including anemia, hypoalbuminemia and malignancy.  This could get worse with IV fluid -Infuse albumin  -Transfuse 1 unit.  Symptomatic mixed iron deficiency anemia and anemia of chronic disease: Baseline Hgb 8-9> 8.0 (admit)> 7.5.  Anemia panel on 12/31 concerning with iron deficiency but patient was is COVID-19 which makes it difficult to interpret. -Transfuse 1 unit -Monitor H&H  Hyperkalemia: Received Kayexalate -Continue IV normal saline  COVID-19 infection: Tested positive on 05/27/2019.  Hospitalized 12/30-1/8.  Completed treatment course.  No respiratory symptoms.  COVID-19 PCR negative.   -Continue airborne and contact precautions until 06/18/2019 despite negative result.  DVT: Recently diagnosed.  INR elevated. -Warfarin per pharmacy once INR within goal.               DVT prophylaxis: INR supratherapeutic.  Warfarin once INR is therapeutic. Code Status: Full code Family Communication: Patient and/or RN. Available if any question.  Disposition Plan: Remains inpatient for AKI, complicated UTI and hyperkalemia. Consultants: Urology, oncology   Microbiology summarized: 12/30-POC COVID-19 positive. 1/15-COVID-19 negative. 1/15-urine culture pending.  Sch Meds:  Scheduled Meds: . Chlorhexidine Gluconate Cloth  6 each Topical Daily  . sodium chloride flush  10-40 mL Intracatheter Q12H   Continuous Infusions: . sodium chloride 500 mL (06/11/19 1804)  . sodium chloride 100 mL/hr at 06/12/19 0553  . cefTRIAXone (ROCEPHIN)  IV     PRN Meds:.sodium chloride, ondansetron **OR** ondansetron (ZOFRAN) IV, sodium chloride flush  Antimicrobials: Anti-infectives (From admission, onward)   Start     Dose/Rate Route Frequency Ordered Stop   06/12/19 1800  cefTRIAXone (ROCEPHIN) 1 g in sodium chloride 0.9 % 100 mL IVPB     1 g 200 mL/hr over 30 Minutes Intravenous Every 24 hours 06/12/19 0207     06/11/19 1800  cefTRIAXone (ROCEPHIN) 1 g in sodium chloride 0.9 % 100 mL IVPB     1 g 200 mL/hr over 30 Minutes Intravenous  Once 06/11/19 1747 06/11/19 1837       I have  personally reviewed the following labs and images: CBC: Recent Labs  Lab 06/11/19 1038 06/11/19 1446 06/12/19 0500  WBC 11.9* 11.4* 10.0  NEUTROABS  --  9.4*  --   HGB 9.0* 8.0* 7.5*  HCT 27.4* 25.2* 24.0*  MCV 90.3 93.7 94.1  PLT 325.0 243 245   BMP &GFR Recent Labs  Lab 06/11/19 1038 06/11/19 1446 06/11/19 2334 06/12/19 0500  NA 133* 132* 134* 137  K 5.7 No hemolysis seen* 5.5* 5.7* 5.6*  CL 99 101 103 103  CO2 23 20* 21* 22  GLUCOSE 132* 197* 145* 124*  BUN 62* 65* 64* 61*  CREATININE 3.12* 3.17* 3.07* 3.15*  CALCIUM 8.7 8.6* 8.3* 8.4*   Estimated Creatinine Clearance: 24 mL/min (A) (by C-G formula based on SCr of 3.15 mg/dL (H)). Liver & Pancreas: Recent Labs  Lab 06/11/19 1446  AST 19  ALT 23  ALKPHOS 72  BILITOT 0.3  PROT 6.0*  ALBUMIN 2.9*   Recent Labs  Lab 06/11/19 1446  LIPASE 20   No results for input(s): AMMONIA in the last 168 hours. Diabetic: No results for input(s): HGBA1C in the last 72 hours. No results for input(s): GLUCAP in the last 168 hours. Cardiac Enzymes: No results for input(s): CKTOTAL, CKMB, CKMBINDEX, TROPONINI in the last 168 hours. No results for input(s): PROBNP in the last 8760 hours.  Coagulation Profile: Recent Labs  Lab 06/11/19 1000 06/11/19 1446  INR 5.9* 3.9*   Thyroid Function Tests: No results for input(s): TSH, T4TOTAL, FREET4, T3FREE, THYROIDAB in the last 72 hours. Lipid Profile: No results for input(s): CHOL, HDL, LDLCALC, TRIG, CHOLHDL, LDLDIRECT in the last 72 hours. Anemia Panel: No results for input(s): VITAMINB12, FOLATE, FERRITIN, TIBC, IRON, RETICCTPCT in the last 72 hours. Urine analysis:    Component Value Date/Time   COLORURINE YELLOW 06/11/2019 1600   APPEARANCEUR CLEAR 06/11/2019 1600   LABSPEC 1.010 06/11/2019 1600   PHURINE 6.5 06/11/2019 1600   GLUCOSEU NEGATIVE 06/11/2019 1600   GLUCOSEU > or = 1000 mg/dL (AA) 03/01/2008 1109   HGBUR SMALL (A) 06/11/2019 1600   BILIRUBINUR NEGATIVE  06/11/2019 1600   KETONESUR NEGATIVE 06/11/2019 1600   PROTEINUR NEGATIVE 06/11/2019 1600   UROBILINOGEN 0.2 mg/dL 03/01/2008 1109   NITRITE NEGATIVE 06/11/2019 1600   LEUKOCYTESUR SMALL (A) 06/11/2019 1600   Sepsis Labs: Invalid input(s): PROCALCITONIN, [place]  Microbiology: Recent Results (from the past 240 hour(s))  SARS CORONAVIRUS 2 (TAT 6-24 HRS) Nasopharyngeal Nasopharyngeal Swab     Status: None   Collection Time: 06/12/19  2:03 AM   Specimen: Nasopharyngeal Swab  Result Value Ref Range Status   SARS Coronavirus 2 NEGATIVE NEGATIVE Final    Comment: (NOTE) SARS-CoV-2 target nucleic acids are NOT DETECTED. The SARS-CoV-2 RNA is generally detectable in upper and lower respiratory specimens during the acute phase of infection. Negative results do not preclude SARS-CoV-2 infection, do not rule out co-infections with other pathogens, and should not be used as the sole basis for treatment or other patient management decisions. Negative results must be combined with clinical observations, patient history, and epidemiological information. The expected result is Negative. Fact Sheet for Patients: SugarRoll.be Fact Sheet for Healthcare Providers: https://www.woods-mathews.com/ This test is not yet approved or cleared by the [place] FDA and  has been authorized for detection and/or diagnosis of SARS-CoV-2 by FDA under an Emergency Use Authorization (EUA). This EUA will remain  in effect (meaning this test can be used) for the duration of the COVID-19 declaration under Section 56 4(b)(1) of the Act, 21 U.S.C. section 360bbb-3(b)(1), unless the authorization is terminated or revoked sooner. Performed at [facility], [place] [address] [place], [place] [address]     Radiology Studies: CT ABDOMEN PELVIS WO CONTRAST  Result Date: 06/11/2019 CLINICAL DATA:  Worsening renal function, recent admission for weakness and COVID-19  infection EXAM: CT ABDOMEN AND PELVIS WITHOUT CONTRAST TECHNIQUE: Multidetector CT imaging of the abdomen and pelvis was performed following the standard protocol without IV contrast. COMPARISON:  CT 05/27/2019 FINDINGS: Lower chest: Right pleural effusion, slightly increased in size from comparison exam. Adjacent atelectatic change. Subpleural regions of fibrosis are similar to prior. Reticulonodular opacities in the anterior left lower lobe as well as calcified nodularity in the right lung base are similar to prior. Hepatobiliary: Likely focal fatty infiltration near the false form ligament. Smooth hepatic surface contour. No visible or contour deforming hepatic lesions. Layering density within the gallbladder may reflect biliary sludge. No biliary ductal dilatation or calcified gallstones. No pericholecystic inflammation. Pancreas: Unremarkable. No pancreatic ductal dilatation or surrounding inflammatory changes. Spleen: Normal in size without focal abnormality. Adrenals/Urinary Tract: Normal adrenal gland. Redemonstration of a complex cystic lesion arising from the lower pole right kidney measuring up to 2.8 x 4.1 x 3.7 cm in size with some internal calcified septation noted. Moderate bilateral hydroureteronephrosis is slightly increased from comparison  with increasing Peri ureteral stranding and some probable urothelial thickening to the level of what appears to be an ileal conduit and urostomy in the right lower quadrant. No distension of the urinary conduit system at this time. Patient is post cysto prostatectomy. Irregular soft tissue is noted in the surgical bed including in amorphous region of soft tissue attenuation measuring up to 7.4 x 2.8 cm, previously 6.8 x 2.6 cm. Stomach/Bowel: Distal esophagus, stomach and duodenum are unremarkable. No small bowel dilatation or wall thickening. Small bowel anastomosis present in the left lower quadrant adjacent the ileal conduit. Difficult to fully ascertain the  configuration of the bowel in this region due to lack of intravenous contrast or enteric contrast media. Some small amount of fecalization adjacent the conduit could reflect slowed transit in this location but without resultant upstream obstruction. There is a normal appendix in the right lower quadrant. No colonic dilatation or wall thickening. Few scattered distal colonic diverticula are present. No diverticular inflammation is seen to suggest active diverticulitis Vascular/Lymphatic: Atherosclerotic calcification of the aorta and branch vessels. No aneurysm or ectasia. Retroperitoneal and periaortic adenopathy is similar to comparison. Largest nodes include a 16 mm node at the level of the bifurcation (2/51) a 14 mm right aortocaval node 2/36 and a 15 mm left periaortic node (2/42). Extensive pathologically enlarged pelvic adenopathy is also present, largest is a multilobular nodal conglomerate in the left inguinal region measuring to 2.5 by 5.3 cm, more confluent in appearance from the comparison study and measuring only 4.2 x 2.7 cm at that time. Reproductive: Post cysto prostatectomy, as detailed above. Irregular soft tissue attenuation in the operative site of the pelvis, as detailed above. Other: Increasing circumferential body wall edema is noted. Fat containing bilateral inguinal hernias are again seen but there are postsurgical changes of the ventral midline abdomen are again noted. Overall increased volume of ascites, now moderate. Musculoskeletal: Persistent irregular sacral lucency at S3. No new suspicious osseous lesions are clearly evident. Multilevel degenerative changes are present in the imaged portions of the spine. IMPRESSION: 1. Slight interval increase in the moderate bilateral hydroureteronephrosis with increasing periureteral stranding and some probable urothelial thickening to the level of what appears to be an ileal conduit and urostomy in the right lower quadrant. Findings could reflect  the result of infection or obstruction. Correlate with output and clinical symptoms. 2. Other postsurgical changes from cysto prostatectomy with increasing pelvic soft tissue nodularity and extensive retroperitoneal and pelvic adenopathy compatible with worsening metastatic disease. 3. Similar appearance of a complex cystic lesion arising from the lower pole of the right kidney. Previously characterized as Bosniak IIF. 4. Irregular lucency at S3, nonspecific. Could reflect skeletal metastasis however. 5. Worsening features of anasarca including pleural effusion, moderate ascites, circumferential body wall edema. 6. Aortic Atherosclerosis (ICD10-I70.0). Electronically Signed   By: Lovena Le M.D.   On: 06/11/2019 17:23    45 minutes with more than 50% spent in reviewing records, counseling patient/family and coordinating care.   T. [place]  If 7PM-7AM, please contact night-coverage [email or web address] Password [facility] 06/12/2019, 2:03 PM

## 2019-06-12 NOTE — Plan of Care (Signed)

## 2019-06-12 NOTE — Progress Notes (Signed)
Pt had emesis at 03:19 after taking Kayexalate at 02:56am. Color green liquid , moderate amount.

## 2019-06-12 NOTE — Progress Notes (Signed)
Oncology addendum: Further discussion with the patient and his wife and he would be amendable to having nephrostomy tube placed if felt appropriate by urology pending his renal function in the next few days.

## 2019-06-12 NOTE — ED Notes (Signed)
[facility] arrived to transport pt to [facility]. Pt wife called- no answer- RN left voicemail per patient request of room number and visitation guidelines.

## 2019-06-12 NOTE — ED Notes (Signed)
Wife called back- informed of plan.

## 2019-06-13 LAB — URINE CULTURE: Culture: 60000 — AB

## 2019-06-13 LAB — RENAL FUNCTION PANEL
Albumin: 3.3 g/dL — ABNORMAL LOW (ref 3.5–5.0)
Anion gap: 11 (ref 5–15)
BUN: 61 mg/dL — ABNORMAL HIGH (ref 8–23)
CO2: 21 mmol/L — ABNORMAL LOW (ref 22–32)
Calcium: 8.4 mg/dL — ABNORMAL LOW (ref 8.9–10.3)
Chloride: 104 mmol/L (ref 98–111)
Creatinine, Ser: 3.11 mg/dL — ABNORMAL HIGH (ref 0.61–1.24)
GFR calc Af Amer: 22 mL/min — ABNORMAL LOW (ref >60)
GFR calc non Af Amer: 19 mL/min — ABNORMAL LOW (ref >60)
Glucose, Bld: 143 mg/dL — ABNORMAL HIGH (ref 70–99)
Phosphorus: 4.7 mg/dL — ABNORMAL HIGH (ref 2.5–4.6)
Potassium: 4.7 mmol/L (ref 3.5–5.1)
Sodium: 136 mmol/L (ref 135–145)

## 2019-06-13 LAB — CBC
HCT: 26 % — ABNORMAL LOW (ref 39.0–52.0)
Hemoglobin: 8.2 g/dL — ABNORMAL LOW (ref 13.0–17.0)
MCH: 28.6 pg (ref 26.0–34.0)
MCHC: 31.5 g/dL (ref 30.0–36.0)
MCV: 90.6 fL (ref 80.0–100.0)
Platelets: 239 10*3/uL (ref 150–400)
RBC: 2.87 MIL/uL — ABNORMAL LOW (ref 4.22–5.81)
RDW: 16.7 % — ABNORMAL HIGH (ref 11.5–15.5)
WBC: 10 10*3/uL (ref 4.0–10.5)
nRBC: 0 % (ref 0.0–0.2)

## 2019-06-13 LAB — PROTIME-INR
INR: 3.1 — ABNORMAL HIGH (ref 0.8–1.2)
Prothrombin Time: 32 seconds — ABNORMAL HIGH (ref 11.4–15.2)

## 2019-06-13 LAB — MAGNESIUM: Magnesium: 1.8 mg/dL (ref 1.7–2.4)

## 2019-06-13 NOTE — Evaluation (Signed)
Occupational Therapy Evaluation and Discharge Patient Details Name: Sergio Pennington MRN: [id number] DOB: 1950/10/21 Today's Date: 06/13/2019    History of Present Illness 69 y.o. male with medical history significant of bladder cancer status post urostomy, type 2 diabetes, hypertension, hyperlipidemia, recent hospitalization for COVID 19 admitted from urology office with worsening AKI, generalized weakness and fatigue.    Clinical Impression   Pt is functioning at a supervision level in ADL and mobility with RW. No further OT needs.    Follow Up Recommendations  No OT follow up    Equipment Recommendations  None recommended by OT    Recommendations for Other Services       Precautions / Restrictions Precautions Precautions: Fall Precaution Comments: urostomy Restrictions Weight Bearing Restrictions: No      Mobility Bed Mobility Overal bed mobility: Modified Independent                Transfers Overall transfer level: Needs assistance Equipment used: Rolling walker (2 wheeled) Transfers: Sit to/from Stand Sit to Stand: Supervision         General transfer comment: for safesty. VCs hand placement    Balance Overall balance assessment: Needs assistance   Sitting balance-Leahy Scale: Good       Standing balance-Leahy Scale: Fair                             ADL either performed or assessed with clinical judgement   ADL                                         General ADL Comments: Overall functioning at a supervision level.      Vision Baseline Vision/History: Wears glasses Wears Glasses: At all times Patient Visual Report: No change from baseline       Perception     Praxis      Pertinent Vitals/Pain Pain Assessment: Faces Faces Pain Scale: Hurts a little bit     Hand Dominance Right   Extremity/Trunk Assessment Upper Extremity Assessment Upper Extremity Assessment: Overall WFL for tasks  assessed   Lower Extremity Assessment Lower Extremity Assessment: Defer to PT evaluation   Cervical / Trunk Assessment Cervical / Trunk Assessment: Normal   Communication Communication Communication: No difficulties   Cognition Arousal/Alertness: Awake/alert Behavior During Therapy: WFL for tasks assessed/performed Overall Cognitive Status: Within Functional Limits for tasks assessed                                     General Comments       Exercises     Shoulder Instructions      Home Living Family/patient expects to be discharged to:: Private residence Living Arrangements: Spouse/significant other Available Help at Discharge: Family;Available 24 hours/day Type of Home: House Home Access: Stairs to enter [employer] of Steps: 8 Entrance Stairs-Rails: Can reach both;Left;Right Home Layout: One level     Bathroom Shower/Tub: Occupational psychologist: Handicapped height     Home Equipment: Environmental consultant - 4 wheels;Cane - single point;Shower seat;Grab bars - toilet;Grab bars - tub/shower;Shower seat - built in          Prior Functioning/Environment Level of Independence: Independent with assistive device(s)  OT Problem List:        OT Treatment/Interventions:      OT Goals(Current goals can be found in the care plan section) Acute Rehab OT Goals Patient Stated Goal: return home, pt is aware he is at end of life  OT Frequency:     Barriers to D/C:            Co-evaluation              AM-PAC OT "6 Clicks" Daily Activity     Outcome Measure Help from another person eating meals?: None Help from another person taking care of personal grooming?: None Help from another person toileting, which includes using toliet, bedpan, or urinal?: None Help from another person bathing (including washing, rinsing, drying)?: None Help from another person to put on and taking off regular upper body clothing?:  None Help from another person to put on and taking off regular lower body clothing?: None 6 Click Score: 24   End of Session Equipment Utilized During Treatment: Rolling walker  Activity Tolerance: Patient tolerated treatment well Patient left: in bed;with call bell/phone within reach  OT Visit Diagnosis: Other abnormalities of gait and mobility (R26.89)                Time: 1505-1530 OT Time Calculation (min): 25 min Charges:  OT General Charges $OT Visit: 1 Visit OT Evaluation $OT Eval Moderate Complexity: 1 Mod  Nestor Lewandowsky, OTR/L Acute Rehabilitation Services Pager: [phone] Office: [phone] Malka So 06/13/2019, 3:49 PM

## 2019-06-13 NOTE — Progress Notes (Signed)
PROGRESS NOTE  Sergio Pennington:[id number] DOB: September 01, 1950   PCP: Darreld Mclean, MD  Patient is from: Home.  DOA: 06/11/2019 LOS: 2  Brief Narrative / Interim history: 69 year old male with history of bladder cancer with possible mets to pelvic and retroperitoneal lymph nodes, s/p cystoprostatectomy/ileal conduit and urostomy by Dr. Tresa Moore in 11/2018, DM-2, HTN, recent hospitalization from 12/30-1/8 for TFTDD-22 infection complicated by DVT now on warfarin sent to ED from urology office for worsening AKI, abdominal pain, weakness and fatigue.  In ED, hemodynamically stable.  On room air.  NA 133.  K5.5. Cr 3.17 (baseline 2.3-2.4).  BUN 62.  WBC 11.4.  Hgb 8.0 otherwise, CBC and CMP without significant finding.  PT/INR 38.4/3.9.  CT abdomen and pelvis with worsening moderate bilateral hydronephrosis, increased periureteral stranding and probable urothelial thickening to the level of ileal conduit, increasing pelvic soft tissue nodularity and extensive retroperitoneal and pelvic adenopathy compatible with worsening metastasis, stable complex cystic lesion of right kidney and irregular lucency at L3, and worsening anasarca including pleural effusion and moderate ascites.  Admitted for AKI, hyperkalemia and possible complicated UTI.  Cultures obtained.  Started on ceftriaxone.  Urology and oncology consulted and following.    Subjective: No major events overnight of this morning.  Has no complaints.  He denies pain, shortness of breath, nausea, vomiting or abdominal pain.  Lengthy discussion about goal of care and his wishes.  He states "I am approaching end-of-life.  I know I am not he needs Christmas.  I am not afraid to die. 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  He also expresses his wish to be DNR and DNI.  Patient is interested in palliative care consult when I offered him.  Objective: Vitals:   06/12/19 1915 06/12/19 2156 06/12/19 2200 06/13/19 0539  BP: (!) 156/68  (!) 165/73 (!) 164/73  Pulse: 78  76 73  Resp: 12   18  Temp: 98 F (36.7 C) 98.9 F (37.2 C)  99.1 F (37.3 C)  TempSrc: Oral Oral    SpO2: 95% 97% 98% 96%  Weight:      Height:        Intake/Output Summary (Last 24 hours) at 06/13/2019 1052 Last data filed at 06/13/2019 0556 Gross per 24 hour  Intake 1150.35 ml  Output 1295 ml  Net -144.65 ml   Filed Weights   06/11/19 1434  Weight: 81 kg    Examination:  GENERAL: No apparent distress.  Nontoxic. HEENT: MMM.  Vision and hearing grossly intact.  NECK: Supple.  No apparent JVD.  RESP:  No IWOB. Good air movement bilaterally. CVS:  RRR. Heart sounds normal.  ABD/GI/GU: Bowel sounds present. Soft. Non tender.  Urostomy over RLQ.  Clear urine in foley box. MSK/EXT:  Moves extremities. No apparent deformity.  1-2 pitting pedal edema bilaterally SKIN: no apparent skin lesion or wound NEURO: Awake, alert and oriented appropriately.  No apparent focal neuro deficit. PSYCH: Calm. Normal affect.  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  BUN 61 favoring prerenal etiology from dehydration in the setting of poor p.o. intake, nausea/vomiting and diarrhea.  He also has moderate hydronephrosis bilaterally concerning for obstructive etiology.  He does not appear to be on nephrotoxic meds.  No significant improvement with IV fluid. -Discontinue IV fluid given edema.  Will monitor off  IV fluid. -Avoid nephrotoxic meds -Plan for nephrostomy tube once INR low, likely early next week.  Complicated urinary tract infection in a patient with ileal conduit and urostomy: CT abdomen and pelvis with  increased periureteral stranding and probable urothelial thickening to the level of ileal conduit.  No history of MDR or ESBL.  Urine culture with 60,000 staph epidermidis.  Contaminant? -Continue ceftriaxone  1/14>>  Metastatic bladder cancer s/p cystoprostatectomy with ileal conduit and urostomy concerning for metastasis to pelvic and retroperitoneal lymph nodes, right kidney and sacral spine-followed by Dr. Tresa Moore, urology and Dr. Alen Blew, oncology -Management per urology and oncology-both consulted. -Palliative care consulted.  Anasarca: Multifactorial including anemia, hypoalbuminemia and malignancy.  This could get worse with IV fluid.  Albumin improved after albumin infusion.  Symptomatic mixed iron deficiency anemia and anemia of chronic disease: Baseline Hgb 8-9> 8.0 (admit)> 7.5>1u> 8.2.  Anemia panel on 12/31 concerning with iron deficiency but patient was is COVID-19 which makes it difficult to interpret. -Monitor H&H -Recheck anemia panel  Hyperkalemia: Likely due to renal failure.  Resolved. -Discontinue IV fluid  COVID-19 infection: Tested positive on 05/27/2019.  Hospitalized 12/30-1/8.  Completed treatment course.  No respiratory symptoms.  COVID-19 PCR negative.   -Continue airborne and contact precautions until 06/18/2019 despite negative result.  DVT: Recently diagnosed.  INR elevated but improved.. -Hold warfarin for PCN   Goal of care discussion:  Discussed about goal of care  He states "I am approaching end-of-life.  I know I am not he needs Christmas.  I am not afraid to die. 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  He also expresses his wish to be DNR and DNI.  Patient is interested in palliative care consult when I offered him. -Palliative care consulted. -Changed CODE STATUS to DNR/DNI.                 DVT prophylaxis: INR supratherapeutic.  Warfarin once INR is therapeutic. Code Status: DNR/DNI Family Communication: Updated patient's wife over the phone. Disposition Plan: Remains inpatient for AKI, complicated UTI and hyperkalemia. Consultants: Urology, oncology   Microbiology  summarized: 12/30-POC COVID-19 positive. 1/15-COVID-19 negative. 1/15-urine culture grew staph epidermidis.  Sch Meds:  Scheduled Meds: . sodium chloride   Intravenous Once  . Chlorhexidine Gluconate Cloth  6 each Topical Daily  . sodium chloride flush  10-40 mL Intracatheter Q12H   Continuous Infusions: . sodium chloride 500 mL (06/11/19 1804)  . cefTRIAXone (ROCEPHIN)  IV 1 g (06/12/19 1749)   PRN Meds:.sodium chloride, ondansetron **OR** ondansetron (ZOFRAN) IV, sodium chloride flush  Antimicrobials: Anti-infectives (From admission, onward)   Start     Dose/Rate Route Frequency Ordered Stop   06/12/19 1800  cefTRIAXone (ROCEPHIN) 1 g in sodium chloride 0.9 % 100 mL IVPB     1 g 200 mL/hr over 30 Minutes Intravenous Every 24 hours 06/12/19 0207     06/11/19 1800  cefTRIAXone (ROCEPHIN) 1 g in sodium chloride 0.9 % 100 mL IVPB     1 g 200 mL/hr over 30 Minutes Intravenous  Once 06/11/19 1747 06/11/19 1837       I have personally reviewed the following labs and images: CBC: Recent Labs  Lab 06/11/19 1038 06/11/19 1446 06/12/19 0500 06/13/19 0305  WBC 11.9* 11.4* 10.0 10.0  NEUTROABS  --  9.4*  --   --   HGB 9.0* 8.0* 7.5* 8.2*  HCT 27.4* 25.2*  24.0* 26.0*  MCV 90.3 93.7 94.1 90.6  PLT 325.0 243 245 239   BMP &GFR Recent Labs  Lab 06/11/19 1038 06/11/19 1446 06/11/19 2334 06/12/19 0500 06/13/19 0305  NA 133* 132* 134* 137 136  K 5.7 No hemolysis seen* 5.5* 5.7* 5.6* 4.7  CL 99 101 103 103 104  CO2 23 20* 21* 22 21*  GLUCOSE 132* 197* 145* 124* 143*  BUN 62* 65* 64* 61* 61*  CREATININE 3.12* 3.17* 3.07* 3.15* 3.11*  CALCIUM 8.7 8.6* 8.3* 8.4* 8.4*  MG  --   --   --   --  1.8  PHOS  --   --   --   --  4.7*   Estimated Creatinine Clearance: 24.3 mL/min (A) (by C-G formula based on SCr of 3.11 mg/dL (H)). Liver & Pancreas: Recent Labs  Lab 06/11/19 1446 06/13/19 0305  AST 19  --   ALT 23  --   ALKPHOS 72  --   BILITOT 0.3  --   PROT 6.0*  --    ALBUMIN 2.9* 3.3*   Recent Labs  Lab 06/11/19 1446  LIPASE 20   No results for input(s): AMMONIA in the last 168 hours. Diabetic: No results for input(s): HGBA1C in the last 72 hours. No results for input(s): GLUCAP in the last 168 hours. Cardiac Enzymes: No results for input(s): CKTOTAL, CKMB, CKMBINDEX, TROPONINI in the last 168 hours. No results for input(s): PROBNP in the last 8760 hours. Coagulation Profile: Recent Labs  Lab 06/11/19 1000 06/11/19 1446 06/13/19 0305  INR 5.9* 3.9* 3.1*   Thyroid Function Tests: No results for input(s): TSH, T4TOTAL, FREET4, T3FREE, THYROIDAB in the last 72 hours. Lipid Profile: No results for input(s): CHOL, HDL, LDLCALC, TRIG, CHOLHDL, LDLDIRECT in the last 72 hours. Anemia Panel: No results for input(s): VITAMINB12, FOLATE, FERRITIN, TIBC, IRON, RETICCTPCT in the last 72 hours. Urine analysis:    Component Value Date/Time   COLORURINE YELLOW 06/11/2019 1600   APPEARANCEUR CLEAR 06/11/2019 1600   LABSPEC 1.010 06/11/2019 1600   PHURINE 6.5 06/11/2019 1600   GLUCOSEU NEGATIVE 06/11/2019 1600   GLUCOSEU > or = 1000 mg/dL (AA) 03/01/2008 1109   HGBUR SMALL (A) 06/11/2019 1600   BILIRUBINUR NEGATIVE 06/11/2019 1600   KETONESUR NEGATIVE 06/11/2019 1600   PROTEINUR NEGATIVE 06/11/2019 1600   UROBILINOGEN 0.2 mg/dL 03/01/2008 1109   NITRITE NEGATIVE 06/11/2019 1600   LEUKOCYTESUR SMALL (A) 06/11/2019 1600   Sepsis Labs: Invalid input(s): PROCALCITONIN, [place]  Microbiology: Recent Results (from the past 240 hour(s))  Urine culture     Status: Abnormal   Collection Time: 06/11/19  2:55 PM   Specimen: Urine, Catheterized  Result Value Ref Range Status   Specimen Description   Final    URINE, CATHETERIZED Performed at [facility], [address] [place], [place] [address]    Special Requests   Final    NONE Performed at [facility], [address] [place], [place] [address]    Culture 60,000  COLONIES/mL STAPHYLOCOCCUS EPIDERMIDIS (A)  Final   Report Status 06/13/2019 FINAL  Final   Organism ID, Bacteria STAPHYLOCOCCUS EPIDERMIDIS (A)  Final      Susceptibility   Staphylococcus epidermidis - MIC*    CIPROFLOXACIN >=8 RESISTANT Resistant     GENTAMICIN <=0.5 SENSITIVE Sensitive     NITROFURANTOIN <=16 SENSITIVE Sensitive     OXACILLIN >=4 RESISTANT Resistant     TETRACYCLINE 4 SENSITIVE Sensitive  VANCOMYCIN 1 SENSITIVE Sensitive     TRIMETH/SULFA 80 RESISTANT Resistant     CLINDAMYCIN >=8 RESISTANT Resistant     RIFAMPIN <=0.5 SENSITIVE Sensitive     Inducible Clindamycin NEGATIVE Sensitive     * 60,000 COLONIES/mL STAPHYLOCOCCUS EPIDERMIDIS  SARS CORONAVIRUS 2 (TAT 6-24 HRS) Nasopharyngeal Nasopharyngeal Swab     Status: None   Collection Time: 06/12/19  2:03 AM   Specimen: Nasopharyngeal Swab  Result Value Ref Range Status   SARS Coronavirus 2 NEGATIVE NEGATIVE Final    Comment: (NOTE) SARS-CoV-2 target nucleic acids are NOT DETECTED. The SARS-CoV-2 RNA is generally detectable in upper and lower respiratory specimens during the acute phase of infection. Negative results do not preclude SARS-CoV-2 infection, do not rule out co-infections with other pathogens, and should not be used as the sole basis for treatment or other patient management decisions. Negative results must be combined with clinical observations, patient history, and epidemiological information. The expected result is Negative. Fact Sheet for Patients: SugarRoll.be Fact Sheet for Healthcare Providers: https://www.woods-mathews.com/ This test is not yet approved or cleared by the [place] FDA and  has been authorized for detection and/or diagnosis of SARS-CoV-2 by FDA under an Emergency Use Authorization (EUA). This EUA will remain  in effect (meaning this test can be used) for the duration of the COVID-19 declaration under Section 56 4(b)(1) of the  Act, 21 U.S.C. section 360bbb-3(b)(1), unless the authorization is terminated or revoked sooner. Performed at [facility], [address] [place], [place] [address]     Radiology Studies: No results found.   T. [place]  If 7PM-7AM, please contact night-coverage [email or web address] Password [facility] 06/13/2019, 10:52 AM

## 2019-06-13 NOTE — Evaluation (Signed)
Physical Therapy Evaluation Patient Details Name: Sergio Pennington MRN: [id number] DOB: Dec 25, 1950 Today's Date: 06/13/2019   History of Present Illness  69 y.o. male with medical history significant of bladder cancer status post urostomy, type 2 diabetes, hypertension, hyperlipidemia, recent hospitalization for COVID 19 admitted from urology office with worsening AKI, generalized weakness and fatigue.   Clinical Impression  Pt admitted with above diagnosis.  Pt currently with functional limitations due to the deficits listed below (see PT Problem List). Pt will benefit from skilled PT to increase their independence and safety with mobility to allow discharge to the venue listed below.   Pt agreeable to ambulate in room.  Pt with untouched lunch tray and states late breakfast and no appetite.  Pt reports he is "end of life" and accepting of this however wishes to remain as independent as possible and therefore would like to continue with PT.  Pt with HHPT and [place] prior to admission.  Pt reports plan for nephrostomy tube and then possible d/c home sometime next week.       Follow Up Recommendations Home health PT    Equipment Recommendations  None recommended by PT    Recommendations for Other Services       Precautions / Restrictions Precautions Precautions: Fall Precaution Comments: urostomy Restrictions Weight Bearing Restrictions: No      Mobility  Bed Mobility Overal bed mobility: Modified Independent                Transfers Overall transfer level: Needs assistance Equipment used: Rolling walker (2 wheeled) Transfers: Sit to/from Stand Sit to Stand: Supervision         General transfer comment: for safesty. VCs hand placement  Ambulation/Gait Ambulation/Gait assistance: Min guard;Supervision Gait Distance (Feet): 22 Feet Assistive device: Rolling walker (2 wheeled) Gait Pattern/deviations: Step-through pattern;Decreased stride length      General Gait Details: pt ambulated around room, SpO2 96% on room air, no dyspnea  Stairs            Wheelchair Mobility    Modified Rankin (Stroke Patients Only)       Balance Overall balance assessment: Needs assistance   Sitting balance-Leahy Scale: Good       Standing balance-Leahy Scale: Fair                               Pertinent Vitals/Pain Pain Assessment: Faces Faces Pain Scale: Hurts a little bit Pain Intervention(s): Monitored during session;Repositioned    Home Living Family/patient expects to be discharged to:: Private residence Living Arrangements: Spouse/significant other Available Help at Discharge: Family;Available 24 hours/day Type of Home: House Home Access: Stairs to enter Entrance Stairs-Rails: Can reach both;Left;Right Entrance Stairs-Number of Steps: 8 Home Layout: One level Home Equipment: Walker - 4 wheels;Cane - single point;Shower seat;Grab bars - toilet;Grab bars - tub/shower;Shower seat - built in      Prior Function Level of Independence: Independent with assistive device(s)         Comments: has been receiving HHPT and HHOT prior to admission     Hand Dominance   Dominant Hand: Right    Extremity/Trunk Assessment   Upper Extremity Assessment Upper Extremity Assessment: Overall WFL for tasks assessed    Lower Extremity Assessment Lower Extremity Assessment: Overall WFL for tasks assessed    Cervical / Trunk Assessment Cervical / Trunk Assessment: Normal  Communication   Communication: No difficulties  Cognition Arousal/Alertness: Awake/alert Behavior During Therapy:  WFL for tasks assessed/performed Overall Cognitive Status: Within Functional Limits for tasks assessed                                        General Comments      Exercises     Assessment/Plan    PT Assessment Patient needs continued PT services  PT Problem List Decreased strength;Decreased activity  tolerance;Decreased mobility;Decreased knowledge of use of DME       PT Treatment Interventions DME instruction;Functional mobility training;Balance training;Patient/family education;Therapeutic activities;Gait training;Stair training;Therapeutic exercise    PT Goals (Current goals can be found in the Care Plan section)  Acute Rehab PT Goals Patient Stated Goal: return home, pt is aware he is at end of life PT Goal Formulation: With patient Time For Goal Achievement: 06/27/19 Potential to Achieve Goals: Good    Frequency Min 2X/week   Barriers to discharge        Co-evaluation PT/OT/SLP Co-Evaluation/Treatment: Yes Reason for Co-Treatment: For patient/therapist safety PT goals addressed during session: Mobility/safety with mobility OT goals addressed during session: ADL's and self-care       AM-PAC PT "6 Clicks" Mobility  Outcome Measure Help needed turning from your back to your side while in a flat bed without using bedrails?: None Help needed moving from lying on your back to sitting on the side of a flat bed without using bedrails?: None Help needed moving to and from a bed to a chair (including a wheelchair)?: None Help needed standing up from a chair using your arms (e.g., wheelchair or bedside chair)?: A Little Help needed to walk in hospital room?: A Little Help needed climbing 3-5 steps with a railing? : A Little 6 Click Score: 21    End of Session   Activity Tolerance: Patient tolerated treatment well Patient left: in bed;with call bell/phone within reach   PT Visit Diagnosis: Muscle weakness (generalized) (M62.81);Difficulty in walking, not elsewhere classified (R26.2)    Time: 8891-6945 PT Time Calculation (min) (ACUTE ONLY): 28 min   Charges:   PT Evaluation $PT Eval Low Complexity: 1 Low     Kati PT, DPT Acute Rehabilitation Services Office: [phone]  Trena Platt 06/13/2019, 4:22 PM

## 2019-06-13 NOTE — Progress Notes (Signed)
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 Case reviewed and approved by Dr. Vernard Gambles.   He was recently admitted for [place] infection 12/31-1/8 as well as DVT.  He has been on coumadin at home. INR was 5.9 on admission, now 3.1.   Approved for bilateral PCNs once INR <2.  Continue to hold coumadin.  NPO p MN.  Recheck INR tomorrow AM for possible procedure.   Brynda Greathouse, MS RD PA-C

## 2019-06-13 NOTE — Progress Notes (Signed)
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 Alen Blew. Bilateral positive pelvic nodes (iliac, obturator). CT this admission 05/2019 with rapidly progressive pelvic and retroperitoneal metastasis.   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 He is diabetic. Cr rise to mid 3s now over past few most with worsrening bilaterl hydro c/w recurrent malignant obstruction.   3 - End of Life / Goals of Care - pt with rapidly progressive incurable cancer. His goals are comfort and palliation. He is still desiring interventions that can increase quality of life. He would love to get home and consider home hospice when appropriate.   Today " Sergio Pennington " is stable. K down some. Imaging reviewed with rapid incrase in bilateral hydro and cancer burden. Met with Dr. Alen Blew yesterday who discussed management options, all of which are non-curative. INR today 3s.     Objective: Vital signs in last 24 hours: Temp:  [98 F (36.7 C)-99.1 F (37.3 C)] 99.1 F (37.3 C) (01/16 0539) Pulse Rate:  [70-84] 73 (01/16 0539) Resp:  [12-18] 18 (01/16 0539) BP: (150-165)/(68-80) 164/73 (01/16 0539) SpO2:  [95 %-98 %] 96 % (01/16 0539) Last BM Date: 06/12/19  Intake/Output from previous day: 01/15 0701 - 01/16 0700 In: 1150.4 [I.V.:662.1; IV Piggyback:488.2] Out: 1295 [Urine:1295] Intake/Output this shift: No intake/output data recorded.  General appearance: alert, cooperative and stigmata of advanced cancer. Progressive weight loss and muscle wasting.  Eyes: negative Nose: Nares normal. Septum midline. Mucosa normal. No drainage or sinus tenderness. Throat: lips, mucosa, and tongue normal; teeth and gums normal Neck: supple, symmetrical, trachea midline Back: symmetric, no curvature. ROM  normal. No CVA tenderness. Resp: non-labored Cardio: Nl rate GI: soft, non-tender; bowel sounds normal; no masses,  no organomegaly Male genitalia: normal Extremities: extremities normal, atraumatic, no cyanosis or edema Skin: Skin color, texture, turgor normal. No rashes or lesions Lymph nodes: Cervical, supraclavicular, and axillary nodes normal. Neurologic: Grossly normal  RLQ Urostomy pink / patent with non-foul urine in appliance.   Lab Results:  Recent Labs    06/12/19 0500 06/13/19 0305  WBC 10.0 10.0  HGB 7.5* 8.2*  HCT 24.0* 26.0*  PLT 245 239   BMET Recent Labs    06/12/19 0500 06/13/19 0305  NA 137 136  K 5.6* 4.7  CL 103 104  CO2 22 21*  GLUCOSE 124* 143*  BUN 61* 61*  CREATININE 3.15* 3.11*  CALCIUM 8.4* 8.4*   PT/INR Recent Labs    06/11/19 1446 06/13/19 0305  LABPROT 38.4* 32.0*  INR 3.9* 3.1*   ABG No results for input(s): PHART, HCO3 in the last 72 hours.  Invalid input(s): PCO2, PO2  Studies/Results: CT ABDOMEN PELVIS WO CONTRAST  Result Date: 06/11/2019 CLINICAL DATA:  Worsening renal function, recent admission for weakness and COVID-19 infection EXAM: CT ABDOMEN AND PELVIS WITHOUT CONTRAST TECHNIQUE: Multidetector CT imaging of the abdomen and pelvis was performed following the standard protocol without IV contrast. COMPARISON:  CT 05/27/2019 FINDINGS: Lower chest: Right pleural effusion, slightly increased in size from comparison exam. Adjacent atelectatic change. Subpleural regions of fibrosis are similar to prior. Reticulonodular opacities in the anterior left lower lobe as well as calcified nodularity in the right lung base are similar to prior. Hepatobiliary: Likely focal fatty infiltration near the false form ligament. Smooth hepatic surface contour. No visible or  contour deforming hepatic lesions. Layering density within the gallbladder may reflect biliary sludge. No biliary ductal dilatation or calcified gallstones. No pericholecystic  inflammation. Pancreas: Unremarkable. No pancreatic ductal dilatation or surrounding inflammatory changes. Spleen: Normal in size without focal abnormality. Adrenals/Urinary Tract: Normal adrenal gland. Redemonstration of a complex cystic lesion arising from the lower pole right kidney measuring up to 2.8 x 4.1 x 3.7 cm in size with some internal calcified septation noted. Moderate bilateral hydroureteronephrosis is slightly increased from comparison with increasing Peri ureteral stranding and some probable urothelial thickening to the level of what appears to be an ileal conduit and urostomy in the right lower quadrant. No distension of the urinary conduit system at this time. Patient is post cysto prostatectomy. Irregular soft tissue is noted in the surgical bed including in amorphous region of soft tissue attenuation measuring up to 7.4 x 2.8 cm, previously 6.8 x 2.6 cm. Stomach/Bowel: Distal esophagus, stomach and duodenum are unremarkable. No small bowel dilatation or wall thickening. Small bowel anastomosis present in the left lower quadrant adjacent the ileal conduit. Difficult to fully ascertain the configuration of the bowel in this region due to lack of intravenous contrast or enteric contrast media. Some small amount of fecalization adjacent the conduit could reflect slowed transit in this location but without resultant upstream obstruction. There is a normal appendix in the right lower quadrant. No colonic dilatation or wall thickening. Few scattered distal colonic diverticula are present. No diverticular inflammation is seen to suggest active diverticulitis Vascular/Lymphatic: Atherosclerotic calcification of the aorta and branch vessels. No aneurysm or ectasia. Retroperitoneal and periaortic adenopathy is similar to comparison. Largest nodes include a 16 mm node at the level of the bifurcation (2/51) a 14 mm right aortocaval node 2/36 and a 15 mm left periaortic node (2/42). Extensive pathologically  enlarged pelvic adenopathy is also present, largest is a multilobular nodal conglomerate in the left inguinal region measuring to 2.5 by 5.3 cm, more confluent in appearance from the comparison study and measuring only 4.2 x 2.7 cm at that time. Reproductive: Post cysto prostatectomy, as detailed above. Irregular soft tissue attenuation in the operative site of the pelvis, as detailed above. Other: Increasing circumferential body wall edema is noted. Fat containing bilateral inguinal hernias are again seen but there are postsurgical changes of the ventral midline abdomen are again noted. Overall increased volume of ascites, now moderate. Musculoskeletal: Persistent irregular sacral lucency at S3. No new suspicious osseous lesions are clearly evident. Multilevel degenerative changes are present in the imaged portions of the spine. IMPRESSION: 1. Slight interval increase in the moderate bilateral hydroureteronephrosis with increasing periureteral stranding and some probable urothelial thickening to the level of what appears to be an ileal conduit and urostomy in the right lower quadrant. Findings could reflect the result of infection or obstruction. Correlate with output and clinical symptoms. 2. Other postsurgical changes from cysto prostatectomy with increasing pelvic soft tissue nodularity and extensive retroperitoneal and pelvic adenopathy compatible with worsening metastatic disease. 3. Similar appearance of a complex cystic lesion arising from the lower pole of the right kidney. Previously characterized as Bosniak IIF. 4. Irregular lucency at S3, nonspecific. Could reflect skeletal metastasis however. 5. Worsening features of anasarca including pleural effusion, moderate ascites, circumferential body wall edema. 6. Aortic Atherosclerosis (ICD10-I70.0). Electronically Signed   By: Lovena Le M.D.   On: 06/11/2019 17:23    Anti-infectives: Anti-infectives (From admission, onward)   Start     Dose/Rate Route  Frequency Ordered Stop  06/12/19 1800  cefTRIAXone (ROCEPHIN) 1 g in sodium chloride 0.9 % 100 mL IVPB     1 g 200 mL/hr over 30 Minutes Intravenous Every 24 hours 06/12/19 0207     06/11/19 1800  cefTRIAXone (ROCEPHIN) 1 g in sodium chloride 0.9 % 100 mL IVPB     1 g 200 mL/hr over 30 Minutes Intravenous  Once 06/11/19 1747 06/11/19 1837      Assessment/Plan:  1 - Metastatic Bladder Cancer - rapidly progressive despite aggressive chemo and surgery. Prognosis poor. He udnerstands there is no curative options and life expectancy likely months.   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 He wants to proceed.   AS not emergent, rec be done when INR down some. Agree with hold coumadin. Discussed with primary team.  3 - End of Life / Goals of Care - Agree with neph tubes and possibly palliative intent chemo / immune therapy in line with his goals. Greatly appreciate medical oncology and hospitalist comanagment.   Alexis Frock 06/13/2019

## 2019-06-14 ENCOUNTER — Inpatient Hospital Stay (HOSPITAL_COMMUNITY): Payer: Medicare Other

## 2019-06-14 HISTORY — PX: IR NEPHROSTOMY PLACEMENT LEFT: IMG6063

## 2019-06-14 HISTORY — PX: IR NEPHROSTOMY PLACEMENT RIGHT: IMG6064

## 2019-06-14 LAB — RENAL FUNCTION PANEL
Albumin: 3.2 g/dL — ABNORMAL LOW (ref 3.5–5.0)
Anion gap: 15 (ref 5–15)
BUN: 67 mg/dL — ABNORMAL HIGH (ref 8–23)
CO2: 19 mmol/L — ABNORMAL LOW (ref 22–32)
Calcium: 8.6 mg/dL — ABNORMAL LOW (ref 8.9–10.3)
Chloride: 102 mmol/L (ref 98–111)
Creatinine, Ser: 4.47 mg/dL — ABNORMAL HIGH (ref 0.61–1.24)
GFR calc Af Amer: 14 mL/min — ABNORMAL LOW (ref >60)
GFR calc non Af Amer: 12 mL/min — ABNORMAL LOW (ref >60)
Glucose, Bld: 144 mg/dL — ABNORMAL HIGH (ref 70–99)
Phosphorus: 5 mg/dL — ABNORMAL HIGH (ref 2.5–4.6)
Potassium: 5.3 mmol/L — ABNORMAL HIGH (ref 3.5–5.1)
Sodium: 136 mmol/L (ref 135–145)

## 2019-06-14 LAB — COMPREHENSIVE METABOLIC PANEL
ALT: 23 U/L (ref 0–44)
AST: 21 U/L (ref 15–41)
Albumin: 3.2 g/dL — ABNORMAL LOW (ref 3.5–5.0)
Alkaline Phosphatase: 73 U/L (ref 38–126)
Anion gap: 15 (ref 5–15)
BUN: 63 mg/dL — ABNORMAL HIGH (ref 8–23)
CO2: 18 mmol/L — ABNORMAL LOW (ref 22–32)
Calcium: 8.8 mg/dL — ABNORMAL LOW (ref 8.9–10.3)
Chloride: 104 mmol/L (ref 98–111)
Creatinine, Ser: 4.31 mg/dL — ABNORMAL HIGH (ref 0.61–1.24)
GFR calc Af Amer: 15 mL/min — ABNORMAL LOW (ref >60)
GFR calc non Af Amer: 13 mL/min — ABNORMAL LOW (ref >60)
Glucose, Bld: 154 mg/dL — ABNORMAL HIGH (ref 70–99)
Potassium: 5.4 mmol/L — ABNORMAL HIGH (ref 3.5–5.1)
Sodium: 137 mmol/L (ref 135–145)
Total Bilirubin: 0.6 mg/dL (ref 0.3–1.2)
Total Protein: 6.6 g/dL (ref 6.5–8.1)

## 2019-06-14 LAB — CBC
HCT: 28.5 % — ABNORMAL LOW (ref 39.0–52.0)
Hemoglobin: 9.1 g/dL — ABNORMAL LOW (ref 13.0–17.0)
MCH: 28.7 pg (ref 26.0–34.0)
MCHC: 31.9 g/dL (ref 30.0–36.0)
MCV: 89.9 fL (ref 80.0–100.0)
Platelets: 260 10*3/uL (ref 150–400)
RBC: 3.17 MIL/uL — ABNORMAL LOW (ref 4.22–5.81)
RDW: 16.5 % — ABNORMAL HIGH (ref 11.5–15.5)
WBC: 15.7 10*3/uL — ABNORMAL HIGH (ref 4.0–10.5)
nRBC: 0 % (ref 0.0–0.2)

## 2019-06-14 LAB — MAGNESIUM: Magnesium: 1.6 mg/dL — ABNORMAL LOW (ref 1.7–2.4)

## 2019-06-14 LAB — PROTIME-INR
INR: 2.4 — ABNORMAL HIGH (ref 0.8–1.2)
Prothrombin Time: 26.1 seconds — ABNORMAL HIGH (ref 11.4–15.2)

## 2019-06-14 LAB — CK: Total CK: 20 U/L — ABNORMAL LOW (ref 49–397)

## 2019-06-14 MED ORDER — SODIUM CHLORIDE 0.9 % IV BOLUS
500.0000 mL | Freq: Once | INTRAVENOUS | Status: AC
  Administered 2019-06-14: 500 mL via INTRAVENOUS

## 2019-06-14 MED ORDER — SODIUM POLYSTYRENE SULFONATE 15 GM/60ML PO SUSP
15.0000 g | Freq: Once | ORAL | Status: AC
  Administered 2019-06-14: 15 g via ORAL
  Filled 2019-06-14: qty 60

## 2019-06-14 MED ORDER — MIDAZOLAM HCL 2 MG/2ML IJ SOLN
INTRAMUSCULAR | Status: AC | PRN
  Administered 2019-06-14 (×3): 1 mg via INTRAVENOUS

## 2019-06-14 MED ORDER — SODIUM CHLORIDE 0.9 % IV SOLN
6.0000 mg/kg | INTRAVENOUS | Status: DC
  Administered 2019-06-14: 486 mg via INTRAVENOUS
  Filled 2019-06-14 (×2): qty 9.72

## 2019-06-14 MED ORDER — FENTANYL CITRATE (PF) 100 MCG/2ML IJ SOLN
INTRAMUSCULAR | Status: AC | PRN
  Administered 2019-06-14 (×2): 50 ug via INTRAVENOUS

## 2019-06-14 MED ORDER — SODIUM CHLORIDE 0.9 % IV SOLN
2.0000 g | INTRAVENOUS | Status: DC
  Administered 2019-06-14 – 2019-06-15 (×2): 2 g via INTRAVENOUS
  Filled 2019-06-14 (×2): qty 2

## 2019-06-14 MED ORDER — ACETAMINOPHEN 10 MG/ML IV SOLN
1000.0000 mg | Freq: Once | INTRAVENOUS | Status: AC
  Administered 2019-06-14: 1000 mg via INTRAVENOUS
  Filled 2019-06-14: qty 100

## 2019-06-14 MED ORDER — MIDAZOLAM HCL 2 MG/2ML IJ SOLN
INTRAMUSCULAR | Status: AC
  Filled 2019-06-14: qty 2

## 2019-06-14 MED ORDER — LIP MEDEX EX OINT
TOPICAL_OINTMENT | CUTANEOUS | Status: AC
  Filled 2019-06-14: qty 7

## 2019-06-14 MED ORDER — LIDOCAINE HCL 1 % IJ SOLN
INTRAMUSCULAR | Status: AC
  Filled 2019-06-14: qty 40

## 2019-06-14 MED ORDER — SODIUM CHLORIDE 0.9 % IV SOLN
INTRAVENOUS | Status: AC | PRN
  Administered 2019-06-14: 10 mL/h via INTRAVENOUS

## 2019-06-14 MED ORDER — MAGNESIUM SULFATE 2 GM/50ML IV SOLN
2.0000 g | Freq: Once | INTRAVENOUS | Status: AC
  Administered 2019-06-14: 2 g via INTRAVENOUS
  Filled 2019-06-14: qty 50

## 2019-06-14 MED ORDER — FENTANYL CITRATE (PF) 100 MCG/2ML IJ SOLN
INTRAMUSCULAR | Status: AC
  Filled 2019-06-14: qty 2

## 2019-06-14 MED ORDER — ACETAMINOPHEN 325 MG PO TABS
650.0000 mg | ORAL_TABLET | Freq: Four times a day (QID) | ORAL | Status: DC | PRN
  Filled 2019-06-14: qty 2

## 2019-06-14 NOTE — Progress Notes (Signed)
Pharmacy Antibiotic Note  Sergio Pennington is a 69 y.o. male admitted on 06/11/2019 with AKI and possible UTI.   He was started on Rocephin.  Urine cx grew 60K MRSE which was felt to be contaminant.  Now he is septic several hours post PCN catheter placement. Pharmacy has been consulted for Cefepime and Dapomycin dosing. Discussed antibiotics with Dr Posey Pronto.  Proceed with plans to f/u with ID on 1/18.  06/14/2019 Tm 103.57F WBC increasing- 15.7 Scr seems to have plateaued- 4.31 CK 20 Repeat blood cultures drawn  Plan: Cefepime 2gm IV q24h  Daptomycin 6mg /kg IV q48h Monitor renal function and cx data    Height: 6' (182.9 cm) Weight: 178 lb 9.2 oz (81 kg) IBW/kg (Calculated) : 77.6  Temp (24hrs), Avg:100.1 F (37.8 C), Min:97.6 F (36.4 C), Max:103.1 F (39.5 C)  Recent Labs  Lab 06/11/19 1038 06/11/19 1038 06/11/19 1446 06/11/19 1446 06/11/19 2334 06/12/19 0500 06/13/19 0305 06/14/19 0650 06/14/19 1549  WBC 11.9*  --  11.4*  --   --  10.0 10.0 15.7*  --   CREATININE 3.12*   < > 3.17*   < > 3.07* 3.15* 3.11* 4.47* 4.31*   < > = values in this interval not displayed.    Estimated Creatinine Clearance: 17.5 mL/min (A) (by C-G formula based on SCr of 4.31 mg/dL (H)).    Allergies  Allergen Reactions  . Sulfa Antibiotics Hives    Antimicrobials this admission: 1/14 Rocephin >> 1/17 1/17 Cefepime >>  1/17 Daptomycin>>  Dose adjustments this admission:  Microbiology results: 1/17 BCx:  1/14 UCx: 60K MRSE   Thank you for allowing pharmacy to be a part of this patient's care.  Netta Cedars, PharmD, BCPS 06/14/2019 5:27 PM

## 2019-06-14 NOTE — Consult Note (Signed)
Chief Complaint: Patient was seen in consultation today for malignant ureteral obstruction  Referring Physician(s): Dr. Tresa Moore  Supervising Physician: Arne Cleveland  Patient Status: [facility] - In-pt  History of Present Illness: Sergio Pennington is a 69 y.o. male with past medical history of DM, HLD, RBBB, and advanced bladder cancer who was recently admitted to [facility] with COVID19 infection and subsequent DVT placed on coumadin at home.  Patient now readmitted with fatigue, weakness, and worsening abdominal pain.  Imaging showed worsening pelvic and retroperitoneal metastasis with partial malignant obstruction of bilateral ureteral resulting in bilateral hydronephrosis.  IR consulted for bilateral percutaneous nephrostomy tube placements.  Coumadin has been held. His INR was trended down from 5.9  3.1  2.4 today.   Discussed case with Dr. Vernard Gambles who approves patient for procedure.    Past Medical History:  Diagnosis Date  . Bladder cancer ([place])   . Cellulitis    Left leg   . Constipation   . Diabetes mellitus without complication ([place])   . Diabetic neuropathy ([place])    Feet  . Hematuria 04/21/2018  . Hyperlipidemia   . 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 Vasculitis ([place])    Left leg    Past Surgical History:  Procedure Laterality Date  . CYSTOSCOPY WITH INJECTION N/A 11/26/2018   Procedure: CYSTOSCOPY WITH INJECTION OF INDOCYANINE GREEN DYE;  Surgeon: Alexis Frock, MD;  Location: [facility]S;  Service: Urology;  Laterality: N/A;  . IR IMAGING GUIDED PORT INSERTION  06/26/2018  . MOUTH SURGERY  1980  . TONSILLECTOMY     4th grade  . TRANSURETHRAL RESECTION OF BLADDER TUMOR N/A 06/16/2018   Procedure: TRANSURETHRAL RESECTION OF BLADDER TUMOR (TURBT);  Surgeon: Lucas Mallow, MD;  Location: [facility];  Service: Urology;  Laterality: N/A;    Allergies: Sulfa antibiotics  Medications: Prior to Admission medications     Medication Sig Start Date End Date Taking? Authorizing Provider  acetaminophen (TYLENOL) 500 MG tablet Take 1,000 mg by mouth every 6 (six) hours.    Yes [provider]  ascorbic acid (VITAMIN C) 500 MG tablet Take 1 tablet (500 mg total) by mouth 2 (two) times daily for 14 days. 06/05/19 06/19/19 Yes Arma Heading, MD  Cholecalciferol (VITAMIN D3) 125 MCG (5000 UT) CAPS Take 5,000 Units by mouth daily.   Yes [provider]  Cyanocobalamin (VITAMIN B-12 PO) Take 4,000 mcg by mouth daily.    Yes [provider]  magnesium oxide (MAG-OX) 400 MG tablet TAKE 1 TABLET BY MOUTH EVERY DAY Patient taking differently: Take 400 mg by mouth daily.  01/06/19  Yes Wyatt Portela, MD  ondansetron (ZOFRAN ODT) 4 MG disintegrating tablet 4mg  ODT q4 hours prn nausea/vomit Patient taking differently: Take 4 mg by mouth every 4 (four) hours as needed for nausea or vomiting.  05/12/19  Yes Deno Etienne, DO  patiromer (VELTASSA) 8.4 g packet Take 1 packet (8.4 g total) by mouth daily for 15 days. 06/06/19 06/21/19 Yes Arma Heading, MD  polyethylene glycol powder [facility]) 17 GM/SCOOP powder Take 1 Container by mouth daily.    Yes [provider]  Probiotic Product (PROBIOTIC DAILY PO) Take 1 capsule by mouth daily.   Yes [provider]  prochlorperazine (COMPAZINE) 10 MG tablet TAKE 1 TABLET BY MOUTH EVERY 6 HOURS AS NEEDED FOR NAUSEA OR VOMITING. Patient taking differently: Take 10 mg by mouth every 6 (six) hours  as needed for nausea or vomiting.  11/04/18  Yes Wyatt Portela, MD  senna-docusate (SENNA S) 8.6-50 MG tablet Take 1 tablet by mouth 2 (two) times daily. Patient taking differently: Take 1 tablet by mouth daily as needed for mild constipation.  10/08/18  Yes Wyatt Portela, MD  Zinc 30 MG TABS Take 1 tablet by mouth daily.    Yes [provider]  warfarin (COUMADIN) 5 MG tablet Take 1 tablet (5 mg total) by mouth daily. 06/05/19 06/04/20  Arma Heading, MD      History reviewed. No pertinent family history.  Social History   Socioeconomic History  . Marital status: Married    Spouse name: Not on file  . Number of children: Not on file  . Years of education: Not on file  . Highest education level: Not on file  Occupational History  . Not on file  Tobacco Use  . Smoking status: Former Research scientist (life sciences)  . Smokeless tobacco: Never Used  Substance and Sexual Activity  . Alcohol use: Not Currently    Alcohol/week: 1.0 standard drinks    Types: 1 Glasses of wine per week  . Drug use: Not Currently  . Sexual activity: Not on file  Other Topics Concern  . Not on file  Social History Narrative  . Not on file   Social Determinants of Health   Financial Resource Strain:   . Difficulty of Paying Living Expenses: Not on file  Food Insecurity:   . Worried About Charity fundraiser in the Last Year: Not on file  . Ran Out of Food in the Last Year: Not on file  Transportation Needs:   . Lack of Transportation (Medical): Not on file  . Lack of Transportation (Non-Medical): Not on file  Physical Activity:   . Days of Exercise per Week: Not on file  . Minutes of Exercise per Session: Not on file  Stress:   . Feeling of Stress : Not on file  Social Connections:   . Frequency of Communication with Friends and Family: Not on file  . Frequency of Social Gatherings with Friends and Family: Not on file  . Attends Religious Services: Not on file  . Active Member of Clubs or Organizations: Not on file  . Attends Archivist Meetings: Not on file  . Marital Status: Not on file     Review of Systems: A 12 point ROS discussed and pertinent positives are indicated in the HPI above.  All other systems are negative.  Review of Systems  Constitutional: Positive for fatigue. Negative for fever.  Respiratory: Negative for cough and shortness of breath.   Cardiovascular: Negative for chest pain.  Gastrointestinal: Negative for abdominal pain.   Genitourinary: Positive for difficulty urinating.  Musculoskeletal: Negative for back pain.  Neurological: Positive for weakness.  Psychiatric/Behavioral: Negative for behavioral problems and confusion.    Vital Signs: BP (!) 156/68 (BP Location: Right Arm)   Pulse 84   Temp 99.3 F (37.4 C) (Oral)   Resp 20   Ht 6' (1.829 m)   Wt 178 lb 9.2 oz (81 kg)   SpO2 90%   BMI 24.22 kg/m   Physical Exam Vitals and nursing note reviewed.  Constitutional:      General: He is not in acute distress.    Appearance: He is not ill-appearing.  HENT:     Mouth/Throat:     Mouth: Mucous membranes are moist.     Pharynx: Oropharynx is clear.  Cardiovascular:     Rate and Rhythm: Normal rate and regular rhythm.  Pulmonary:     Effort: Pulmonary effort is normal. No respiratory distress.     Breath sounds: Normal breath sounds.  Abdominal:     General: There is no distension.     Palpations: Abdomen is soft.  Skin:    General: Skin is warm and dry.  Neurological:     General: No focal deficit present.     Mental Status: He is alert and oriented to person, place, and time. Mental status is at baseline.  Psychiatric:        Mood and Affect: Mood normal.        Thought Content: Thought content normal.        Judgment: Judgment normal.      MD Evaluation Airway: WNL Heart: WNL Abdomen: WNL Chest/ Lungs: WNL ASA  Classification: 3 Mallampati/Airway Score: Two   Imaging: CT ABDOMEN PELVIS WO CONTRAST  Result Date: 06/11/2019 CLINICAL DATA:  Worsening renal function, recent admission for weakness and COVID-19 infection EXAM: CT ABDOMEN AND PELVIS WITHOUT CONTRAST TECHNIQUE: Multidetector CT imaging of the abdomen and pelvis was performed following the standard protocol without IV contrast. COMPARISON:  CT 05/27/2019 FINDINGS: Lower chest: Right pleural effusion, slightly increased in size from comparison exam. Adjacent atelectatic change. Subpleural regions of fibrosis are similar  to prior. Reticulonodular opacities in the anterior left lower lobe as well as calcified nodularity in the right lung base are similar to prior. Hepatobiliary: Likely focal fatty infiltration near the false form ligament. Smooth hepatic surface contour. No visible or contour deforming hepatic lesions. Layering density within the gallbladder may reflect biliary sludge. No biliary ductal dilatation or calcified gallstones. No pericholecystic inflammation. Pancreas: Unremarkable. No pancreatic ductal dilatation or surrounding inflammatory changes. Spleen: Normal in size without focal abnormality. Adrenals/Urinary Tract: Normal adrenal gland. Redemonstration of a complex cystic lesion arising from the lower pole right kidney measuring up to 2.8 x 4.1 x 3.7 cm in size with some internal calcified septation noted. Moderate bilateral hydroureteronephrosis is slightly increased from comparison with increasing Peri ureteral stranding and some probable urothelial thickening to the level of what appears to be an ileal conduit and urostomy in the right lower quadrant. No distension of the urinary conduit system at this time. Patient is post cysto prostatectomy. Irregular soft tissue is noted in the surgical bed including in amorphous region of soft tissue attenuation measuring up to 7.4 x 2.8 cm, previously 6.8 x 2.6 cm. Stomach/Bowel: Distal esophagus, stomach and duodenum are unremarkable. No small bowel dilatation or wall thickening. Small bowel anastomosis present in the left lower quadrant adjacent the ileal conduit. Difficult to fully ascertain the configuration of the bowel in this region due to lack of intravenous contrast or enteric contrast media. Some small amount of fecalization adjacent the conduit could reflect slowed transit in this location but without resultant upstream obstruction. There is a normal appendix in the right lower quadrant. No colonic dilatation or wall thickening. Few scattered distal colonic  diverticula are present. No diverticular inflammation is seen to suggest active diverticulitis Vascular/Lymphatic: Atherosclerotic calcification of the aorta and branch vessels. No aneurysm or ectasia. Retroperitoneal and periaortic adenopathy is similar to comparison. Largest nodes include a 16 mm node at the level of the bifurcation (2/51) a 14 mm right aortocaval node 2/36 and a 15 mm left periaortic node (2/42). Extensive pathologically enlarged pelvic adenopathy is also present, largest is a multilobular nodal conglomerate in the left  inguinal region measuring to 2.5 by 5.3 cm, more confluent in appearance from the comparison study and measuring only 4.2 x 2.7 cm at that time. Reproductive: Post cysto prostatectomy, as detailed above. Irregular soft tissue attenuation in the operative site of the pelvis, as detailed above. Other: Increasing circumferential body wall edema is noted. Fat containing bilateral inguinal hernias are again seen but there are postsurgical changes of the ventral midline abdomen are again noted. Overall increased volume of ascites, now moderate. Musculoskeletal: Persistent irregular sacral lucency at S3. No new suspicious osseous lesions are clearly evident. Multilevel degenerative changes are present in the imaged portions of the spine. IMPRESSION: 1. Slight interval increase in the moderate bilateral hydroureteronephrosis with increasing periureteral stranding and some probable urothelial thickening to the level of what appears to be an ileal conduit and urostomy in the right lower quadrant. Findings could reflect the result of infection or obstruction. Correlate with output and clinical symptoms. 2. Other postsurgical changes from cysto prostatectomy with increasing pelvic soft tissue nodularity and extensive retroperitoneal and pelvic adenopathy compatible with worsening metastatic disease. 3. Similar appearance of a complex cystic lesion arising from the lower pole of the right  kidney. Previously characterized as Bosniak IIF. 4. Irregular lucency at S3, nonspecific. Could reflect skeletal metastasis however. 5. Worsening features of anasarca including pleural effusion, moderate ascites, circumferential body wall edema. 6. Aortic Atherosclerosis (ICD10-I70.0). Electronically Signed   By: Lovena Le M.D.   On: 06/11/2019 17:23   CT ABDOMEN PELVIS WO CONTRAST  Result Date: 05/27/2019 CLINICAL DATA:  Diarrhea nausea and vomiting history of bladder cancer status post urostomy EXAM: CT ABDOMEN AND PELVIS WITHOUT CONTRAST TECHNIQUE: Multidetector CT imaging of the abdomen and pelvis was performed following the standard protocol without IV contrast. COMPARISON:  05/12/2019, 10/23/2018 FINDINGS: Lower chest: Lung bases demonstrate new small right-sided pleural effusion. Scattered areas of subpleural fibrosis. Punctate and nodular calcifications at the right lung base as before. Heart size is normal. Coronary vascular calcification. Hepatobiliary: Probable focal fat infiltration near falciform ligament. No calcified gallstone or biliary dilatation Pancreas: Unremarkable. No pancreatic ductal dilatation or surrounding inflammatory changes. Spleen: Normal in size without focal abnormality. Adrenals/Urinary Tract: Adrenal glands are normal. Complex cystic lesion exophytic to the lower pole right kidney with scattered calcifications, measures up to 5.1 cm. Mild to moderate bilateral hydronephrosis and hydroureter appears slightly worse on the right side compared to most recent prior CT. Hydroureter visualized to ill-defined soft tissue density in the right lower quadrant of the abdomen with surgical suture. Stomach/Bowel: The stomach is nonenlarged. There is no dilated small bowel. Right lower quadrant ostomy. Negative appendix. No colon wall thickening. Vascular/Lymphatic: Nonaneurysmal aorta. Aortic atherosclerosis, moderate. Continued retroperitoneal adenopathy. Left external iliac  mass/adenopathy measuring 3.6 cm, grossly unchanged. Pelvic sidewall adenopathy measuring up to 18 mm on the left. Reproductive: Prostate surgically absent. Other: No free air. Small amount of ascites adjacent to the spleen and liver. Ill-defined soft tissue mass in the left lower quadrant/upper pelvis measuring 3.3 x 2.1 cm concerning for metastatic deposit. Irregular soft tissue density within the pelvis measuring approximately 6.8 x 1.6 cm, grossly similar compared to prior. Ill-defined soft tissue thickening and nodularity within the pelvis. Small amount of fluid and ill-defined soft tissue density in the right gutter. Multiple nodules or nodes in the lower abdomen and pelvis. Musculoskeletal: No new abnormality since 05/12/2019. Mild irregular lucency at the S3 segment of the sacrum. IMPRESSION: 1. Status post bladder resection with right lower quadrant urostomy. Mild to  moderate bilateral hydronephrosis, grossly stable on the left and slightly worsened on the right since 05/12/2019. 2. New small right pleural effusion and new small amount of perihepatic and perisplenic ascites. 3. Grossly stable retroperitoneal, pelvic sidewall, and iliac adenopathy consistent with metastatic disease. Numerous pelvic nodules. Slight increased fluid and nodularity in the right gutter with persistent ill-defined soft tissue mass in the left pelvis, all consistent with metastatic disease. Grossly stable lobulated soft tissue density in the pelvis concerning for residual or recurrent disease. Irregular soft tissue density in the right lower quadrant with associated surgical sutures presumably related to history of urostomy surgery, though with slightly masslike features; additional metastatic disease cannot be excluded. Electronically Signed   By: Donavan Foil M.D.   On: 05/27/2019 23:04   XR Chest Single View  Result Date: 05/27/2019 CLINICAL DATA:  Weakness, nausea, vomiting, diarrhea EXAM: PORTABLE CHEST 1 VIEW  COMPARISON:  05/12/2019 FINDINGS: There is a right-sided Port-A-Cath. There is right basilar atelectasis. There is no focal consolidation. There is no pleural effusion or pneumothorax. The heart and mediastinal contours are unremarkable. There is no acute osseous abnormality. IMPRESSION: Mild right basilar atelectasis. Otherwise no acute cardiopulmonary disease. Electronically Signed   By: Kathreen Devoid   On: 05/27/2019 19:49   VAS [place] LOWER EXTREMITY VENOUS (DVT)  Result Date: 05/28/2019  Lower Venous Study Indications: Elevated Ddimer.  Risk Factors: COVID 19 positive Cancer. Anticoagulation: Heparin. Limitations: Bowel gas. Comparison Study: No prior studies. Performing Technologist: Oliver Hum RVT  Examination Guidelines: A complete evaluation includes B-mode imaging, spectral Doppler, color Doppler, and power Doppler as needed of all accessible portions of each vessel. Bilateral testing is considered an integral part of a complete examination. Limited examinations for reoccurring indications may be performed as noted.  +---------+---------------+---------+-----------+----------+--------------+ RIGHT    CompressibilityPhasicitySpontaneityPropertiesThrombus Aging +---------+---------------+---------+-----------+----------+--------------+ CFV      Partial        No       No                   Acute          +---------+---------------+---------+-----------+----------+--------------+ SFJ      Full                                                        +---------+---------------+---------+-----------+----------+--------------+ FV Prox  Partial        No       No                   Acute          +---------+---------------+---------+-----------+----------+--------------+ FV Mid   Partial        No       No                   Acute          +---------+---------------+---------+-----------+----------+--------------+ FV DistalPartial        No       No                   Acute           +---------+---------------+---------+-----------+----------+--------------+ PFV  Not visualized +---------+---------------+---------+-----------+----------+--------------+ POP      Full           No       No                                  +---------+---------------+---------+-----------+----------+--------------+ PTV      Full                                                        +---------+---------------+---------+-----------+----------+--------------+ PERO     Full                                                        +---------+---------------+---------+-----------+----------+--------------+ EIV      Full           No       No                                  +---------+---------------+---------+-----------+----------+--------------+ CIV                                                   Not visualized +---------+---------------+---------+-----------+----------+--------------+   +---------+---------------+---------+-----------+----------+--------------+ LEFT     CompressibilityPhasicitySpontaneityPropertiesThrombus Aging +---------+---------------+---------+-----------+----------+--------------+ CFV      Partial        No       No                   Acute          +---------+---------------+---------+-----------+----------+--------------+ SFJ      Full                                                        +---------+---------------+---------+-----------+----------+--------------+ FV Prox  Full                                                        +---------+---------------+---------+-----------+----------+--------------+ FV Mid   Partial        No       No                   Acute          +---------+---------------+---------+-----------+----------+--------------+ FV DistalPartial        No       No                   Acute           +---------+---------------+---------+-----------+----------+--------------+ PFV  Not visualized +---------+---------------+---------+-----------+----------+--------------+ POP      Partial        No       No                   Acute          +---------+---------------+---------+-----------+----------+--------------+ PTV      Partial                                      Acute          +---------+---------------+---------+-----------+----------+--------------+ PERO     Full                                                        +---------+---------------+---------+-----------+----------+--------------+ Gastroc  None                                         Acute          +---------+---------------+---------+-----------+----------+--------------+ SSV      Full                                                        +---------+---------------+---------+-----------+----------+--------------+ EIV      Full           No       No                                  +---------+---------------+---------+-----------+----------+--------------+ CIV                                                   Not visualized +---------+---------------+---------+-----------+----------+--------------+    Summary: Right: Findings consistent with acute deep vein thrombosis involving the right common femoral vein, and right femoral vein. No cystic structure found in the popliteal fossa. The external iliac vein appears patent. Left: Findings consistent with acute deep vein thrombosis involving the left common femoral vein, left femoral vein, left popliteal vein, left posterior tibial veins, and left gastrocnemius veins. No cystic structure found in the popliteal fossa. The external iliac vein appears patent.  *See table(s) above for measurements and observations. Electronically signed by Deitra Mayo MD on 05/28/2019 at 1:28:50 PM.    Final       Labs:  CBC: Recent Labs    06/11/19 1446 06/12/19 0500 06/13/19 0305 06/14/19 0650  WBC 11.4* 10.0 10.0 15.7*  HGB 8.0* 7.5* 8.2* 9.1*  HCT 25.2* 24.0* 26.0* 28.5*  PLT 243 245 239 260    COAGS: Recent Labs    06/26/18 1208 06/02/19 1143 06/11/19 1000 06/11/19 1446 06/13/19 0305 06/14/19 0650  INR 1.02   < > 5.9* 3.9* 3.1* 2.4*  APTT 32  --   --   --   --   --    < > =  values in this interval not displayed.    BMP: Recent Labs    06/11/19 2334 06/12/19 0500 06/13/19 0305 06/14/19 0650  NA 134* 137 136 136  K 5.7* 5.6* 4.7 5.3*  CL 103 103 104 102  CO2 21* 22 21* 19*  GLUCOSE 145* 124* 143* 144*  BUN 64* 61* 61* 67*  CALCIUM 8.3* 8.4* 8.4* 8.6*  CREATININE 3.07* 3.15* 3.11* 4.47*  GFRNONAA 20* 19* 19* 12*  GFRAA 23* 22* 22* 14*    LIVER FUNCTION TESTS: Recent Labs    05/05/19 1016 05/05/19 1016 05/11/19 1843 05/11/19 1843 05/27/19 1923 06/11/19 1446 06/13/19 0305 06/14/19 0650  BILITOT 0.3  --  0.6  --  0.5 0.3  --   --   AST 17  --  20  --  19 19  --   --   ALT 15  --  19  --  22 23  --   --   ALKPHOS 84  --  76  --  72 72  --   --   PROT 7.1  --  8.0  --  6.4* 6.0*  --   --   ALBUMIN 3.6   < > 4.0   < > 2.8* 2.9* 3.3* 3.2*   < > = values in this interval not displayed.    TUMOR MARKERS: No results for input(s): AFPTM, CEA, CA199, CHROMGRNA in the last 8760 hours.  Assessment and Plan: Metastatic Bladder Cancer  Patient with obstructive pathology resulting in bilateral hydronephrosis and worsening renal function.  SCr 3.07  4.47.  On coumadin at home, but with INR trending down.  Latest value 2.4 this AM.   Discussed case with Dr. Vernard Gambles.  Ok to proceed with bilateral nephrostomy tube placements today in IR.  Patient has been NPO.  Not on blood thinners.   Discussed procedure with patient who is agreeable.   Risks and benefits of bilateral PCN placement was discussed with the patient including, but not limited to, infection,  bleeding, significant bleeding causing loss or decrease in renal function or damage to adjacent structures.   All of the patient's questions were answered, patient is agreeable to proceed.  Consent signed and in chart.  Thank you for this interesting consult.  I greatly enjoyed meeting DEVINN HURWITZ and look forward to participating in their care.  A copy of this report was sent to the requesting provider on this date.  Electronically Signed: Docia Barrier, PA 06/14/2019, 1:12 PM   I spent a total of 40 Minutes    in face to face in clinical consultation, greater than 50% of which was counseling/coordinating care for bilateral hydronephrosis, metastatic bladder cancer.

## 2019-06-14 NOTE — Progress Notes (Addendum)
   06/14/19 1545 06/14/19 1552 06/14/19 1556  MEWS Score  Temp  --  (!) 102.1 F (38.9 C) (oral) 98.8 F (37.1 C)  BP (!) 190/100 (!) 185/92 (!) 174/79  Pulse Rate (!) 124 (!) 118 (!) 106  Resp  --   --  20  SpO2 96 % 95 % 91 %  O2 Device  --   --  Room Air  MEWS Score  MEWS Temp 0  --  0  MEWS Systolic 0  --  0  MEWS Pulse 2  --  1  MEWS RR 0  --  0  MEWS LOC 0  --  0  MEWS Score 2  --  1  MEWS Score Color Yellow  --  Green  MEWS Assessment  Is this an acute change? Yes  --   --   Provider Notification  Provider Name/Title Posey Pronto  --   --   Date Provider Notified 06/14/19  --   --   Time Provider Notified [phone]  --   --   Notification Type Page  --   --   Notification Reason Change in status  --   --   Response See new orders;Other (Comment) (MD to bedside)  --   --   Date of Provider Response 06/14/19  --   --   Time of Provider Response 1548  --   --     06/14/19 1605  MEWS Score  Temp (!) 103.1 F (39.5 C) (rectal)  BP (!) 160/79  Pulse Rate (!) 101  Resp  --   SpO2 90 %  O2 Device  --   MEWS Score  MEWS Temp 2  MEWS Systolic 0  MEWS Pulse 1  MEWS RR 0  MEWS LOC 0  MEWS Score 3  MEWS Score Color Yellow  MEWS Assessment  Is this an acute change?  --   Provider Notification  Provider Name/Title  --   Date Provider Notified  --   Time Provider Notified  --   Notification Type  --   Notification Reason  --   Response  --   Date of Provider Response  --   Time of Provider Response  --   Telemerty notified RN that patients HR was sustained in the 140s. Patient stated he felt very cold and was shivering. RN immediately checked patients vitals and updated MD with change in status. Agricultural consultant and MD reported to bedside. Pt found to have temperature of 103 rectally.  Will carry out new orders and continue to monitor patient.

## 2019-06-14 NOTE — Progress Notes (Signed)
Palliative Care Consult Received.  Plan for meeting with Sergio Pennington tomorrow at 1000.  Micheline Rough, MD [facility] [phone]  NO CHARGE NOTE

## 2019-06-14 NOTE — Progress Notes (Addendum)
   06/14/19 1407  MEWS Score  BP (!) 161/87  Pulse Rate 79  Resp 20  Level of Consciousness Alert  SpO2 95 %  O2 Device Room Air  MEWS Score  MEWS Temp 0  MEWS Systolic 0  MEWS Pulse 0  MEWS RR 0  MEWS LOC 0  MEWS Score 0  MEWS Score Color Green  Pt back from IR, states he "feels fine" and just wants to rest. Vitals stable. Will continue to monitor.

## 2019-06-14 NOTE — Progress Notes (Signed)
Brief update note Called by RN patient suddenly developed severe rigors, tachycardia with heart rate sustained in the 140s and however after returning from nephrostomy tube placement.  But says patient at bedside.  Tachycardic with heart rate in the 130s, hypertensive with systolic in the 341P.  Initial oral temperature normal with subsequent rectal temperature of 103.  While he was trying to swallow Tylenol pills, he had an episode of green-colored nonbloody vomitus.  After the vomitus, his vitals improved and he felt better.  We will get blood cultures, CMP.  Broaden antibiotic spectrum to include methicillin-resistant staph epidermidis seen on urine culture-daptomycin (unable to use vancomycin due to poor renal function), cefepime.  IV Tylenol ordered.  Budd Palmer, MD Internal Medicine  Hospitalist

## 2019-06-14 NOTE — Progress Notes (Signed)
PROGRESS NOTE  Sergio Pennington ZOX:[id number] DOB: Jan 24, 1951   PCP: Darreld Mclean, MD  Patient is from: Home.  DOA: 06/11/2019 LOS: 3  Brief Narrative / Interim history: 69 year old male with history of bladder cancer with possible mets to pelvic and retroperitoneal lymph nodes, s/p cystoprostatectomy/ileal conduit and urostomy by Dr. Tresa Moore in 11/2018, DM-2, HTN, recent hospitalization from 12/30-1/8 for [id number] infection complicated by DVT now on warfarin sent to ED from urology office for worsening AKI, abdominal pain, weakness and fatigue.  In ED, hemodynamically stable.  On room air.  NA 133.  K5.5. Cr 3.17 (baseline 2.3-2.4).  BUN 62.  WBC 11.4.  Hgb 8.0 otherwise, CBC and CMP without significant finding.  PT/INR 38.4/3.9.  CT abdomen and pelvis with worsening moderate bilateral hydronephrosis, increased periureteral stranding and probable urothelial thickening to the level of ileal conduit, increasing pelvic soft tissue nodularity and extensive retroperitoneal and pelvic adenopathy compatible with worsening metastasis, stable complex cystic lesion of right kidney and irregular lucency at L3, and worsening anasarca including pleural effusion and moderate ascites.  Admitted for AKI, hyperkalemia and possible complicated UTI.  Cultures obtained.  Started on ceftriaxone.  Urology and oncology consulted and following.   Subjective: N.p.o. for nephrostomy tube today.  Wants to drink water.  Does not have any new complaints.  He would like to go home as soon as possible.  He is open to option of home with hospice and would like to speak with palliative care team.  Objective: Vitals:   06/12/19 2200 06/13/19 0539 06/13/19 2009 06/14/19 0515  BP: (!) 165/73 (!) 164/73 (!) 167/79 (!) 168/83  Pulse: 76 73 90 98  Resp:  18 20 20   Temp:  99.1 F (37.3 C) 97.6 F (36.4 C) 99.4 F (37.4 C)  TempSrc:      SpO2: 98% 96% 100% 93%  Weight:      Height:        Intake/Output  Summary (Last 24 hours) at 06/14/2019 0725 Last data filed at 06/14/2019 0516 Gross per 24 hour  Intake 800 ml  Output 820 ml  Net -20 ml   Filed Weights   06/11/19 1434  Weight: 81 kg    Examination:  GENERAL: No apparent distress.  Nontoxic. HEENT: MMM.  Vision and hearing grossly intact.  NECK: Supple.  No apparent JVD.  RESP:  No IWOB. Good air movement bilaterally. CVS:  RRR. Heart sounds normal.  ABD/GI/GU: Bowel sounds present. Soft. Non tender.  Ileostomy over RLQ.  Clear urine in foley bag MSK/EXT:  Moves extremities. No apparent deformity.  1-2 pitting pedal edema bilaterally SKIN: no apparent skin lesion or wound NEURO: Awake, alert and oriented appropriately.  No apparent focal neuro deficit. PSYCH: Calm. Normal affect.  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  Worsening renal function with creatinine up to 4.47, potassium 5.3. He does not appear to be on nephrotoxic meds.  No significant improvement with IV fluid.  Likely obstructive given worsening bilateral hydronephrosis with increasing periureteral stranding in setting of complex postoperative anatomy. -IV fluids were discontinued given worsening edema and no improvement in renal function -Avoid nephrotoxic meds -S/p nephrostomy tube placement today by IR -Follow-up BMP tomorrow  Complicated urinary tract infection in a patient with ileal conduit and urostomy: CT abdomen and pelvis with  increased periureteral stranding and probable urothelial thickening to the level of ileal conduit.  No history of MDR or ESBL.  Urine culture with 60,000 methicillin-resistant staph epidermidis.   -Continue ceftriaxone starting 1/14.  Plan to discharge on doxycycline p.o. 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 Tresa Moore, urology and Dr. Alen Blew, oncology -Management per urology and oncology-both consulted. -Palliative care consulted.  Anasarca: Multifactorial including anemia, hypoalbuminemia and malignancy.  Holding IV fluids.  Symptomatic mixed iron deficiency anemia and anemia of chronic disease: Baseline Hgb 8-9.  Anemia panel on 12/31 concerning with iron deficiency but patient was is COVID-19 which makes it difficult to interpret. Currently at baseline  Hyperkalemia: Likely due to renal failure.  -Repeat potassium later today after nephrostomy tube placement.  If uptrending, will shift.  Avoiding oral potassium excreting agents given complex GI surgical anatomy.  COVID-19 infection: Tested positive on 05/27/2019.  Hospitalized 12/30-1/8.  Completed treatment course.  No respiratory symptoms.  COVID-19 PCR negative.   -Continue airborne and contact precautions until 06/18/2019 despite negative result.  DVT: Recently diagnosed.  INR therapeutic at 2.4 -Hold warfarin for PCN.  Restart Coumadin after nephrostomy tube placement.  Goal of care discussion:  Code status changed to DNR/DNI by previous provider.    DVT prophylaxis: INR supratherapeutic.  Warfarin once INR is therapeutic. Code Status: DNR/DNI Family Communication:  Disposition Plan: Remains inpatient for AKI, complicated UTI and hyperkalemia.  Tentative plan for discharge home with home hospice when stable Consultants: Urology, oncology   Microbiology summarized: 12/30-POC COVID-19 positive. 1/15-COVID-19 negative. 1/15-urine culture grew staph epidermidis.  Sch Meds:  Scheduled Meds: . sodium chloride   Intravenous Once  . Chlorhexidine Gluconate Cloth  6 each Topical Daily  . sodium chloride flush  10-40 mL Intracatheter Q12H   Continuous Infusions: . sodium chloride 10 mL/hr at 06/13/19 1722  . cefTRIAXone (ROCEPHIN)  IV 1 g (06/13/19 1720)   PRN Meds:.sodium chloride, ondansetron **OR**  ondansetron (ZOFRAN) IV, sodium chloride flush  Antimicrobials: Anti-infectives (From admission, onward)   Start     Dose/Rate Route Frequency Ordered Stop   06/12/19 1800  cefTRIAXone (ROCEPHIN) 1 g in sodium chloride 0.9 % 100 mL IVPB     1 g 200 mL/hr over 30 Minutes Intravenous Every 24 hours 06/12/19 0207     06/11/19 1800  cefTRIAXone (ROCEPHIN) 1 g in sodium chloride 0.9 % 100 mL IVPB     1 g 200 mL/hr over 30 Minutes Intravenous  Once 06/11/19 1747 06/11/19 1837       I have personally reviewed the following labs and images: CBC: Recent Labs  Lab 06/11/19 1038 06/11/19 1446 06/12/19 0500 06/13/19 0305  WBC 11.9* 11.4* 10.0 10.0  NEUTROABS  --  9.4*  --   --   HGB 9.0* 8.0* 7.5* 8.2*  HCT 27.4* 25.2* 24.0* 26.0*  MCV 90.3 93.7 94.1 90.6  PLT 325.0 243 245 239   BMP &GFR Recent Labs  Lab 06/11/19 1446 06/11/19 2334 06/12/19 0500 06/13/19 0305 06/14/19 0650  NA 132* 134* 137 136 136  K 5.5* 5.7* 5.6* 4.7 5.3*  CL 101 103 103 104 102  CO2 20* 21* 22 21* 19*  GLUCOSE 197* 145* 124* 143* 144*  BUN 65* 64* 61* 61* 67*  CREATININE 3.17* 3.07* 3.15* 3.11* 4.47*  CALCIUM 8.6* 8.3* 8.4* 8.4* 8.6*  MG  --   --   --  1.8 1.6*  PHOS  --   --   --  4.7* 5.0*  Estimated Creatinine Clearance: 24.3 mL/min (A) (by C-G formula based on SCr of 3.11 mg/dL (H)). Liver & Pancreas: Recent Labs  Lab 06/11/19 1446 06/13/19 0305  AST 19  --   ALT 23  --   ALKPHOS 72  --   BILITOT 0.3  --   PROT 6.0*  --   ALBUMIN 2.9* 3.3*   Recent Labs  Lab 06/11/19 1446  LIPASE 20   No results for input(s): AMMONIA in the last 168 hours. Diabetic: No results for input(s): HGBA1C in the last 72 hours. No results for input(s): GLUCAP in the last 168 hours. Cardiac Enzymes: No results for input(s): CKTOTAL, CKMB, CKMBINDEX, TROPONINI in the last 168 hours. No results for input(s): PROBNP in the last 8760 hours. Coagulation Profile: Recent Labs  Lab 06/11/19 1000 06/11/19 1446  06/13/19 0305  INR 5.9* 3.9* 3.1*   Thyroid Function Tests: No results for input(s): TSH, T4TOTAL, FREET4, T3FREE, THYROIDAB in the last 72 hours. Lipid Profile: No results for input(s): CHOL, HDL, LDLCALC, TRIG, CHOLHDL, LDLDIRECT in the last 72 hours. Anemia Panel: No results for input(s): VITAMINB12, FOLATE, FERRITIN, TIBC, IRON, RETICCTPCT in the last 72 hours. Urine analysis:    Component Value Date/Time   COLORURINE YELLOW 06/11/2019 1600   APPEARANCEUR CLEAR 06/11/2019 1600   LABSPEC 1.010 06/11/2019 1600   PHURINE 6.5 06/11/2019 1600   GLUCOSEU NEGATIVE 06/11/2019 1600   GLUCOSEU > or = 1000 mg/dL (AA) 03/01/2008 1109   HGBUR SMALL (A) 06/11/2019 1600   BILIRUBINUR NEGATIVE 06/11/2019 1600   KETONESUR NEGATIVE 06/11/2019 1600   PROTEINUR NEGATIVE 06/11/2019 1600   UROBILINOGEN 0.2 mg/dL 03/01/2008 1109   NITRITE NEGATIVE 06/11/2019 1600   LEUKOCYTESUR SMALL (A) 06/11/2019 1600   Sepsis Labs: Invalid input(s): PROCALCITONIN, [place]  Microbiology: Recent Results (from the past 240 hour(s))  Urine culture     Status: Abnormal   Collection Time: 06/11/19  2:55 PM   Specimen: Urine, Catheterized  Result Value Ref Range Status   Specimen Description   Final    URINE, CATHETERIZED Performed at [facility], [address] [place], [place] [address]    Special Requests   Final    NONE Performed at [facility], [address] [place], [place] [address]    Culture 60,000 COLONIES/mL STAPHYLOCOCCUS EPIDERMIDIS (A)  Final   Report Status 06/13/2019 FINAL  Final   Organism ID, Bacteria STAPHYLOCOCCUS EPIDERMIDIS (A)  Final      Susceptibility   Staphylococcus epidermidis - MIC*    CIPROFLOXACIN >=8 RESISTANT Resistant     GENTAMICIN <=0.5 SENSITIVE Sensitive     NITROFURANTOIN <=16 SENSITIVE Sensitive     OXACILLIN >=4 RESISTANT Resistant     TETRACYCLINE 4 SENSITIVE Sensitive     VANCOMYCIN 1 SENSITIVE Sensitive     TRIMETH/SULFA 80  RESISTANT Resistant     CLINDAMYCIN >=8 RESISTANT Resistant     RIFAMPIN <=0.5 SENSITIVE Sensitive     Inducible Clindamycin NEGATIVE Sensitive     * 60,000 COLONIES/mL STAPHYLOCOCCUS EPIDERMIDIS  SARS CORONAVIRUS 2 (TAT 6-24 HRS) Nasopharyngeal Nasopharyngeal Swab     Status: None   Collection Time: 06/12/19  2:03 AM   Specimen: Nasopharyngeal Swab  Result Value Ref Range Status   SARS Coronavirus 2 NEGATIVE NEGATIVE Final    Comment: (NOTE) SARS-CoV-2 target nucleic acids are NOT DETECTED. The SARS-CoV-2 RNA is generally detectable in upper and lower respiratory specimens during the acute phase of infection. Negative results do not preclude  SARS-CoV-2 infection, do not rule out co-infections with other pathogens, and should not be used as the sole basis for treatment or other patient management decisions. Negative results must be combined with clinical observations, patient history, and epidemiological information. The expected result is Negative. Fact Sheet for Patients: SugarRoll.be Fact Sheet for Healthcare Providers: https://www.woods-mathews.com/ This test is not yet approved or cleared by the [place] FDA and  has been authorized for detection and/or diagnosis of SARS-CoV-2 by FDA under an Emergency Use Authorization (EUA). This EUA will remain  in effect (meaning this test can be used) for the duration of the COVID-19 declaration under Section 56 4(b)(1) of the Act, 21 U.S.C. section 360bbb-3(b)(1), unless the authorization is terminated or revoked sooner. Performed at [facility], [place] [address] [place], [place] [address]     Radiology Studies: No results found.  Budd Palmer, MD Internal Medicine  Hospitalist   If 7PM-7AM, please contact night-coverage [email or web address] Password [facility] 06/14/2019, 7:25 AM

## 2019-06-15 ENCOUNTER — Ambulatory Visit: Payer: Medicare Other | Admitting: Family Medicine

## 2019-06-15 DIAGNOSIS — Z515 Encounter for palliative care: Secondary | ICD-10-CM

## 2019-06-15 DIAGNOSIS — C678 Malignant neoplasm of overlapping sites of bladder: Secondary | ICD-10-CM

## 2019-06-15 DIAGNOSIS — Z7189 Other specified counseling: Secondary | ICD-10-CM

## 2019-06-15 LAB — RENAL FUNCTION PANEL
Albumin: 3 g/dL — ABNORMAL LOW (ref 3.5–5.0)
Anion gap: 11 (ref 5–15)
BUN: 49 mg/dL — ABNORMAL HIGH (ref 8–23)
CO2: 23 mmol/L (ref 22–32)
Calcium: 8.6 mg/dL — ABNORMAL LOW (ref 8.9–10.3)
Chloride: 103 mmol/L (ref 98–111)
Creatinine, Ser: 2.58 mg/dL — ABNORMAL HIGH (ref 0.61–1.24)
GFR calc Af Amer: 28 mL/min — ABNORMAL LOW (ref >60)
GFR calc non Af Amer: 24 mL/min — ABNORMAL LOW (ref >60)
Glucose, Bld: 157 mg/dL — ABNORMAL HIGH (ref 70–99)
Phosphorus: 4 mg/dL (ref 2.5–4.6)
Potassium: 4.8 mmol/L (ref 3.5–5.1)
Sodium: 137 mmol/L (ref 135–145)

## 2019-06-15 LAB — TYPE AND SCREEN
ABO/RH(D): O POS
Antibody Screen: NEGATIVE
Unit division: 0

## 2019-06-15 LAB — CBC
HCT: 26.9 % — ABNORMAL LOW (ref 39.0–52.0)
Hemoglobin: 8.6 g/dL — ABNORMAL LOW (ref 13.0–17.0)
MCH: 29 pg (ref 26.0–34.0)
MCHC: 32 g/dL (ref 30.0–36.0)
MCV: 90.6 fL (ref 80.0–100.0)
Platelets: 162 10*3/uL (ref 150–400)
RBC: 2.97 MIL/uL — ABNORMAL LOW (ref 4.22–5.81)
RDW: 16.4 % — ABNORMAL HIGH (ref 11.5–15.5)
WBC: 14.6 10*3/uL — ABNORMAL HIGH (ref 4.0–10.5)
nRBC: 0 % (ref 0.0–0.2)

## 2019-06-15 LAB — BPAM RBC
Blood Product Expiration Date: 202102122359
ISSUE DATE / TIME: 202101151853
Unit Type and Rh: 5100

## 2019-06-15 LAB — PROTIME-INR
INR: 2.3 — ABNORMAL HIGH (ref 0.8–1.2)
Prothrombin Time: 25.5 seconds — ABNORMAL HIGH (ref 11.4–15.2)

## 2019-06-15 LAB — MAGNESIUM: Magnesium: 1.8 mg/dL (ref 1.7–2.4)

## 2019-06-15 MED ORDER — WARFARIN - PHARMACIST DOSING INPATIENT: Freq: Every day | Status: DC

## 2019-06-15 MED ORDER — WARFARIN SODIUM 5 MG PO TABS
5.0000 mg | ORAL_TABLET | Freq: Once | ORAL | Status: AC
  Administered 2019-06-15: 18:00:00 5 mg via ORAL
  Filled 2019-06-15: qty 1

## 2019-06-15 NOTE — TOC Progression Note (Signed)
Transition of Care [facility]) - Progression Note    Patient Details  Name: Sergio Pennington MRN: [id number] Date of Birth: September 15, 1950  [facility] CM/SW Contact  Purcell Mouton, RN Phone Number: 06/15/2019, 4:24 PM  Clinical Narrative:     Pt is a readmission. Discharged home with his wife and [facility]. Plan to return to home.   Expected Discharge Plan: [place] Barriers to Discharge: No Barriers Identified  Expected Discharge Plan and Services Expected Discharge Plan: [place]   Discharge Planning Services: CM Consult                               HH Arranged: RN, PT [facility] Agency: [facility] Date [place]: 06/15/19 Time [place]: 1622     Social Determinants of Health (SDOH) Interventions    Readmission Risk Interventions No flowsheet data found.

## 2019-06-15 NOTE — Progress Notes (Signed)
PROGRESS NOTE  Sergio Pennington ZMO:[id number] DOB: 1951-05-26   PCP: Darreld Mclean, MD  Patient is from: Home.  DOA: 06/11/2019 LOS: 4  Brief Narrative / Interim history: 69 year old male with history of bladder cancer with possible mets to pelvic and retroperitoneal lymph nodes, s/p cystoprostatectomy/ileal conduit and urostomy by Dr. Tresa Moore in 11/2018, DM-2, HTN, recent hospitalization from 12/30-1/8 for KPTWS-56 infection complicated by DVT now on warfarin sent to ED from urology office for worsening AKI, abdominal pain, weakness and fatigue.  In ED, hemodynamically stable.  On room air.  NA 133.  K5.5. Cr 3.17 (baseline 2.3-2.4).  BUN 62.  WBC 11.4.  Hgb 8.0 otherwise, CBC and CMP without significant finding.  PT/INR 38.4/3.9.  CT abdomen and pelvis with worsening moderate bilateral hydronephrosis, increased periureteral stranding and probable urothelial thickening to the level of ileal conduit, increasing pelvic soft tissue nodularity and extensive retroperitoneal and pelvic adenopathy compatible with worsening metastasis, stable complex cystic lesion of right kidney and irregular lucency at L3, and worsening anasarca including pleural effusion and moderate ascites.  Admitted for AKI, hyperkalemia and possible complicated UTI.  Cultures obtained.  Started on ceftriaxone.  Urology and oncology consulted and following.   Subjective: Reports doing fair.  Complains of soreness in his flanks.  He is worried that one of his nephrostomy bags is feeling faster than the other.  Discussed his tubes are working fine and it may be that one of his kidneys is functioning better than the other.  Objective: Vitals:   06/14/19 1820 06/14/19 1943 06/15/19 0030 06/15/19 0414  BP: 136/75 (!) 147/88 (!) 154/79 (!) 158/72  Pulse: 90 88 88 81  Resp:  20 18 18   Temp: 99.5 F (37.5 C) 99.4 F (37.4 C) 99.4 F (37.4 C) 99.6 F (37.6 C)  TempSrc: Oral Oral Oral   SpO2: 98% 97% 99% 97%    Weight:      Height:        Intake/Output Summary (Last 24 hours) at 06/15/2019 0733 Last data filed at 06/15/2019 0600 Gross per 24 hour  Intake 2569.43 ml  Output 2845 ml  Net -275.57 ml   Filed Weights   06/11/19 1434  Weight: 81 kg    Examination:  GENERAL: No apparent distress.  Tired appearing HEENT: MMM.  Vision and hearing grossly intact.  NECK: Supple.  No apparent JVD.  RESP:  No IWOB. Good air movement bilaterally. CVS:  RRR. Heart sounds normal.  ABD/GI/GU: Bowel sounds present. Soft. Non tender.  Ileostomy over RLQ.  Red-tinged urine in bilateral nephrostomy bags. MSK/EXT:  Moves extremities. No apparent deformity.  1-2 pitting pedal edema bilaterally SKIN: no apparent skin lesion or wound NEURO: Awake, alert and oriented appropriately.  No apparent focal neuro deficit. PSYCH: Calm. Normal affect.  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  Improving after placement of nephrostomy tube.  He does not appear to be on nephrotoxic meds.  No significant improvement with IV fluid.  Likely obstructive given worsening bilateral hydronephrosis with increasing periureteral stranding in setting of complex postoperative anatomy. -IV fluids were discontinued given worsening edema and no improvement in renal function -Avoid nephrotoxic meds -S/p nephrostomy tube placement by IR on 06/14/2019  Fever Leukocytosis.  Episode of severe rigors, fever yesterday after nephrostomy procedure. ?  Bacteremia after procedure given urine culture was positive for coagulase-negative staph Blood cultures no growth in less than 24 hours Continue broad-spectrum antibiotics  cefepime, daptomycin for now  Complicated urinary tract infection in a patient with ileal conduit and urostomy: CT abdomen and pelvis with  increased periureteral stranding and probable urothelial thickening to the level of ileal conduit.  No prior  history of MDR or ESBL.  Urine culture with 60,000 methicillin-resistant staph epidermidis.   -Treatment as above  Metastatic bladder cancer s/p cystoprostatectomy with ileal conduit and urostomy concerning for metastasis to pelvic and retroperitoneal lymph nodes, right kidney and sacral spine-followed by Dr. Tresa Moore, urology and Dr. Alen Blew, oncology -Management per urology and oncology-both consulted. -Palliative care consulted.  Anasarca: Multifactorial including anemia, hypoalbuminemia and malignancy.  Holding IV fluids.  Symptomatic mixed iron deficiency anemia and anemia of chronic disease: Baseline Hgb 8-9.  Anemia panel on 12/31 concerning with iron deficiency but patient was is COVID-19 which makes it difficult to interpret. Currently at baseline  Hyperkalemia: Likely due to renal failure.  -Resolved.   COVID-19 infection: Tested positive on 05/27/2019.  Hospitalized 12/30-1/8.  Completed treatment course.  No respiratory symptoms.  COVID-19 PCR negative.   -Continue airborne and contact precautions until 06/18/2019 despite negative result.  DVT: Recently diagnosed.  INR therapeutic  -Warfarin was held for nephrostomy tube placement. Re-ordered per pharmacy consult.   Goal of care discussion:  Code status changed to DNR/DNI by previous provider.  Appreciate palliative care consult.  Patient would like to go home with physical therapy to regain some strength and consider hospice thereafter.  If patient continues to do better from infection/renal standpoint, consider discharge home with home health PT/OT services with plan for outpatient palliative follow-up and consideration for hospice in the future.  DVT prophylaxis: INR therapeutic. Will order coumadin pharmacy dosed Code Status: DNR/DNI Family Communication: Updated patient's wife via telephone Disposition Plan: Remains inpatient for AKI, complicated UTI, possible sepsis.  Consultants: Urology, oncology, palliative     Microbiology summarized: 12/30-POC COVID-19 positive. 1/15-COVID-19 negative. 1/15-urine culture grew staph epidermidis. 1/17-blood cultures no growth till date  Sch Meds:  Scheduled Meds: . sodium chloride   Intravenous Once  . Chlorhexidine Gluconate Cloth  6 each Topical Daily  . sodium chloride flush  10-40 mL Intracatheter Q12H   Continuous Infusions: . sodium chloride 10 mL/hr at 06/13/19 1722  . ceFEPime (MAXIPIME) IV 2 g (06/14/19 1824)  . DAPTOmycin (CUBICIN)  IV 486 mg (06/14/19 1936)   PRN Meds:.sodium chloride, acetaminophen, ondansetron **OR** ondansetron (ZOFRAN) IV, sodium chloride flush  Antimicrobials: Anti-infectives (From admission, onward)   Start     Dose/Rate Route Frequency Ordered Stop   06/14/19 1800  DAPTOmycin (CUBICIN) 486 mg in sodium chloride 0.9 % IVPB     6 mg/kg  81 kg 219.4 mL/hr over 30 Minutes Intravenous Every 48 hours 06/14/19 1726     06/14/19 1630  ceFEPIme (MAXIPIME) 2 g in sodium chloride 0.9 % 100 mL IVPB     2 g 200 mL/hr over 30 Minutes Intravenous Every 24 hours 06/14/19 1559     06/12/19 1800  cefTRIAXone (ROCEPHIN) 1 g in sodium chloride 0.9 % 100 mL IVPB  Status:  Discontinued     1 g 200 mL/hr over 30 Minutes Intravenous Every 24 hours 06/12/19 0207 06/14/19 1557   06/11/19 1800  cefTRIAXone (ROCEPHIN) 1 g in sodium chloride 0.9 % 100 mL IVPB     1 g 200 mL/hr over 30 Minutes Intravenous  Once 06/11/19 1747 06/11/19 1837       I have personally reviewed the following labs and images: CBC: Recent Labs  Lab 06/11/19 1446 06/12/19 0500 06/13/19 0305 06/14/19 0650 06/15/19 0500  WBC 11.4* 10.0 10.0 15.7* 14.6*  NEUTROABS 9.4*  --   --   --   --   HGB 8.0* 7.5* 8.2* 9.1* 8.6*  HCT 25.2* 24.0* 26.0* 28.5* 26.9*  MCV 93.7 94.1 90.6 89.9 90.6  PLT 243 245 239 260 162   BMP &GFR Recent Labs  Lab 06/12/19 0500 06/13/19 0305 06/14/19 0650 06/14/19 1549 06/15/19 0500  NA 137 136 136 137 137  K 5.6* 4.7 5.3*  5.4* 4.8  CL 103 104 102 104 103  CO2 22 21* 19* 18* 23  GLUCOSE 124* 143* 144* 154* 157*  BUN 61* 61* 67* 63* 49*  CREATININE 3.15* 3.11* 4.47* 4.31* 2.58*  CALCIUM 8.4* 8.4* 8.6* 8.8* 8.6*  MG  --  1.8 1.6*  --  1.8  PHOS  --  4.7* 5.0*  --  4.0   Estimated Creatinine Clearance: 29.2 mL/min (A) (by C-G formula based on SCr of 2.58 mg/dL (H)). Liver & Pancreas: Recent Labs  Lab 06/11/19 1446 06/13/19 0305 06/14/19 0650 06/14/19 1549 06/15/19 0500  AST 19  --   --  21  --   ALT 23  --   --  23  --   ALKPHOS 72  --   --  73  --   BILITOT 0.3  --   --  0.6  --   PROT 6.0*  --   --  6.6  --   ALBUMIN 2.9* 3.3* 3.2* 3.2* 3.0*   Recent Labs  Lab 06/11/19 1446  LIPASE 20   No results for input(s): AMMONIA in the last 168 hours. Diabetic: No results for input(s): HGBA1C in the last 72 hours. No results for input(s): GLUCAP in the last 168 hours. Cardiac Enzymes: Recent Labs  Lab 06/14/19 1638  CKTOTAL 20*   No results for input(s): PROBNP in the last 8760 hours. Coagulation Profile: Recent Labs  Lab 06/11/19 1000 06/11/19 1446 06/13/19 0305 06/14/19 0650 06/15/19 0500  INR 5.9* 3.9* 3.1* 2.4* 2.3*   Thyroid Function Tests: No results for input(s): TSH, T4TOTAL, FREET4, T3FREE, THYROIDAB in the last 72 hours. Lipid Profile: No results for input(s): CHOL, HDL, LDLCALC, TRIG, CHOLHDL, LDLDIRECT in the last 72 hours. Anemia Panel: No results for input(s): VITAMINB12, FOLATE, FERRITIN, TIBC, IRON, RETICCTPCT in the last 72 hours. Urine analysis:    Component Value Date/Time   COLORURINE YELLOW 06/11/2019 1600   APPEARANCEUR CLEAR 06/11/2019 1600   LABSPEC 1.010 06/11/2019 1600   PHURINE 6.5 06/11/2019 1600   GLUCOSEU NEGATIVE 06/11/2019 1600   GLUCOSEU > or = 1000 mg/dL (AA) 03/01/2008 1109   HGBUR SMALL (A) 06/11/2019 1600   BILIRUBINUR NEGATIVE 06/11/2019 1600   KETONESUR NEGATIVE 06/11/2019 1600   PROTEINUR NEGATIVE 06/11/2019 1600   UROBILINOGEN 0.2  mg/dL 03/01/2008 1109   NITRITE NEGATIVE 06/11/2019 1600   LEUKOCYTESUR SMALL (A) 06/11/2019 1600   Sepsis Labs: Invalid input(s): PROCALCITONIN, [place]  Microbiology: Recent Results (from the past 240 hour(s))  Urine culture     Status: Abnormal   Collection Time: 06/11/19  2:55 PM   Specimen: Urine, Catheterized  Result Value Ref Range Status   Specimen Description   Final    URINE, CATHETERIZED Performed at [facility], [address] [place], [place] [address]    Special Requests   Final    NONE Performed at [facility], [address] [place], [place] [address]  Culture 60,000 COLONIES/mL STAPHYLOCOCCUS EPIDERMIDIS (A)  Final   Report Status 06/13/2019 FINAL  Final   Organism ID, Bacteria STAPHYLOCOCCUS EPIDERMIDIS (A)  Final      Susceptibility   Staphylococcus epidermidis - MIC*    CIPROFLOXACIN >=8 RESISTANT Resistant     GENTAMICIN <=0.5 SENSITIVE Sensitive     NITROFURANTOIN <=16 SENSITIVE Sensitive     OXACILLIN >=4 RESISTANT Resistant     TETRACYCLINE 4 SENSITIVE Sensitive     VANCOMYCIN 1 SENSITIVE Sensitive     TRIMETH/SULFA 80 RESISTANT Resistant     CLINDAMYCIN >=8 RESISTANT Resistant     RIFAMPIN <=0.5 SENSITIVE Sensitive     Inducible Clindamycin NEGATIVE Sensitive     * 60,000 COLONIES/mL STAPHYLOCOCCUS EPIDERMIDIS  SARS CORONAVIRUS 2 (TAT 6-24 HRS) Nasopharyngeal Nasopharyngeal Swab     Status: None   Collection Time: 06/12/19  2:03 AM   Specimen: Nasopharyngeal Swab  Result Value Ref Range Status   SARS Coronavirus 2 NEGATIVE NEGATIVE Final    Comment: (NOTE) SARS-CoV-2 target nucleic acids are NOT DETECTED. The SARS-CoV-2 RNA is generally detectable in upper and lower respiratory specimens during the acute phase of infection. Negative results do not preclude SARS-CoV-2 infection, do not rule out co-infections with other pathogens, and should not be used as the sole basis for treatment or other patient management  decisions. Negative results must be combined with clinical observations, patient history, and epidemiological information. The expected result is Negative. Fact Sheet for Patients: SugarRoll.be Fact Sheet for Healthcare Providers: https://www.woods-mathews.com/ This test is not yet approved or cleared by the [place] FDA and  has been authorized for detection and/or diagnosis of SARS-CoV-2 by FDA under an Emergency Use Authorization (EUA). This EUA will remain  in effect (meaning this test can be used) for the duration of the COVID-19 declaration under Section 56 4(b)(1) of the Act, 21 U.S.C. section 360bbb-3(b)(1), unless the authorization is terminated or revoked sooner. Performed at [facility], [place] [address], [place], [place] [address]     Radiology Studies: IR NEPHROSTOMY PLACEMENT LEFT  Result Date: 06/14/2019 CLINICAL DATA:  Bladder carcinoma, bilateral hydronephrosis, worsening renal function EXAM: BILATERAL PERCUTANEOUS NEPHROSTOMY CATHETER PLACEMENT UNDER ULTRASOUND AND FLUOROSCOPIC GUIDANCE FLUOROSCOPY TIME:  108 seconds; 12 mGy TECHNIQUE: The procedure, risks (including but not limited to bleeding, infection, organ damage ), benefits, and alternatives were explained to the patient. Questions regarding the procedure were encouraged and answered. The patient understands and consents to the procedure. Bilateral flank regions prepped with chlorhexidine, draped in usual sterile fashion, infiltrated locally with 1% lidocaine. Intravenous Fentanyl 195mcg and Versed 2mg  were administered as conscious sedation during continuous monitoring of the patient's level of consciousness and physiological / cardiorespiratory status by the radiology RN, with a total moderate sedation time of 31 minutes. Under real-time ultrasound guidance, a 21-gauge trocar needle was advanced into a posterior lower pole right renal calyx. Ultrasound image  documentation was saved. Urine spontaneously returned through the needle. Guidewire advanced. Passage into the proximal ureter was confirmed under fluoroscopy. Needle was exchanged over a guidewire for transitional dilator. Contrast injection confirmed appropriate positioning. Catheter was exchanged over a guidewire for a 10 French pigtail catheter, formed centrally within the right renal collecting system. Contrast injection confirms appropriate positioning and patency. In similar fashion, on the left, Under real-time ultrasound guidance, a 21-gauge trocar needle was advanced into a posterior lower pole left renal calyx. Ultrasound image documentation was saved. Urine spontaneously returned through the needle. Guidewire advanced. Passage into the  proximal ureter was confirmed under fluoroscopy. Needle was exchanged over a guidewire for transitional dilator. Contrast injection confirmed appropriate positioning. Catheter was exchanged over a guidewire for a 10 French pigtail catheter, formed centrally within the left renal collecting system. Contrast injection confirms appropriate positioning and patency. Both catheters secured externally with 0 Prolene sutures and StatLock, and placed to external drain bags. The patient tolerated the procedure well. COMPLICATIONS: COMPLICATIONS none IMPRESSION: 1. Technically successful bilateral percutaneous nephrostomy catheter placement. Electronically Signed   By: Lucrezia Europe M.D.   On: 06/14/2019 14:17   IR NEPHROSTOMY PLACEMENT RIGHT  Result Date: 06/14/2019 CLINICAL DATA:  Bladder carcinoma, bilateral hydronephrosis, worsening renal function EXAM: BILATERAL PERCUTANEOUS NEPHROSTOMY CATHETER PLACEMENT UNDER ULTRASOUND AND FLUOROSCOPIC GUIDANCE FLUOROSCOPY TIME:  108 seconds; 12 mGy TECHNIQUE: The procedure, risks (including but not limited to bleeding, infection, organ damage ), benefits, and alternatives were explained to the patient. Questions regarding the procedure were  encouraged and answered. The patient understands and consents to the procedure. Bilateral flank regions prepped with chlorhexidine, draped in usual sterile fashion, infiltrated locally with 1% lidocaine. Intravenous Fentanyl 118mcg and Versed 2mg  were administered as conscious sedation during continuous monitoring of the patient's level of consciousness and physiological / cardiorespiratory status by the radiology RN, with a total moderate sedation time of 31 minutes. Under real-time ultrasound guidance, a 21-gauge trocar needle was advanced into a posterior lower pole right renal calyx. Ultrasound image documentation was saved. Urine spontaneously returned through the needle. Guidewire advanced. Passage into the proximal ureter was confirmed under fluoroscopy. Needle was exchanged over a guidewire for transitional dilator. Contrast injection confirmed appropriate positioning. Catheter was exchanged over a guidewire for a 10 French pigtail catheter, formed centrally within the right renal collecting system. Contrast injection confirms appropriate positioning and patency. In similar fashion, on the left, Under real-time ultrasound guidance, a 21-gauge trocar needle was advanced into a posterior lower pole left renal calyx. Ultrasound image documentation was saved. Urine spontaneously returned through the needle. Guidewire advanced. Passage into the proximal ureter was confirmed under fluoroscopy. Needle was exchanged over a guidewire for transitional dilator. Contrast injection confirmed appropriate positioning. Catheter was exchanged over a guidewire for a 10 French pigtail catheter, formed centrally within the left renal collecting system. Contrast injection confirms appropriate positioning and patency. Both catheters secured externally with 0 Prolene sutures and StatLock, and placed to external drain bags. The patient tolerated the procedure well. COMPLICATIONS: COMPLICATIONS none IMPRESSION: 1. Technically  successful bilateral percutaneous nephrostomy catheter placement. Electronically Signed   By: Lucrezia Europe M.D.   On: 06/14/2019 14:17    Budd Palmer, MD Internal Medicine  Hospitalist   If 7PM-7AM, please contact night-coverage [email or web address] Password [facility] 06/15/2019, 7:33 AM

## 2019-06-15 NOTE — Progress Notes (Signed)
ANTICOAGULATION CONSULT NOTE - Initial Consult  Pharmacy Consult for Warfarin  Indication: bilateral DVT  Allergies  Allergen Reactions  . Sulfa Antibiotics Hives    Patient Measurements: Height: 6' (182.9 cm) Weight: 178 lb 9.2 oz (81 kg) IBW/kg (Calculated) : 77.6  Vital Signs: Temp: 98.3 F (36.8 C) (01/18 1138) Temp Source: Oral (01/18 1138) BP: 144/68 (01/18 1138) Pulse Rate: 86 (01/18 1138)  Labs: Recent Labs    06/13/19 0305 06/13/19 0305 06/14/19 0650 06/14/19 1549 06/14/19 1638 06/15/19 0500  HGB 8.2*   < > 9.1*  --   --  8.6*  HCT 26.0*  --  28.5*  --   --  26.9*  PLT 239  --  260  --   --  162  LABPROT 32.0*  --  26.1*  --   --  25.5*  INR 3.1*  --  2.4*  --   --  2.3*  CREATININE 3.11*   < > 4.47* 4.31*  --  2.58*  CKTOTAL  --   --   --   --  20*  --    < > = values in this interval not displayed.    Estimated Creatinine Clearance: 29.2 mL/min (A) (by C-G formula based on SCr of 2.58 mg/dL (H)).   Medical History: Past Medical History:  Diagnosis Date  . Bladder cancer ([place])   . Cellulitis    Left leg   . Constipation   . Diabetes mellitus without complication ([place])   . Diabetic neuropathy ([place])    Feet  . Hematuria 04/21/2018  . Hyperlipidemia   . 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 Vasculitis ([facility])    Left leg    Medications:  PTA Warfarin 5mg  PO daily-last dose 06/10/2019 ~ 0700  Assessment: 74 yoM with metastatic bladder cancer, recent COVID-19, recently diagnosed bilateral DVT on warfarin PTA admitted with worsening AKI, abdominal pain, weakness,and fatigue. CT abdomen and pelvis with worsening moderate bilateral hydroureteronephrosis, increased periureteral stranding and probable urothelial thickening to the level of ileal conduit, increasing pelvic soft tissue nodularity and extensive retroperitoneal and pelvic adenopathy compatible with worsening metastasis, stable complex cystic lesion of right  kidney and irregular lucency at S3, and worsening anasarca including pleural effusion and moderate ascites. Patient is s/p nephrostomy tube placement by IR on 06/14/2019. Pharmacy consulted today, 06/15/2019, to resume warfarin. INR 5.9 on admission, down to 2.3 today. Hgb low/stable at 8.6, Pltc WNL.   Goal of Therapy:  INR 2-3 Monitor platelets by anticoagulation protocol: Yes   Plan:  Warfarin 5mg  PO x 1 today  Daily PT/INR Monitor closely for s/sx of bleeding    Lindell Spar, PharmD, BCPS Clinical Pharmacist  06/15/2019,2:34 PM

## 2019-06-15 NOTE — Consult Note (Signed)
Palliative care consult  Reason for consult: Goals of care in light of metastatic bladder cancer  Palliative care consult received.  Chart reviewed including personal review of pertinent labs and imaging.  Discussed with bedside RN.  Briefly, Sergio Pennington is a 69 year old male with history of bladder cancer with possible mets pelvic retroperitoneal lymph nodes status post cystoprostatectomy/ileal conduit and urostomy with recent hospitalization for [id number]-44 infection complicated by DVT who presents from urology office with worsening AKI, abdominal pain, weakness and fatigue.  He is now status post nephrostomy tube placement.  Palliative consulted for goals of care as he is reported that he is considering transition home with the support of hospice.  I met today with Sergio Pennington.   I introduced palliative care as specialized medical care for people living with serious illness. It focuses on providing relief from the symptoms and stress of a serious illness. The goal is to improve quality of life for both the patient and the family.  Values and goals of care important to patient and family were attempted to be elicited.  The most important thing to patient is his wife.  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  He reports having good understanding of his condition and that "the end is near."  He states that he is not afraid of dying but wants to ensure that he is able to enjoy whatever time he has left.  We discussed clinical course as well as wishes moving forward in regard to advanced directives.  Concepts specific to code status and rehospitalization discussed.  We discussed difference between a aggressive medical intervention path and a palliative, comfort focused care path.    He reports being open to idea of hospice, but he feels that he needs opportunity to rehab on discharge from the hospital.  Discussed that this is not  usually offered under hospice.  He in in agreement that a good plan would be to transition him home with home health and plan for home PT/OT on discharge. He has done well with rehabbing in the past. If he does well at home and continues to thrive, I encouraged they continue with this plan. If, however he is unable to regain function and he continues to decline, I recommended that he be followed by outpatient palliative care provider who can assist in refocusing his care on staying at home with support of hospice.  Questions and concerns addressed.   PMT will continue to support holistically.  - DNR/DNI - He reports that he is not interested in further chemotherapy/immunotherapy. - He would like to have home PT/OT on discharge. - Recommend he be followed by outpatient palliative care on discharge. - Called and discussed all of the above with his wife as well. - Palliative will not follow patient daily.  Please call if there are areas with which we can be of further assistance in the care of Sergio Pennington.  Total time: 60 minutes  Greater than 50%  of this time was spent counseling and coordinating care related to the above assessment and plan.  Micheline Rough, MD [facility] [phone]

## 2019-06-15 NOTE — Progress Notes (Signed)
Events noted in the last few days.  Nephrostomy tube placed with improvement in his kidney function at this time with creatinine climbing.  Vitals currently stable after episode of fever post nephrostomy tube insertion.   At this time, I recommend continued supportive management at this time.  His prognosis remains poor for malignancy standpoint.  Any treatment option whether it is further chemotherapy or immunotherapy would be palliative.  This has been discussed in detail with the patient and likely will buy him a limited amount of time probably in the neighborhood of 2 to 3 months additional overall survival.  I appreciate the input from the palliative care team and the care of the primary hospitalist team.

## 2019-06-16 ENCOUNTER — Encounter: Payer: Self-pay | Admitting: Family Medicine

## 2019-06-16 ENCOUNTER — Other Ambulatory Visit: Payer: Self-pay | Admitting: Family Medicine

## 2019-06-16 LAB — CBC
HCT: 27.6 % — ABNORMAL LOW (ref 39.0–52.0)
Hemoglobin: 8.8 g/dL — ABNORMAL LOW (ref 13.0–17.0)
MCH: 28.8 pg (ref 26.0–34.0)
MCHC: 31.9 g/dL (ref 30.0–36.0)
MCV: 90.2 fL (ref 80.0–100.0)
Platelets: 181 10*3/uL (ref 150–400)
RBC: 3.06 MIL/uL — ABNORMAL LOW (ref 4.22–5.81)
RDW: 15.9 % — ABNORMAL HIGH (ref 11.5–15.5)
WBC: 13.6 10*3/uL — ABNORMAL HIGH (ref 4.0–10.5)
nRBC: 0 % (ref 0.0–0.2)

## 2019-06-16 LAB — RENAL FUNCTION PANEL
Albumin: 2.9 g/dL — ABNORMAL LOW (ref 3.5–5.0)
Anion gap: 11 (ref 5–15)
BUN: 36 mg/dL — ABNORMAL HIGH (ref 8–23)
CO2: 25 mmol/L (ref 22–32)
Calcium: 8.7 mg/dL — ABNORMAL LOW (ref 8.9–10.3)
Chloride: 100 mmol/L (ref 98–111)
Creatinine, Ser: 1.57 mg/dL — ABNORMAL HIGH (ref 0.61–1.24)
GFR calc Af Amer: 51 mL/min — ABNORMAL LOW (ref >60)
GFR calc non Af Amer: 44 mL/min — ABNORMAL LOW (ref >60)
Glucose, Bld: 166 mg/dL — ABNORMAL HIGH (ref 70–99)
Phosphorus: 2.6 mg/dL (ref 2.5–4.6)
Potassium: 4.3 mmol/L (ref 3.5–5.1)
Sodium: 136 mmol/L (ref 135–145)

## 2019-06-16 LAB — PROTIME-INR
INR: 1.9 — ABNORMAL HIGH (ref 0.8–1.2)
Prothrombin Time: 21.5 seconds — ABNORMAL HIGH (ref 11.4–15.2)

## 2019-06-16 LAB — MAGNESIUM: Magnesium: 1.5 mg/dL — ABNORMAL LOW (ref 1.7–2.4)

## 2019-06-16 MED ORDER — DOXYCYCLINE HYCLATE 100 MG PO TABS
100.0000 mg | ORAL_TABLET | Freq: Two times a day (BID) | ORAL | Status: DC
  Administered 2019-06-16 – 2019-06-17 (×2): 100 mg via ORAL
  Filled 2019-06-16 (×2): qty 1

## 2019-06-16 MED ORDER — SODIUM CHLORIDE 0.9 % IV SOLN
2.0000 g | Freq: Two times a day (BID) | INTRAVENOUS | Status: DC
  Administered 2019-06-16: 2 g via INTRAVENOUS
  Filled 2019-06-16 (×2): qty 2

## 2019-06-16 MED ORDER — ENOXAPARIN SODIUM 80 MG/0.8ML ~~LOC~~ SOLN
1.0000 mg/kg | Freq: Two times a day (BID) | SUBCUTANEOUS | Status: DC
  Administered 2019-06-16 (×2): 80 mg via SUBCUTANEOUS
  Filled 2019-06-16 (×2): qty 0.8

## 2019-06-16 MED ORDER — WARFARIN SODIUM 6 MG PO TABS
6.0000 mg | ORAL_TABLET | Freq: Once | ORAL | Status: AC
  Administered 2019-06-16: 6 mg via ORAL
  Filled 2019-06-16: qty 1

## 2019-06-16 MED ORDER — MAGNESIUM SULFATE 4 GM/100ML IV SOLN
4.0000 g | Freq: Once | INTRAVENOUS | Status: AC
  Administered 2019-06-16: 4 g via INTRAVENOUS
  Filled 2019-06-16: qty 100

## 2019-06-16 NOTE — Progress Notes (Signed)
Physical Therapy Treatment Patient Details Name: Sergio Pennington MRN: [id number] DOB: 1950/08/18 Today's Date: 06/16/2019    History of Present Illness 69 y.o. male with medical history significant of bladder cancer status post urostomy, type 2 diabetes, hypertension, hyperlipidemia, recent hospitalization for COVID 19 admitted from urology office with worsening AKI, generalized weakness and fatigue.     PT Comments    Pt slow to mobilize due to soreness, new bil PCN drains however tolerated improved distance today.  Pt in good spirits and plans to d/c home eventually.   Follow Up Recommendations  Home health PT(if able)     Equipment Recommendations  None recommended by PT    Recommendations for Other Services       Precautions / Restrictions Precautions Precautions: Fall Precaution Comments: urostomy, bil PCNs Restrictions Weight Bearing Restrictions: No    Mobility  Bed Mobility Overal bed mobility: Needs Assistance Bed Mobility: Supine to Sit;Sit to Supine     Supine to sit: Supervision Sit to supine: Supervision   General bed mobility comments: pt requiring increased time due to soreness and bil PCNs  Transfers Overall transfer level: Needs assistance Equipment used: Rolling walker (2 wheeled) Transfers: Sit to/from Stand Sit to Stand: Min guard         General transfer comment: for safety.  recalls correct hand placement, more difficulty observed to rise however min/guard  Ambulation/Gait Ambulation/Gait assistance: Min guard Gait Distance (Feet): 100 Feet Assistive device: Rolling walker (2 wheeled) Gait Pattern/deviations: Step-through pattern;Decreased stride length     General Gait Details: pt ambulated a few laps around room, slow but steady   Stairs             Wheelchair Mobility    Modified Rankin (Stroke Patients Only)       Balance                                            Cognition  Arousal/Alertness: Awake/alert Behavior During Therapy: WFL for tasks assessed/performed Overall Cognitive Status: Within Functional Limits for tasks assessed                                        Exercises      General Comments        Pertinent Vitals/Pain Pain Assessment: Faces Faces Pain Scale: Hurts little more Pain Location: drain areas Pain Descriptors / Indicators: Sore Pain Intervention(s): Monitored during session;Repositioned    Home Living                      Prior Function            PT Goals (current goals can now be found in the care plan section) Progress towards PT goals: Progressing toward goals    Frequency    Min 2X/week      PT Plan Current plan remains appropriate    Co-evaluation              AM-PAC PT "6 Clicks" Mobility   Outcome Measure  Help needed turning from your back to your side while in a flat bed without using bedrails?: None Help needed moving from lying on your back to sitting on the side of a flat bed without using bedrails?: None Help needed moving to  and from a bed to a chair (including a wheelchair)?: None Help needed standing up from a chair using your arms (e.g., wheelchair or bedside chair)?: A Little Help needed to walk in hospital room?: A Little Help needed climbing 3-5 steps with a railing? : A Little 6 Click Score: 21    End of Session   Activity Tolerance: Patient tolerated treatment well Patient left: in bed;with call bell/phone within reach Nurse Communication: Mobility status PT Visit Diagnosis: Muscle weakness (generalized) (M62.81);Difficulty in walking, not elsewhere classified (R26.2)     Time: 5277-8242 PT Time Calculation (min) (ACUTE ONLY): 27 min  Charges:  $Gait Training: 23-37 mins                     Arlyce Dice, DPT Acute Rehabilitation Services Office: [phone]  Trena Platt 06/16/2019, 4:36 PM

## 2019-06-16 NOTE — Care Management Important Message (Signed)
Important Message  Patient Details IM Letter prented to the Patient Name: Sergio Pennington MRN: [id number] Date of Birth: 01-Mar-1951   Medicare Important Message Given:  Yes     Kerin Salen 06/16/2019, 1:33 PM

## 2019-06-16 NOTE — Progress Notes (Signed)
PROGRESS NOTE  Sergio Pennington GNF:[id number] DOB: 1951-02-19   PCP: Darreld Mclean, MD  Patient is from: Home.  DOA: 06/11/2019 LOS: 5  Brief Narrative / Interim history: 69 year old male with history of bladder cancer with possible mets to pelvic and retroperitoneal lymph nodes, s/p cystoprostatectomy/ileal conduit and urostomy by Dr. Tresa Moore in 11/2018, DM-2, HTN, recent hospitalization from 12/30-1/8 for QIONG-29 infection complicated by DVT now on warfarin sent to ED from urology office for worsening AKI, abdominal pain, weakness and fatigue.  In ED, hemodynamically stable.  On room air.  NA 133.  K5.5. Cr 3.17 (baseline 2.3-2.4).  BUN 62.  WBC 11.4.  Hgb 8.0 otherwise, CBC and CMP without significant finding.  PT/INR 38.4/3.9.  CT abdomen and pelvis with worsening moderate bilateral hydronephrosis, increased periureteral stranding and probable urothelial thickening to the level of ileal conduit, increasing pelvic soft tissue nodularity and extensive retroperitoneal and pelvic adenopathy compatible with worsening metastasis, stable complex cystic lesion of right kidney and irregular lucency at L3, and worsening anasarca including pleural effusion and moderate ascites.  Found to have MRSE on urine culture 06/10/2018.  Nephrostomy tubes placed for hydronephrosis and worsening renal function on 06/14/2019.  Since, his renal function has improved.  Initially placed on daptomycin and cefepime for fever and concern for MRSA bacteremia but given negative cultures, will switch to oral antibiotics.  Palliative on board.  Subjective: Reports feeling better.  Hopeful that he will regain some strength with home health PT.  Objective: Vitals:   06/15/19 0414 06/15/19 1138 06/15/19 2044 06/16/19 0618  BP: (!) 158/72 (!) 144/68 (!) 153/88 (!) 153/74  Pulse: 81 86 87 84  Resp: 18 18 20 20   Temp: 99.6 F (37.6 C) 98.3 F (36.8 C) 98.1 F (36.7 C) 99.1 F (37.3 C)  TempSrc:  Oral      SpO2: 97% 99% 97% 95%  Weight:      Height:        Intake/Output Summary (Last 24 hours) at 06/16/2019 0728 Last data filed at 06/16/2019 5284 Gross per 24 hour  Intake 623.33 ml  Output 2225 ml  Net -1601.67 ml   Filed Weights   06/11/19 1434  Weight: 81 kg    Examination:  GENERAL: No apparent distress.  Tired appearing.  Significant diffuse muscle wasting. HEENT: MMM.  Vision and hearing grossly intact.  NECK: Supple.  No apparent JVD.  RESP:  No IWOB. Good air movement bilaterally. CVS:  RRR. Heart sounds normal.  ABD/GI/GU: Bowel sounds present. Soft. Non tender.  Ileostomy over RLQ.  Red-tinged urine in bilateral nephrostomy bags. MSK/EXT:  Moves extremities. No apparent deformity.  1-2 pitting pedal edema bilaterally SKIN: no apparent skin lesion or wound NEURO: Awake, alert and oriented appropriately.  No apparent focal neuro deficit. PSYCH: Calm. Normal affect.  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  Improving after placement of nephrostomy tube.  He does not appear to be on nephrotoxic meds.  No significant improvement with IV fluid.  Likely obstructive given worsening bilateral hydronephrosis with increasing periureteral stranding in setting of complex postoperative anatomy. -IV fluids were discontinued given worsening edema and no improvement in renal function -Avoid nephrotoxic meds -S/p nephrostomy tube placement by IR on 06/14/2019  Fever Leukocytosis.  Episode of severe rigors, fever yesterday after nephrostomy procedure. ?  Transient bacteremia after procedure given urine culture was positive for coagulase-negative staph Blood cultures no growth in less than  48 hours Discussed case with Dr. Talbot Grumbling with ID who recommended doxycycline for 14 days from placement of nephrostomy tube on 06/14/2019 (end date 06/27/2019) D escalated antibiotics from daptomycin, cefepime to  doxycycline.  Complicated urinary tract infection in a patient with ileal conduit and urostomy: CT abdomen and pelvis with  increased periureteral stranding and probable urothelial thickening to the level of ileal conduit.  No prior history of MDR or ESBL.  Urine culture with 60,000 methicillin-resistant staph epidermidis.   -Treatment as above  Metastatic bladder cancer s/p cystoprostatectomy with ileal conduit and urostomy concerning for metastasis to pelvic and retroperitoneal lymph nodes, right kidney and sacral spine-followed by Dr. Tresa Moore, urology and Dr. Alen Blew, oncology -Management per urology and oncology-both consulted. -Palliative care consulted.  Appreciate recommendations.  Anasarca: Multifactorial including anemia, hypoalbuminemia and malignancy.  Holding IV fluids.  Symptomatic mixed iron deficiency anemia and anemia of chronic disease: Baseline Hgb 8-9.  Anemia panel on 12/31 concerning with iron deficiency but patient was is COVID-19 which makes it difficult to interpret. Currently at baseline  Hyperkalemia: Likely due to renal failure.  -Resolved.   COVID-19 infection: Tested positive on 05/27/2019.  Hospitalized 12/30-1/8.  Completed treatment course.  No respiratory symptoms.  COVID-19 PCR negative.   -Continue airborne and contact precautions until 06/18/2019 despite negative result.  Lower extremity DVT: Recently diagnosed on 05/28/2019.  Warfarin was held for nephrostomy procedure and restarted yesterday.  INR subtherapeutic today at 1.9. Given history of VTE in the last 3 months and active malignancy, will bridge him with Lovenox 1 mg/kg twice daily until INR gets therapeutic.  Per pharmacy, he may be discharged home on 1.5 mg/kg Lovenox if continued bridging is needed.  Goal of care discussion:  Code status changed to DNR/DNI by previous provider.  Appreciate palliative care consult.  Patient would like to go home with physical therapy to regain some strength and  consider hospice thereafter.  If patient continues to do better from infection/renal standpoint, consider discharge home with home health PT/OT services with plan for outpatient palliative follow-up and consideration for hospice in the future.  DVT prophylaxis: On full anticoagulation  Code Status: DNR/DNI Family Communication:  Disposition Plan: Tentative plan for discharge home with home health PT/OT services if he remains stable and creatinine continues to improve. Consultants: Urology, oncology, palliative    Microbiology summarized: 12/30-POC COVID-19 positive. 1/15-COVID-19 negative. 1/15-urine culture grew staph epidermidis. 1/17-blood cultures no growth till date  Sch Meds:  Scheduled Meds: . sodium chloride   Intravenous Once  . Chlorhexidine Gluconate Cloth  6 each Topical Daily  . sodium chloride flush  10-40 mL Intracatheter Q12H  . Warfarin - Pharmacist Dosing Inpatient   Does not apply q1800   Continuous Infusions: . sodium chloride 10 mL/hr at 06/13/19 1722  . ceFEPime (MAXIPIME) IV 2 g (06/15/19 1735)  . DAPTOmycin (CUBICIN)  IV 486 mg (06/14/19 1936)  . magnesium sulfate bolus IVPB     PRN Meds:.sodium chloride, acetaminophen, ondansetron **OR** ondansetron (ZOFRAN) IV, sodium chloride flush  Antimicrobials: Anti-infectives (From admission, onward)   Start     Dose/Rate Route Frequency Ordered Stop   06/14/19 1800  DAPTOmycin (CUBICIN) 486 mg in sodium chloride 0.9 % IVPB     6 mg/kg  81 kg 219.4 mL/hr over 30 Minutes Intravenous Every 48 hours 06/14/19 1726     06/14/19 1630  ceFEPIme (MAXIPIME) 2 g in sodium chloride 0.9 % 100 mL IVPB     2 g 200 mL/hr over  30 Minutes Intravenous Every 24 hours 06/14/19 1559     06/12/19 1800  cefTRIAXone (ROCEPHIN) 1 g in sodium chloride 0.9 % 100 mL IVPB  Status:  Discontinued     1 g 200 mL/hr over 30 Minutes Intravenous Every 24 hours 06/12/19 0207 06/14/19 1557   06/11/19 1800  cefTRIAXone (ROCEPHIN) 1 g in sodium  chloride 0.9 % 100 mL IVPB     1 g 200 mL/hr over 30 Minutes Intravenous  Once 06/11/19 1747 06/11/19 1837       I have personally reviewed the following labs and images: CBC: Recent Labs  Lab 06/11/19 1446 06/11/19 1446 06/12/19 0500 06/13/19 0305 06/14/19 0650 06/15/19 0500 06/16/19 0414  WBC 11.4*   < > 10.0 10.0 15.7* 14.6* 13.6*  NEUTROABS 9.4*  --   --   --   --   --   --   HGB 8.0*   < > 7.5* 8.2* 9.1* 8.6* 8.8*  HCT 25.2*   < > 24.0* 26.0* 28.5* 26.9* 27.6*  MCV 93.7   < > 94.1 90.6 89.9 90.6 90.2  PLT 243   < > 245 239 260 162 181   < > = values in this interval not displayed.   BMP &GFR Recent Labs  Lab 06/13/19 0305 06/14/19 0650 06/14/19 1549 06/15/19 0500 06/16/19 0414  NA 136 136 137 137 136  K 4.7 5.3* 5.4* 4.8 4.3  CL 104 102 104 103 100  CO2 21* 19* 18* 23 25  GLUCOSE 143* 144* 154* 157* 166*  BUN 61* 67* 63* 49* 36*  CREATININE 3.11* 4.47* 4.31* 2.58* 1.57*  CALCIUM 8.4* 8.6* 8.8* 8.6* 8.7*  MG 1.8 1.6*  --  1.8 1.5*  PHOS 4.7* 5.0*  --  4.0 2.6   Estimated Creatinine Clearance: 48.1 mL/min (A) (by C-G formula based on SCr of 1.57 mg/dL (H)). Liver & Pancreas: Recent Labs  Lab 06/11/19 1446 06/11/19 1446 06/13/19 0305 06/14/19 0650 06/14/19 1549 06/15/19 0500 06/16/19 0414  AST 19  --   --   --  21  --   --   ALT 23  --   --   --  23  --   --   ALKPHOS 72  --   --   --  73  --   --   BILITOT 0.3  --   --   --  0.6  --   --   PROT 6.0*  --   --   --  6.6  --   --   ALBUMIN 2.9*   < > 3.3* 3.2* 3.2* 3.0* 2.9*   < > = values in this interval not displayed.   Recent Labs  Lab 06/11/19 1446  LIPASE 20   No results for input(s): AMMONIA in the last 168 hours. Diabetic: No results for input(s): HGBA1C in the last 72 hours. No results for input(s): GLUCAP in the last 168 hours. Cardiac Enzymes: Recent Labs  Lab 06/14/19 1638  CKTOTAL 20*   No results for input(s): PROBNP in the last 8760 hours. Coagulation Profile: Recent Labs   Lab 06/11/19 1446 06/13/19 0305 06/14/19 0650 06/15/19 0500 06/16/19 0414  INR 3.9* 3.1* 2.4* 2.3* 1.9*   Thyroid Function Tests: No results for input(s): TSH, T4TOTAL, FREET4, T3FREE, THYROIDAB in the last 72 hours. Lipid Profile: No results for input(s): CHOL, HDL, LDLCALC, TRIG, CHOLHDL, LDLDIRECT in the last 72 hours. Anemia Panel: No results for input(s): VITAMINB12, FOLATE, FERRITIN, TIBC, IRON, RETICCTPCT  in the last 72 hours. Urine analysis:    Component Value Date/Time   COLORURINE YELLOW 06/11/2019 1600   APPEARANCEUR CLEAR 06/11/2019 1600   LABSPEC 1.010 06/11/2019 1600   PHURINE 6.5 06/11/2019 1600   GLUCOSEU NEGATIVE 06/11/2019 1600   GLUCOSEU > or = 1000 mg/dL (AA) 03/01/2008 1109   HGBUR SMALL (A) 06/11/2019 1600   BILIRUBINUR NEGATIVE 06/11/2019 1600   KETONESUR NEGATIVE 06/11/2019 1600   PROTEINUR NEGATIVE 06/11/2019 1600   UROBILINOGEN 0.2 mg/dL 03/01/2008 1109   NITRITE NEGATIVE 06/11/2019 1600   LEUKOCYTESUR SMALL (A) 06/11/2019 1600   Sepsis Labs: Invalid input(s): PROCALCITONIN, [place]  Microbiology: Recent Results (from the past 240 hour(s))  Urine culture     Status: Abnormal   Collection Time: 06/11/19  2:55 PM   Specimen: Urine, Catheterized  Result Value Ref Range Status   Specimen Description   Final    URINE, CATHETERIZED Performed at [facility], [address] [place], [place] [address]    Special Requests   Final    NONE Performed at [facility], [address] [place], [place] [address]    Culture 60,000 COLONIES/mL STAPHYLOCOCCUS EPIDERMIDIS (A)  Final   Report Status 06/13/2019 FINAL  Final   Organism ID, Bacteria STAPHYLOCOCCUS EPIDERMIDIS (A)  Final      Susceptibility   Staphylococcus epidermidis - MIC*    CIPROFLOXACIN >=8 RESISTANT Resistant     GENTAMICIN <=0.5 SENSITIVE Sensitive     NITROFURANTOIN <=16 SENSITIVE Sensitive     OXACILLIN >=4 RESISTANT Resistant     TETRACYCLINE 4  SENSITIVE Sensitive     VANCOMYCIN 1 SENSITIVE Sensitive     TRIMETH/SULFA 80 RESISTANT Resistant     CLINDAMYCIN >=8 RESISTANT Resistant     RIFAMPIN <=0.5 SENSITIVE Sensitive     Inducible Clindamycin NEGATIVE Sensitive     * 60,000 COLONIES/mL STAPHYLOCOCCUS EPIDERMIDIS  SARS CORONAVIRUS 2 (TAT 6-24 HRS) Nasopharyngeal Nasopharyngeal Swab     Status: None   Collection Time: 06/12/19  2:03 AM   Specimen: Nasopharyngeal Swab  Result Value Ref Range Status   SARS Coronavirus 2 NEGATIVE NEGATIVE Final    Comment: (NOTE) SARS-CoV-2 target nucleic acids are NOT DETECTED. The SARS-CoV-2 RNA is generally detectable in upper and lower respiratory specimens during the acute phase of infection. Negative results do not preclude SARS-CoV-2 infection, do not rule out co-infections with other pathogens, and should not be used as the sole basis for treatment or other patient management decisions. Negative results must be combined with clinical observations, patient history, and epidemiological information. The expected result is Negative. Fact Sheet for Patients: SugarRoll.be Fact Sheet for Healthcare Providers: https://www.woods-mathews.com/ This test is not yet approved or cleared by the [place] FDA and  has been authorized for detection and/or diagnosis of SARS-CoV-2 by FDA under an Emergency Use Authorization (EUA). This EUA will remain  in effect (meaning this test can be used) for the duration of the COVID-19 declaration under Section 56 4(b)(1) of the Act, 21 U.S.C. section 360bbb-3(b)(1), unless the authorization is terminated or revoked sooner. Performed at [facility], [place] [address], [place], [place] [address]   Culture, blood (routine x 2)     Status: None (Preliminary result)   Collection Time: 06/14/19  4:38 PM   Specimen: BLOOD  Result Value Ref Range Status   Specimen Description   Final    BLOOD BLOOD RIGHT  ARM Performed at  [facility]  [address] [place], [place] [address]    Special Requests   Final    BOTTLES DRAWN AEROBIC AND ANAEROBIC Blood Culture adequate volume Performed at [facility] [address], [place], [place] [address]    Culture   Final    NO GROWTH 2 DAYS Performed at [facility] [address] [place], [place] [address]    Report Status PENDING  Incomplete  Culture, blood (routine x 2)     Status: None (Preliminary result)   Collection Time: 06/14/19  4:38 PM   Specimen: BLOOD  Result Value Ref Range Status   Specimen Description   Final    BLOOD BLOOD RIGHT HAND Performed at [facility] [address], [place], [place] [address]    Special Requests   Final    BOTTLES DRAWN AEROBIC AND ANAEROBIC Blood Culture adequate volume Performed at [facility] [address] [place], [place] [address]    Culture   Final    NO GROWTH 2 DAYS Performed at [facility] [address], [place], [place] [address]    Report Status PENDING  Incomplete    Radiology Studies: No results found.  Budd Palmer, MD Internal Medicine  Hospitalist   If 7PM-7AM, please contact night-coverage [email or web address] Password [facility] 06/16/2019, 7:28 AM

## 2019-06-16 NOTE — Progress Notes (Addendum)
ANTICOAGULATION CONSULT NOTE  Pharmacy Consult for Warfarin  Indication: bilateral DVT  Allergies  Allergen Reactions  . Sulfa Antibiotics Hives    Patient Measurements: Height: 6' (182.9 cm) Weight: 178 lb 9.2 oz (81 kg) IBW/kg (Calculated) : 77.6  Vital Signs: Temp: 99.1 F (37.3 C) (01/19 0618) BP: 153/74 (01/19 0618) Pulse Rate: 84 (01/19 0618)  Labs: Recent Labs    06/14/19 0650 06/14/19 0650 06/14/19 1549 06/14/19 1638 06/15/19 0500 06/16/19 0414  HGB 9.1*   < >  --   --  8.6* 8.8*  HCT 28.5*  --   --   --  26.9* 27.6*  PLT 260  --   --   --  162 181  LABPROT 26.1*  --   --   --  25.5* 21.5*  INR 2.4*  --   --   --  2.3* 1.9*  CREATININE 4.47*   < > 4.31*  --  2.58* 1.57*  CKTOTAL  --   --   --  20*  --   --    < > = values in this interval not displayed.    Estimated Creatinine Clearance: 48.1 mL/min (A) (by C-G formula based on SCr of 1.57 mg/dL (H)).   Medical History: Past Medical History:  Diagnosis Date  . Bladder cancer ([place])   . Cellulitis    Left leg   . Constipation   . Diabetes mellitus without complication ([place])   . Diabetic neuropathy ([place])    Feet  . Hematuria 04/21/2018  . Hyperlipidemia   . 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 Vasculitis ([facility])    Left leg    Medications:  PTA Warfarin 5mg  PO daily-last dose 06/10/2019 ~ 0700  Assessment: 54 yoM with metastatic bladder cancer, recent COVID-19, recently diagnosed bilateral DVT on warfarin PTA admitted with worsening AKI, abdominal pain, weakness,and fatigue. CT abdomen and pelvis with worsening moderate bilateral hydroureteronephrosis, increased periureteral stranding and probable urothelial thickening to the level of ileal conduit, increasing pelvic soft tissue nodularity and extensive retroperitoneal and pelvic adenopathy compatible with worsening metastasis, stable complex cystic lesion of right kidney and irregular lucency at S3, and worsening  anasarca including pleural effusion and moderate ascites. Patient is s/p nephrostomy tube placement by IR on 06/14/2019. Pharmacy consulted 06/15/2019 to resume warfarin. INR 5.9 on admission, down to 2.3 on 06/15/2019.   Today, 06/16/19:  INR 1.9, now subtherapeutic  CBC: Hgb low/stable at 8.8, Pltc WNL  SCr improved to 1.57, CrCl now ~ 48 ml/min  No bleeding issues reported per nursing  DDI: Doxycycline may increase hypoprothrombinemic effects of warfarin   Goal of Therapy:  INR 2-3 Monitor platelets by anticoagulation protocol: Yes   Plan:  Per discussion with MD, add lovenox 1mg /kg SQ q12h until INR therapeutic.  Warfarin 6mg  PO x 1 today  Daily PT/INR Monitor CBC and for s/sx of bleeding    Lindell Spar, PharmD, BCPS Clinical Pharmacist  06/16/2019,9:52 AM

## 2019-06-17 ENCOUNTER — Encounter: Payer: Self-pay | Admitting: Family Medicine

## 2019-06-17 DIAGNOSIS — U071 COVID-19: Secondary | ICD-10-CM

## 2019-06-17 DIAGNOSIS — I82409 Acute embolism and thrombosis of unspecified deep veins of unspecified lower extremity: Secondary | ICD-10-CM

## 2019-06-17 LAB — CBC
HCT: 27.5 % — ABNORMAL LOW (ref 39.0–52.0)
Hemoglobin: 8.8 g/dL — ABNORMAL LOW (ref 13.0–17.0)
MCH: 28.6 pg (ref 26.0–34.0)
MCHC: 32 g/dL (ref 30.0–36.0)
MCV: 89.3 fL (ref 80.0–100.0)
Platelets: 198 10*3/uL (ref 150–400)
RBC: 3.08 MIL/uL — ABNORMAL LOW (ref 4.22–5.81)
RDW: 15.6 % — ABNORMAL HIGH (ref 11.5–15.5)
WBC: 11.8 10*3/uL — ABNORMAL HIGH (ref 4.0–10.5)
nRBC: 0 % (ref 0.0–0.2)

## 2019-06-17 LAB — RENAL FUNCTION PANEL
Albumin: 2.9 g/dL — ABNORMAL LOW (ref 3.5–5.0)
Anion gap: 11 (ref 5–15)
BUN: 28 mg/dL — ABNORMAL HIGH (ref 8–23)
CO2: 27 mmol/L (ref 22–32)
Calcium: 8.7 mg/dL — ABNORMAL LOW (ref 8.9–10.3)
Chloride: 98 mmol/L (ref 98–111)
Creatinine, Ser: 1.26 mg/dL — ABNORMAL HIGH (ref 0.61–1.24)
GFR calc Af Amer: >60 mL/min (ref >60)
GFR calc non Af Amer: 57 mL/min — ABNORMAL LOW (ref >60)
Glucose, Bld: 136 mg/dL — ABNORMAL HIGH (ref 70–99)
Phosphorus: 2.4 mg/dL — ABNORMAL LOW (ref 2.5–4.6)
Potassium: 3.9 mmol/L (ref 3.5–5.1)
Sodium: 136 mmol/L (ref 135–145)

## 2019-06-17 LAB — PROTIME-INR
INR: 2.1 — ABNORMAL HIGH (ref 0.8–1.2)
Prothrombin Time: 23.1 seconds — ABNORMAL HIGH (ref 11.4–15.2)

## 2019-06-17 LAB — MAGNESIUM: Magnesium: 1.9 mg/dL (ref 1.7–2.4)

## 2019-06-17 MED ORDER — HEPARIN SOD (PORK) LOCK FLUSH 100 UNIT/ML IV SOLN
500.0000 [IU] | INTRAVENOUS | Status: AC | PRN
  Administered 2019-06-17: 500 [IU]
  Filled 2019-06-17: qty 5

## 2019-06-17 MED ORDER — K PHOS MONO-SOD PHOS DI & MONO 155-852-130 MG PO TABS
250.0000 mg | ORAL_TABLET | Freq: Every day | ORAL | Status: DC
  Filled 2019-06-17 (×2): qty 1

## 2019-06-17 MED ORDER — ACETAMINOPHEN 500 MG PO TABS: 1000.0000 mg | ORAL_TABLET | Freq: Three times a day (TID) | ORAL | 0 refills | Status: AC | PRN

## 2019-06-17 MED ORDER — DOXYCYCLINE HYCLATE 100 MG PO TABS: 100.0000 mg | ORAL_TABLET | Freq: Two times a day (BID) | ORAL | 0 refills | Status: AC

## 2019-06-17 NOTE — Progress Notes (Signed)
Call placed to patients spouse [name] to update regarding plan of care and possible discharge for today.

## 2019-06-17 NOTE — Progress Notes (Signed)
Discharge instructions reviewed with patient and Kandis Mannan. Questions, concerns denied. Dressing supplies and additional instruction provided.

## 2019-06-17 NOTE — TOC Progression Note (Signed)
Transition of Care [facility]) - Progression Note    Patient Details  Name: Sergio Pennington MRN: [id number] Date of Birth: December 30, 1950  Transition of Care [facility]) CM/SW Contact  Purcell Mouton, RN Phone Number: 06/17/2019, 12:55 PM  Clinical Narrative:     Pt discharging home with wife and [facility].  Expected Discharge Plan: [place] Barriers to Discharge: No Barriers Identified  Expected Discharge Plan and Services Expected Discharge Plan: [place]   Discharge Planning Services: CM Consult Post Acute Care Choice: [place] arrangements for the past 2 months: Single Family Home                           HH Arranged: PT, OT, RN [facility] Agency: [facility] Date [place]: 06/15/19 Time [place]: 1622     Social Determinants of Health (SDOH) Interventions    Readmission Risk Interventions No flowsheet data found.

## 2019-06-17 NOTE — Discharge Summary (Signed)
Physician Discharge Summary  Sergio Pennington RSW:[id number] DOB: 01-19-1951 DOA: 06/11/2019  PCP: Darreld Mclean, MD  Admit date: 06/11/2019 Discharge date: 06/17/2019  Admitted From: Home Disposition:  home  Recommendations for Outpatient Follow-up:   1. Doxycycline 100 mg twice daily prescribed to complete a total of 14 days for MRSA UTI and possible bacteremia (end date 06/27/2019)  2. INR at time of discharge 2.1.  Advised to take Coumadin 5 mg daily with close follow-up with PCP for INR check.  3. During hospitalization, when his INR was subtherapeutic at 1.9 he was bridged with Lovenox 1 mg/kg for a day given high risk of VTE with h/o recent DVT and malignancy.  He had some hematuria from his right nephrostomy tube.  This could be easily flushed.  Discussed risks and benefits of continuing anticoagulation.  Patient understands this and is agreeable to continue anticoagulation at this time with close follow-up for INR check.  4. Recommend home with hospice discussion in case he further declines as outpatient to prevent future hospitalizations as the patient and his wife were very open to the idea of hospice if he were to go downhill from this point.  Per oncology, his prognosis remains poor from malignancy standpoint and patient opted to not continue with further chemotherapy at this time.  He was offered narcotics for pain control but deferred at this time.  Home Health: Yes PT/OT/RN Equipment/Devices: None  Discharge Condition: Fair CODE STATUS: DNR Diet recommendation: Regular diet  Brief/Interim Summary: 69 year old Caucasian male with PMH of bladder cancer s/p urostomy/ileostomy (by Dr. 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  CT abdomen pelvis showed moderate bilateral hydroureteronephrosis increased in comparison to previous images with increasing periureteral stranding.  IR placed nephrostomy tubes on 06/13/2018 following which his creatinine has nicely down trended to 1.26 on the day of discharge.  He grew MRSE on urine culture from 06/11/2019.  This was initially treated as a contaminant.  Following nephrostomy tube placement, he had a fever of 103 with severe rigors.  At the time repeat blood cultures were drawn and he was started on daptomycin and cefepime.  As the blood cultures remained negative, antibiotics were deescalated to doxycycline to complete a total of 14 days.  Dr. Talbot Grumbling with ID was called for recommendations. He was also seen by Dr. Alen Blew with oncology who recommended continued supportive care.  Noted his prognosis remains poor from malignancy standpoint.  Patient opted to not proceed with any more therapy at this time.  Palliative care was consulted during this hospitalization.  Patient shows to be DNR and on initial presentation wanted to pursue the hospice route but as he improved, he opted to get home health with PT/OT on discharge with subsequent tentative plans for home with hospice.  This plan was discussed with patient and his wife and both are in agreement.   Discharge Diagnoses:  Principal Problem:   AKI (acute kidney injury) ([place]) Active Problems:   Bladder cancer ([place])   COVID-19 virus infection   CKD (chronic kidney disease), stage III   DVT (deep venous thrombosis) [facility])   Discharge Instructions:  Discharge Instructions    Diet - low sodium heart healthy  Complete by: As directed    Increase activity slowly   Complete by: As directed      Allergies as of 06/17/2019      Reactions   Sulfa Antibiotics Hives      Medication List    STOP taking these medications    patiromer 8.4 g packet Commonly known as: VELTASSA     TAKE these medications   acetaminophen 500 MG tablet Commonly known as: TYLENOL Take 2 tablets (1,000 mg total) by mouth every 8 (eight) hours as needed for mild pain, moderate pain, fever or headache. What changed:   when to take this  reasons to take this   ascorbic acid 500 MG tablet Commonly known as: VITAMIN C Take 1 tablet (500 mg total) by mouth 2 (two) times daily for 14 days.   doxycycline 100 MG tablet Commonly known as: VIBRA-TABS Take 1 tablet (100 mg total) by mouth every 12 (twelve) hours.   magnesium oxide 400 MG tablet Commonly known as: MAG-OX TAKE 1 TABLET BY MOUTH EVERY DAY   MiraLax 17 GM/SCOOP powder Generic drug: polyethylene glycol powder Take 1 Container by mouth daily.   ondansetron 4 MG disintegrating tablet Commonly known as: Zofran ODT 4mg  ODT q4 hours prn nausea/vomit What changed:   how much to take  how to take this  when to take this  reasons to take this  additional instructions   PROBIOTIC DAILY PO Take 1 capsule by mouth daily.   prochlorperazine 10 MG tablet Commonly known as: COMPAZINE TAKE 1 TABLET BY MOUTH EVERY 6 HOURS AS NEEDED FOR NAUSEA OR VOMITING. What changed: See the new instructions.   senna-docusate 8.6-50 MG tablet Commonly known as: Senna S Take 1 tablet by mouth 2 (two) times daily. What changed:   when to take this  reasons to take this   VITAMIN B-12 PO Take 4,000 mcg by mouth daily.   Vitamin D3 125 MCG (5000 UT) Caps Take 5,000 Units by mouth daily.   warfarin 5 MG tablet Commonly known as: Coumadin Take 1 tablet (5 mg total) by mouth daily.   Zinc 30 MG Tabs Take 1 tablet by mouth daily.      Follow-up Information    [facility].   Specialty: Home Health Services Why: Please feel free to call for any home health questions Contact information: [place] [address] [phone]           Allergies  Allergen Reactions  . Sulfa Antibiotics Hives    Consultations:  Palliative care, interventional radiology, oncology   Procedures/Studies: CT ABDOMEN PELVIS WO CONTRAST  Result Date: 06/11/2019 CLINICAL DATA:  Worsening renal function, recent admission for weakness and COVID-19 infection EXAM: CT ABDOMEN AND PELVIS WITHOUT CONTRAST TECHNIQUE: Multidetector CT imaging of the abdomen and pelvis was performed following the standard protocol without IV contrast. COMPARISON:  CT 05/27/2019 FINDINGS: Lower chest: Right pleural effusion, slightly increased in size from comparison exam. Adjacent atelectatic change. Subpleural regions of fibrosis are similar to prior. Reticulonodular opacities in the anterior left lower lobe as well as calcified nodularity in the right lung base are similar to prior. Hepatobiliary: Likely focal fatty infiltration near the false form ligament. Smooth hepatic surface contour. No visible or contour deforming hepatic lesions. Layering density within the gallbladder may reflect biliary sludge. No biliary ductal dilatation or calcified gallstones. No pericholecystic inflammation. Pancreas: Unremarkable. No pancreatic ductal dilatation or surrounding inflammatory changes. Spleen: Normal in size without focal abnormality.  Adrenals/Urinary Tract: Normal adrenal gland. Redemonstration of a complex cystic lesion arising from the lower pole right kidney measuring up to 2.8 x 4.1 x 3.7 cm in size with some internal calcified septation noted. Moderate bilateral hydroureteronephrosis is slightly increased from comparison with increasing Peri ureteral stranding and some probable urothelial thickening to the level of what appears to be an ileal conduit and urostomy in the right lower quadrant. No distension of the urinary conduit system at this time. Patient is post cysto prostatectomy. Irregular soft tissue is noted in the surgical bed including in amorphous region of soft  tissue attenuation measuring up to 7.4 x 2.8 cm, previously 6.8 x 2.6 cm. Stomach/Bowel: Distal esophagus, stomach and duodenum are unremarkable. No small bowel dilatation or wall thickening. Small bowel anastomosis present in the left lower quadrant adjacent the ileal conduit. Difficult to fully ascertain the configuration of the bowel in this region due to lack of intravenous contrast or enteric contrast media. Some small amount of fecalization adjacent the conduit could reflect slowed transit in this location but without resultant upstream obstruction. There is a normal appendix in the right lower quadrant. No colonic dilatation or wall thickening. Few scattered distal colonic diverticula are present. No diverticular inflammation is seen to suggest active diverticulitis Vascular/Lymphatic: Atherosclerotic calcification of the aorta and branch vessels. No aneurysm or ectasia. Retroperitoneal and periaortic adenopathy is similar to comparison. Largest nodes include a 16 mm node at the level of the bifurcation (2/51) a 14 mm right aortocaval node 2/36 and a 15 mm left periaortic node (2/42). Extensive pathologically enlarged pelvic adenopathy is also present, largest is a multilobular nodal conglomerate in the left inguinal region measuring to 2.5 by 5.3 cm, more confluent in appearance from the comparison study and measuring only 4.2 x 2.7 cm at that time. Reproductive: Post cysto prostatectomy, as detailed above. Irregular soft tissue attenuation in the operative site of the pelvis, as detailed above. Other: Increasing circumferential body wall edema is noted. Fat containing bilateral inguinal hernias are again seen but there are postsurgical changes of the ventral midline abdomen are again noted. Overall increased volume of ascites, now moderate. Musculoskeletal: Persistent irregular sacral lucency at S3. No new suspicious osseous lesions are clearly evident. Multilevel degenerative changes are present in the  imaged portions of the spine. IMPRESSION: 1. Slight interval increase in the moderate bilateral hydroureteronephrosis with increasing periureteral stranding and some probable urothelial thickening to the level of what appears to be an ileal conduit and urostomy in the right lower quadrant. Findings could reflect the result of infection or obstruction. Correlate with output and clinical symptoms. 2. Other postsurgical changes from cysto prostatectomy with increasing pelvic soft tissue nodularity and extensive retroperitoneal and pelvic adenopathy compatible with worsening metastatic disease. 3. Similar appearance of a complex cystic lesion arising from the lower pole of the right kidney. Previously characterized as Bosniak IIF. 4. Irregular lucency at S3, nonspecific. Could reflect skeletal metastasis however. 5. Worsening features of anasarca including pleural effusion, moderate ascites, circumferential body wall edema. 6. Aortic Atherosclerosis (ICD10-I70.0). Electronically Signed   By: Lovena Le M.D.   On: 06/11/2019 17:23   CT ABDOMEN PELVIS WO CONTRAST  Result Date: 05/27/2019 CLINICAL DATA:  Diarrhea nausea and vomiting history of bladder cancer status post urostomy EXAM: CT ABDOMEN AND PELVIS WITHOUT CONTRAST TECHNIQUE: Multidetector CT imaging of the abdomen and pelvis was performed following the standard protocol without IV contrast. COMPARISON:  05/12/2019, 10/23/2018 FINDINGS: Lower chest: Lung bases demonstrate new small right-sided  pleural effusion. Scattered areas of subpleural fibrosis. Punctate and nodular calcifications at the right lung base as before. Heart size is normal. Coronary vascular calcification. Hepatobiliary: Probable focal fat infiltration near falciform ligament. No calcified gallstone or biliary dilatation Pancreas: Unremarkable. No pancreatic ductal dilatation or surrounding inflammatory changes. Spleen: Normal in size without focal abnormality. Adrenals/Urinary Tract:  Adrenal glands are normal. Complex cystic lesion exophytic to the lower pole right kidney with scattered calcifications, measures up to 5.1 cm. Mild to moderate bilateral hydronephrosis and hydroureter appears slightly worse on the right side compared to most recent prior CT. Hydroureter visualized to ill-defined soft tissue density in the right lower quadrant of the abdomen with surgical suture. Stomach/Bowel: The stomach is nonenlarged. There is no dilated small bowel. Right lower quadrant ostomy. Negative appendix. No colon wall thickening. Vascular/Lymphatic: Nonaneurysmal aorta. Aortic atherosclerosis, moderate. Continued retroperitoneal adenopathy. Left external iliac mass/adenopathy measuring 3.6 cm, grossly unchanged. Pelvic sidewall adenopathy measuring up to 18 mm on the left. Reproductive: Prostate surgically absent. Other: No free air. Small amount of ascites adjacent to the spleen and liver. Ill-defined soft tissue mass in the left lower quadrant/upper pelvis measuring 3.3 x 2.1 cm concerning for metastatic deposit. Irregular soft tissue density within the pelvis measuring approximately 6.8 x 1.6 cm, grossly similar compared to prior. Ill-defined soft tissue thickening and nodularity within the pelvis. Small amount of fluid and ill-defined soft tissue density in the right gutter. Multiple nodules or nodes in the lower abdomen and pelvis. Musculoskeletal: No new abnormality since 05/12/2019. Mild irregular lucency at the S3 segment of the sacrum. IMPRESSION: 1. Status post bladder resection with right lower quadrant urostomy. Mild to moderate bilateral hydronephrosis, grossly stable on the left and slightly worsened on the right since 05/12/2019. 2. New small right pleural effusion and new small amount of perihepatic and perisplenic ascites. 3. Grossly stable retroperitoneal, pelvic sidewall, and iliac adenopathy consistent with metastatic disease. Numerous pelvic nodules. Slight increased fluid and  nodularity in the right gutter with persistent ill-defined soft tissue mass in the left pelvis, all consistent with metastatic disease. Grossly stable lobulated soft tissue density in the pelvis concerning for residual or recurrent disease. Irregular soft tissue density in the right lower quadrant with associated surgical sutures presumably related to history of urostomy surgery, though with slightly masslike features; additional metastatic disease cannot be excluded. Electronically Signed   By: Donavan Foil M.D.   On: 05/27/2019 23:04   XR Chest Single View  Result Date: 05/27/2019 CLINICAL DATA:  Weakness, nausea, vomiting, diarrhea EXAM: PORTABLE CHEST 1 VIEW COMPARISON:  05/12/2019 FINDINGS: There is a right-sided Port-A-Cath. There is right basilar atelectasis. There is no focal consolidation. There is no pleural effusion or pneumothorax. The heart and mediastinal contours are unremarkable. There is no acute osseous abnormality. IMPRESSION: Mild right basilar atelectasis. Otherwise no acute cardiopulmonary disease. Electronically Signed   By: Kathreen Devoid   On: 05/27/2019 19:49   IR NEPHROSTOMY PLACEMENT LEFT  Result Date: 06/14/2019 CLINICAL DATA:  Bladder carcinoma, bilateral hydronephrosis, worsening renal function EXAM: BILATERAL PERCUTANEOUS NEPHROSTOMY CATHETER PLACEMENT UNDER ULTRASOUND AND FLUOROSCOPIC GUIDANCE FLUOROSCOPY TIME:  108 seconds; 12 mGy TECHNIQUE: The procedure, risks (including but not limited to bleeding, infection, organ damage ), benefits, and alternatives were explained to the patient. Questions regarding the procedure were encouraged and answered. The patient understands and consents to the procedure. Bilateral flank regions prepped with chlorhexidine, draped in usual sterile fashion, infiltrated locally with 1% lidocaine. Intravenous Fentanyl 112mcg and Versed 2mg  were  administered as conscious sedation during continuous monitoring of the patient's level of consciousness  and physiological / cardiorespiratory status by the radiology RN, with a total moderate sedation time of 31 minutes. Under real-time ultrasound guidance, a 21-gauge trocar needle was advanced into a posterior lower pole right renal calyx. Ultrasound image documentation was saved. Urine spontaneously returned through the needle. Guidewire advanced. Passage into the proximal ureter was confirmed under fluoroscopy. Needle was exchanged over a guidewire for transitional dilator. Contrast injection confirmed appropriate positioning. Catheter was exchanged over a guidewire for a 10 French pigtail catheter, formed centrally within the right renal collecting system. Contrast injection confirms appropriate positioning and patency. In similar fashion, on the left, Under real-time ultrasound guidance, a 21-gauge trocar needle was advanced into a posterior lower pole left renal calyx. Ultrasound image documentation was saved. Urine spontaneously returned through the needle. Guidewire advanced. Passage into the proximal ureter was confirmed under fluoroscopy. Needle was exchanged over a guidewire for transitional dilator. Contrast injection confirmed appropriate positioning. Catheter was exchanged over a guidewire for a 10 French pigtail catheter, formed centrally within the left renal collecting system. Contrast injection confirms appropriate positioning and patency. Both catheters secured externally with 0 Prolene sutures and StatLock, and placed to external drain bags. The patient tolerated the procedure well. COMPLICATIONS: COMPLICATIONS none IMPRESSION: 1. Technically successful bilateral percutaneous nephrostomy catheter placement. Electronically Signed   By: Lucrezia Europe M.D.   On: 06/14/2019 14:17   IR NEPHROSTOMY PLACEMENT RIGHT  Result Date: 06/14/2019 CLINICAL DATA:  Bladder carcinoma, bilateral hydronephrosis, worsening renal function EXAM: BILATERAL PERCUTANEOUS NEPHROSTOMY CATHETER PLACEMENT UNDER ULTRASOUND AND  FLUOROSCOPIC GUIDANCE FLUOROSCOPY TIME:  108 seconds; 12 mGy TECHNIQUE: The procedure, risks (including but not limited to bleeding, infection, organ damage ), benefits, and alternatives were explained to the patient. Questions regarding the procedure were encouraged and answered. The patient understands and consents to the procedure. Bilateral flank regions prepped with chlorhexidine, draped in usual sterile fashion, infiltrated locally with 1% lidocaine. Intravenous Fentanyl 19mcg and Versed 2mg  were administered as conscious sedation during continuous monitoring of the patient's level of consciousness and physiological / cardiorespiratory status by the radiology RN, with a total moderate sedation time of 31 minutes. Under real-time ultrasound guidance, a 21-gauge trocar needle was advanced into a posterior lower pole right renal calyx. Ultrasound image documentation was saved. Urine spontaneously returned through the needle. Guidewire advanced. Passage into the proximal ureter was confirmed under fluoroscopy. Needle was exchanged over a guidewire for transitional dilator. Contrast injection confirmed appropriate positioning. Catheter was exchanged over a guidewire for a 10 French pigtail catheter, formed centrally within the right renal collecting system. Contrast injection confirms appropriate positioning and patency. In similar fashion, on the left, Under real-time ultrasound guidance, a 21-gauge trocar needle was advanced into a posterior lower pole left renal calyx. Ultrasound image documentation was saved. Urine spontaneously returned through the needle. Guidewire advanced. Passage into the proximal ureter was confirmed under fluoroscopy. Needle was exchanged over a guidewire for transitional dilator. Contrast injection confirmed appropriate positioning. Catheter was exchanged over a guidewire for a 10 French pigtail catheter, formed centrally within the left renal collecting system. Contrast injection  confirms appropriate positioning and patency. Both catheters secured externally with 0 Prolene sutures and StatLock, and placed to external drain bags. The patient tolerated the procedure well. COMPLICATIONS: COMPLICATIONS none IMPRESSION: 1. Technically successful bilateral percutaneous nephrostomy catheter placement. Electronically Signed   By: Lucrezia Europe M.D.   On: 06/14/2019 14:17   VAS [place]  LOWER EXTREMITY VENOUS (DVT)  Result Date: 05/28/2019  Lower Venous Study Indications: Elevated Ddimer.  Risk Factors: COVID 19 positive Cancer. Anticoagulation: Heparin. Limitations: Bowel gas. Comparison Study: No prior studies. Performing Technologist: Oliver Hum RVT  Examination Guidelines: A complete evaluation includes B-mode imaging, spectral Doppler, color Doppler, and power Doppler as needed of all accessible portions of each vessel. Bilateral testing is considered an integral part of a complete examination. Limited examinations for reoccurring indications may be performed as noted.  +---------+---------------+---------+-----------+----------+--------------+ RIGHT    CompressibilityPhasicitySpontaneityPropertiesThrombus Aging +---------+---------------+---------+-----------+----------+--------------+ CFV      Partial        No       No                   Acute          +---------+---------------+---------+-----------+----------+--------------+ SFJ      Full                                                        +---------+---------------+---------+-----------+----------+--------------+ FV Prox  Partial        No       No                   Acute          +---------+---------------+---------+-----------+----------+--------------+ FV Mid   Partial        No       No                   Acute          +---------+---------------+---------+-----------+----------+--------------+ FV DistalPartial        No       No                   Acute           +---------+---------------+---------+-----------+----------+--------------+ PFV                                                   Not visualized +---------+---------------+---------+-----------+----------+--------------+ POP      Full           No       No                                  +---------+---------------+---------+-----------+----------+--------------+ PTV      Full                                                        +---------+---------------+---------+-----------+----------+--------------+ PERO     Full                                                        +---------+---------------+---------+-----------+----------+--------------+ EIV      Full  No       No                                  +---------+---------------+---------+-----------+----------+--------------+ CIV                                                   Not visualized +---------+---------------+---------+-----------+----------+--------------+   +---------+---------------+---------+-----------+----------+--------------+ LEFT     CompressibilityPhasicitySpontaneityPropertiesThrombus Aging +---------+---------------+---------+-----------+----------+--------------+ CFV      Partial        No       No                   Acute          +---------+---------------+---------+-----------+----------+--------------+ SFJ      Full                                                        +---------+---------------+---------+-----------+----------+--------------+ FV Prox  Full                                                        +---------+---------------+---------+-----------+----------+--------------+ FV Mid   Partial        No       No                   Acute          +---------+---------------+---------+-----------+----------+--------------+ FV DistalPartial        No       No                   Acute           +---------+---------------+---------+-----------+----------+--------------+ PFV                                                   Not visualized +---------+---------------+---------+-----------+----------+--------------+ POP      Partial        No       No                   Acute          +---------+---------------+---------+-----------+----------+--------------+ PTV      Partial                                      Acute          +---------+---------------+---------+-----------+----------+--------------+ PERO     Full                                                        +---------+---------------+---------+-----------+----------+--------------+ Gastroc  None                                         Acute          +---------+---------------+---------+-----------+----------+--------------+ SSV      Full                                                        +---------+---------------+---------+-----------+----------+--------------+ EIV      Full           No       No                                  +---------+---------------+---------+-----------+----------+--------------+ CIV                                                   Not visualized +---------+---------------+---------+-----------+----------+--------------+    Summary: Right: Findings consistent with acute deep vein thrombosis involving the right common femoral vein, and right femoral vein. No cystic structure found in the popliteal fossa. The external iliac vein appears patent. Left: Findings consistent with acute deep vein thrombosis involving the left common femoral vein, left femoral vein, left popliteal vein, left posterior tibial veins, and left gastrocnemius veins. No cystic structure found in the popliteal fossa. The external iliac vein appears patent.  *See table(s) above for measurements and observations. Electronically signed by Deitra Mayo MD on 05/28/2019 at 1:28:50 PM.    Final         Subjective: Reports feeling fair.  He would like to go home.  Discharge Exam: Vitals:   06/17/19 1015 06/17/19 1301  BP: (!) 158/89 (!) 168/77  Pulse: 86 78  Resp: 16 16  Temp:  98 F (36.7 C)  SpO2: 96% 95%   Vitals:   06/16/19 2021 06/17/19 0536 06/17/19 1015 06/17/19 1301  BP: (!) 159/81 (!) 156/71 (!) 158/89 (!) 168/77  Pulse: 84 77 86 78  Resp: 20 20 16 16   Temp: 98.1 F (36.7 C) 99.4 F (37.4 C)  98 F (36.7 C)  TempSrc:    Axillary  SpO2: 97% 94% 96% 95%  Weight:      Height:        GENERAL: No apparent distress.  Tired appearing HEENT: MMM.  Vision and hearing grossly intact.  NECK: Supple.  No apparent JVD.  RESP:  No IWOB. Good air movement bilaterally. CVS:  RRR. Heart sounds normal.  ABD/GI/GU: Bowel sounds present. Soft. Non tender.  Red-tinged urine in Ileostomy bag over RLQ.    Almost clear urine in the left nephrostomy bag.  Bloody urine in right nephrostomy bag. MSK/EXT:  Moves extremities. No apparent deformity.  1-2 pitting pedal edema bilaterally SKIN: no apparent skin lesion or wound NEURO: Awake, alert and oriented appropriately.  No apparent focal neuro deficit. PSYCH: Calm. Normal affect.   The results of significant diagnostics from this hospitalization (including imaging, microbiology, ancillary and laboratory) are listed below for reference.     Microbiology: Recent Results (from the past 240 hour(s))  Urine culture     Status: Abnormal   Collection Time: 06/11/19  2:55 PM   Specimen: Urine, Catheterized  Result Value Ref Range Status   Specimen Description   Final    URINE, CATHETERIZED Performed at [facility], [address] [place], [place] [address]    Special Requests   Final    NONE Performed at [facility], [address] [place], [place] [address]    Culture 60,000 COLONIES/mL STAPHYLOCOCCUS EPIDERMIDIS (A)  Final   Report Status 06/13/2019 FINAL  Final   Organism ID, Bacteria  STAPHYLOCOCCUS EPIDERMIDIS (A)  Final      Susceptibility   Staphylococcus epidermidis - MIC*    CIPROFLOXACIN >=8 RESISTANT Resistant     GENTAMICIN <=0.5 SENSITIVE Sensitive     NITROFURANTOIN <=16 SENSITIVE Sensitive     OXACILLIN >=4 RESISTANT Resistant     TETRACYCLINE 4 SENSITIVE Sensitive     VANCOMYCIN 1 SENSITIVE Sensitive     TRIMETH/SULFA 80 RESISTANT Resistant     CLINDAMYCIN >=8 RESISTANT Resistant     RIFAMPIN <=0.5 SENSITIVE Sensitive     Inducible Clindamycin NEGATIVE Sensitive     * 60,000 COLONIES/mL STAPHYLOCOCCUS EPIDERMIDIS  SARS CORONAVIRUS 2 (TAT 6-24 HRS) Nasopharyngeal Nasopharyngeal Swab     Status: None   Collection Time: 06/12/19  2:03 AM   Specimen: Nasopharyngeal Swab  Result Value Ref Range Status   SARS Coronavirus 2 NEGATIVE NEGATIVE Final    Comment: (NOTE) SARS-CoV-2 target nucleic acids are NOT DETECTED. The SARS-CoV-2 RNA is generally detectable in upper and lower respiratory specimens during the acute phase of infection. Negative results do not preclude SARS-CoV-2 infection, do not rule out co-infections with other pathogens, and should not be used as the sole basis for treatment or other patient management decisions. Negative results must be combined with clinical observations, patient history, and epidemiological information. The expected result is Negative. Fact Sheet for Patients: SugarRoll.be Fact Sheet for Healthcare Providers: https://www.woods-mathews.com/ This test is not yet approved or cleared by the [place] FDA and  has been authorized for detection and/or diagnosis of SARS-CoV-2 by FDA under an Emergency Use Authorization (EUA). This EUA will remain  in effect (meaning this test can be used) for the duration of the COVID-19 declaration under Section 56 4(b)(1) of the Act, 21 U.S.C. section 360bbb-3(b)(1), unless the authorization is terminated or revoked sooner. Performed at [facility], [place] [address] [place], [place] [address]   Culture, blood (routine x 2)     Status: None (Preliminary result)   Collection Time: 06/14/19  4:38 PM   Specimen: BLOOD  Result Value Ref Range Status   Specimen Description   Final    BLOOD BLOOD RIGHT ARM Performed at [place] [address] [place], [place] [address]    Special Requests   Final    BOTTLES DRAWN AEROBIC AND ANAEROBIC Blood Culture adequate volume Performed at [place] [address], [place], [place] [address]    Culture   Final    NO GROWTH 3 DAYS Performed at [facility], [place] [address] [place], [place] [address]    Report Status PENDING  Incomplete  Culture, blood (routine x 2)     Status: None (Preliminary result)   Collection Time: 06/14/19  4:38 PM   Specimen: BLOOD  Result Value Ref Range Status   Specimen Description   Final    BLOOD BLOOD RIGHT  HAND Performed at [facility], [place] [address], [place], [place] [address]    Special Requests   Final    BOTTLES DRAWN AEROBIC AND ANAEROBIC Blood Culture adequate volume Performed at [facility] [address], [place], [place] [address]    Culture   Final    NO GROWTH 3 DAYS Performed at [facility], [place] [address], [place], [place] [address]    Report Status PENDING  Incomplete     Labs: BNP (last 3 results) No results for input(s): BNP in the last 8760 hours. Basic Metabolic Panel: Recent Labs  Lab 06/13/19 0305 06/13/19 0305 06/14/19 0650 06/14/19 1549 06/15/19 0500 06/16/19 0414 06/17/19 0516 06/17/19 0709  NA 136   < > 136 137 137 136  --  136  K 4.7   < > 5.3* 5.4* 4.8 4.3  --  3.9  CL 104   < > 102 104 103 100  --  98  CO2 21*   < > 19* 18* 23 25  --  27  GLUCOSE 143*   < > 144* 154* 157* 166*  --  136*  BUN 61*   < > 67* 63* 49* 36*  --  28*  CREATININE 3.11*   < > 4.47* 4.31* 2.58* 1.57*  --  1.26*  CALCIUM 8.4*   <  > 8.6* 8.8* 8.6* 8.7*  --  8.7*  MG 1.8  --  1.6*  --  1.8 1.5* 1.9  --   PHOS 4.7*  --  5.0*  --  4.0 2.6  --  2.4*   < > = values in this interval not displayed.   Liver Function Tests: Recent Labs  Lab 06/11/19 1446 06/13/19 0305 06/14/19 0650 06/14/19 1549 06/15/19 0500 06/16/19 0414 06/17/19 0709  AST 19  --   --  21  --   --   --   ALT 23  --   --  23  --   --   --   ALKPHOS 72  --   --  73  --   --   --   BILITOT 0.3  --   --  0.6  --   --   --   PROT 6.0*  --   --  6.6  --   --   --   ALBUMIN 2.9*   < > 3.2* 3.2* 3.0* 2.9* 2.9*   < > = values in this interval not displayed.   Recent Labs  Lab 06/11/19 1446  LIPASE 20   No results for input(s): AMMONIA in the last 168 hours. CBC: Recent Labs  Lab 06/11/19 1446 06/12/19 0500 06/13/19 0305 06/14/19 0650 06/15/19 0500 06/16/19 0414 06/17/19 0516  WBC 11.4*   < > 10.0 15.7* 14.6* 13.6* 11.8*  NEUTROABS 9.4*  --   --   --   --   --   --   HGB 8.0*   < > 8.2* 9.1* 8.6* 8.8* 8.8*  HCT 25.2*   < > 26.0* 28.5* 26.9* 27.6* 27.5*  MCV 93.7   < > 90.6 89.9 90.6 90.2 89.3  PLT 243   < > 239 260 162 181 198   < > = values in this interval not displayed.   Cardiac Enzymes: Recent Labs  Lab 06/14/19 1638  CKTOTAL 20*   BNP: Invalid input(s): POCBNP CBG: No results for input(s): GLUCAP in the last 168 hours. D-Dimer No results for input(s): DDIMER in  the last 72 hours. Hgb A1c No results for input(s): HGBA1C in the last 72 hours. Lipid Profile No results for input(s): CHOL, HDL, LDLCALC, TRIG, CHOLHDL, LDLDIRECT in the last 72 hours. Thyroid function studies No results for input(s): TSH, T4TOTAL, T3FREE, THYROIDAB in the last 72 hours.  Invalid input(s): FREET3 Anemia work up No results for input(s): VITAMINB12, FOLATE, FERRITIN, TIBC, IRON, RETICCTPCT in the last 72 hours. Urinalysis    Component Value Date/Time   COLORURINE YELLOW 06/11/2019 1600   APPEARANCEUR CLEAR 06/11/2019 1600   LABSPEC 1.010  06/11/2019 1600   PHURINE 6.5 06/11/2019 1600   GLUCOSEU NEGATIVE 06/11/2019 1600   GLUCOSEU > or = 1000 mg/dL (AA) 03/01/2008 1109   HGBUR SMALL (A) 06/11/2019 1600   BILIRUBINUR NEGATIVE 06/11/2019 1600   KETONESUR NEGATIVE 06/11/2019 1600   PROTEINUR NEGATIVE 06/11/2019 1600   UROBILINOGEN 0.2 mg/dL 03/01/2008 1109   NITRITE NEGATIVE 06/11/2019 1600   LEUKOCYTESUR SMALL (A) 06/11/2019 1600   Sepsis Labs Invalid input(s): PROCALCITONIN,  WBC,  LACTICIDVEN Microbiology Recent Results (from the past 240 hour(s))  Urine culture     Status: Abnormal   Collection Time: 06/11/19  2:55 PM   Specimen: Urine, Catheterized  Result Value Ref Range Status   Specimen Description   Final    URINE, CATHETERIZED Performed at [facility], [address] [place], [place] [address]    Special Requests   Final    NONE Performed at [facility], [address] [place], [place] [address]    Culture 60,000 COLONIES/mL STAPHYLOCOCCUS EPIDERMIDIS (A)  Final   Report Status 06/13/2019 FINAL  Final   Organism ID, Bacteria STAPHYLOCOCCUS EPIDERMIDIS (A)  Final      Susceptibility   Staphylococcus epidermidis - MIC*    CIPROFLOXACIN >=8 RESISTANT Resistant     GENTAMICIN <=0.5 SENSITIVE Sensitive     NITROFURANTOIN <=16 SENSITIVE Sensitive     OXACILLIN >=4 RESISTANT Resistant     TETRACYCLINE 4 SENSITIVE Sensitive     VANCOMYCIN 1 SENSITIVE Sensitive     TRIMETH/SULFA 80 RESISTANT Resistant     CLINDAMYCIN >=8 RESISTANT Resistant     RIFAMPIN <=0.5 SENSITIVE Sensitive     Inducible Clindamycin NEGATIVE Sensitive     * 60,000 COLONIES/mL STAPHYLOCOCCUS EPIDERMIDIS  SARS CORONAVIRUS 2 (TAT 6-24 HRS) Nasopharyngeal Nasopharyngeal Swab     Status: None   Collection Time: 06/12/19  2:03 AM   Specimen: Nasopharyngeal Swab  Result Value Ref Range Status   SARS Coronavirus 2 NEGATIVE NEGATIVE Final    Comment: (NOTE) SARS-CoV-2 target nucleic acids are NOT DETECTED. The  SARS-CoV-2 RNA is generally detectable in upper and lower respiratory specimens during the acute phase of infection. Negative results do not preclude SARS-CoV-2 infection, do not rule out co-infections with other pathogens, and should not be used as the sole basis for treatment or other patient management decisions. Negative results must be combined with clinical observations, patient history, and epidemiological information. The expected result is Negative. Fact Sheet for Patients: SugarRoll.be Fact Sheet for Healthcare Providers: https://www.woods-mathews.com/ This test is not yet approved or cleared by the [place] FDA and  has been authorized for detection and/or diagnosis of SARS-CoV-2 by FDA under an Emergency Use Authorization (EUA). This EUA will remain  in effect (meaning this test can be used) for the duration of the COVID-19 declaration under Section 56 4(b)(1) of the Act, 21 U.S.C. section 360bbb-3(b)(1), unless the authorization is terminated or revoked sooner. Performed at  [facility], [place] [address], [place], [place] [address]   Culture, blood (routine x 2)     Status: None (Preliminary result)   Collection Time: 06/14/19  4:38 PM   Specimen: BLOOD  Result Value Ref Range Status   Specimen Description   Final    BLOOD BLOOD RIGHT ARM Performed at [place] [address] [place], [place] [address]    Special Requests   Final    BOTTLES DRAWN AEROBIC AND ANAEROBIC Blood Culture adequate volume Performed at [place] [address] [place], [place] [address]    Culture   Final    NO GROWTH 3 DAYS Performed at [facility], [place] [address], [place], [place] [address]    Report Status PENDING  Incomplete  Culture, blood (routine x 2)     Status: None (Preliminary result)   Collection Time: 06/14/19  4:38 PM   Specimen: BLOOD  Result Value Ref Range Status    Specimen Description   Final    BLOOD BLOOD RIGHT HAND Performed at [place] [address] [place], [place] [address]    Special Requests   Final    BOTTLES DRAWN AEROBIC AND ANAEROBIC Blood Culture adequate volume Performed at [place] [address], [place], [place] [address]    Culture   Final    NO GROWTH 3 DAYS Performed at [facility], [place] [address] [place], [place] [address]    Report Status PENDING  Incomplete     Time coordinating discharge: Over 30 minutes  SIGNED:   Lucky Cowboy, MD  [facility] 06/17/2019, 3:49 PM  If 7PM-7AM, please contact night-coverage

## 2019-06-18 ENCOUNTER — Encounter: Payer: Self-pay | Admitting: Family Medicine

## 2019-06-19 ENCOUNTER — Ambulatory Visit: Payer: Medicare Other

## 2019-06-19 LAB — CULTURE, BLOOD (ROUTINE X 2)
Culture: NO GROWTH
Culture: NO GROWTH
Special Requests: ADEQUATE
Special Requests: ADEQUATE

## 2019-06-21 ENCOUNTER — Encounter: Payer: Self-pay | Admitting: Oncology

## 2019-06-21 ENCOUNTER — Encounter: Payer: Self-pay | Admitting: Family Medicine

## 2019-06-22 ENCOUNTER — Other Ambulatory Visit: Payer: Self-pay

## 2019-06-23 ENCOUNTER — Telehealth: Payer: Self-pay

## 2019-06-23 ENCOUNTER — Telehealth: Payer: Self-pay | Admitting: Family Medicine

## 2019-06-23 NOTE — Telephone Encounter (Signed)
Received call from [facility] with [facility] stating patient is requesting hospice services. Patient is currently receiving home health from them but would like to transition to hospice.  Verbal order for hospice services received from Dr. Alen Blew and relayed to [facility].  Dr. Alen Blew has requested to not be the attending for hospice services.  Jenn with [facility] to set up hospice services and discuss with patient and family.

## 2019-06-23 NOTE — Telephone Encounter (Signed)
Donna,RN with [facility] called states she would like to report Patient's INR. 2. Butch Penny, Rn would like to when patient should have INR and coumadin checked or evaluated. 3.  Donna,Rn would like  Dr to advise her on when to give medications to patient.   INR result :  3.3   Butch Penny: [phone],SUNH

## 2019-06-23 NOTE — Telephone Encounter (Signed)
Called Butch Penny- I have communicated coumadin adjustment instructions to pt and his wife.  Recheck INR in one week

## 2019-06-23 NOTE — Telephone Encounter (Signed)
Caller Name: Sergio Pennington w/[facility] Phone: [phone]  Sergio Pennington is requesting for Dr. Lorelei Pont to be attending physician. Pt was scheduled to be admitted today and has appt at 12:30pm. She is requesting call before if possible. Landry Dyke that Dr. Lorelei Pont is out of the office today.

## 2019-06-29 ENCOUNTER — Encounter: Payer: Self-pay | Admitting: Oncology

## 2019-06-30 ENCOUNTER — Encounter: Payer: Self-pay | Admitting: Family Medicine

## 2019-06-30 NOTE — Telephone Encounter (Signed)
Cassie calling  To see if you were able to go to their online portal to put in request for the wheelchair. Cassie stated if you needed help she can walk you thru if you call her [phone].  Instead of faxing. Thanks

## 2019-07-01 NOTE — Telephone Encounter (Signed)
Tried calling cassie back, no answer left message stating I did complete the order request through the online portal.

## 2019-07-06 ENCOUNTER — Other Ambulatory Visit: Payer: Medicare Other

## 2019-07-07 ENCOUNTER — Encounter: Payer: Self-pay | Admitting: Family Medicine

## 2019-07-11 ENCOUNTER — Other Ambulatory Visit: Payer: Self-pay | Admitting: Oncology

## 2019-07-14 ENCOUNTER — Encounter: Payer: Self-pay | Admitting: Family Medicine

## 2019-07-21 ENCOUNTER — Encounter: Payer: Self-pay | Admitting: Family Medicine

## 2019-07-28 ENCOUNTER — Encounter: Payer: Self-pay | Admitting: Family Medicine

## 2019-08-03 ENCOUNTER — Encounter: Payer: Self-pay | Admitting: Family Medicine

## 2019-08-04 ENCOUNTER — Encounter: Payer: Self-pay | Admitting: Family Medicine

## 2019-08-09 ENCOUNTER — Encounter: Payer: Self-pay | Admitting: Oncology

## 2019-08-09 ENCOUNTER — Encounter: Payer: Self-pay | Admitting: Family Medicine

## 2019-08-10 ENCOUNTER — Telehealth: Payer: Self-pay | Admitting: Family Medicine

## 2019-08-10 NOTE — Telephone Encounter (Signed)
Death certificated has been dropped off to be filled out By Dr, Edilia Bo . Once filled out call [phone] & fax to [phone] . 3/15/21dt

## 2019-08-10 NOTE — Telephone Encounter (Signed)
Death Certificate was dropped off to be filled out . Please call [phone] & fax [phone] once completed . 3/15/21dt

## 2019-08-11 NOTE — Telephone Encounter (Signed)
I have not seen it yet either.  Glad to complete asap once received

## 2019-08-11 NOTE — Telephone Encounter (Signed)
Did you receive? I don't recall it being in the folder up front yesterday.

## 2019-08-12 ENCOUNTER — Telehealth: Payer: Self-pay | Admitting: Family Medicine

## 2019-08-12 NOTE — Telephone Encounter (Signed)
Received his death certificate and completed today

## 2019-08-20 IMAGING — CT NM PET TUM IMG INITIAL (PI) SKULL BASE T - THIGH
7 of 8 series · 23 of 25 positions shown · non-contrast
Comparison: None

CLINICAL DATA: Initial treatment strategy for bladder cancer.

EXAM:
NUCLEAR MEDICINE PET SKULL BASE TO THIGH
TECHNIQUE: 9.09 mCi F-18 FDG was injected intravenously. Full-ring PET imaging
was performed from the skull base to thigh after the radiotracer. CT
data was obtained and used for attenuation correction and anatomic
localization.
Fasting blood glucose: 168 mg/dl

[Series 3: pet sk_thigh ac · axial · 5.0mm · 4.07mm/px · z∈[-884,+108]mm · 5 of 249 slices shown]
[im 1/249]
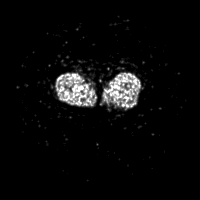
[im 63/249]
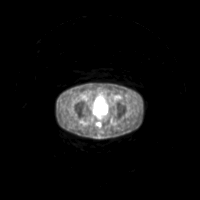
[im 125/249]
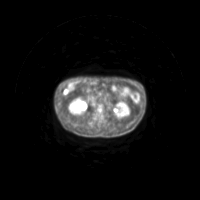
[im 187/249]
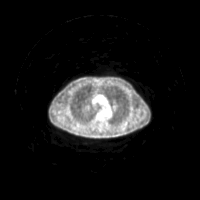
[im 249/249]
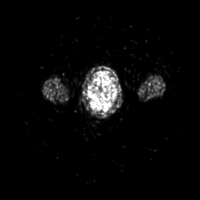

[Series 4: ct sk_thigh 5.0 b31f · axial · 5.0mm · 0.98mm/px · z∈[-884,+108]mm · 4 of 249 slices shown]
[im 1/249]
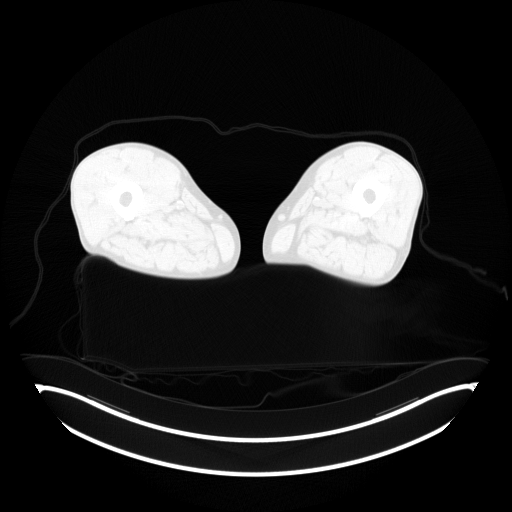
[im 125/249]
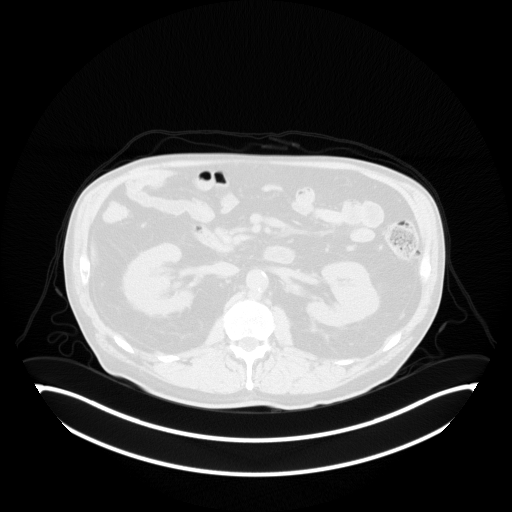
[im 187/249]
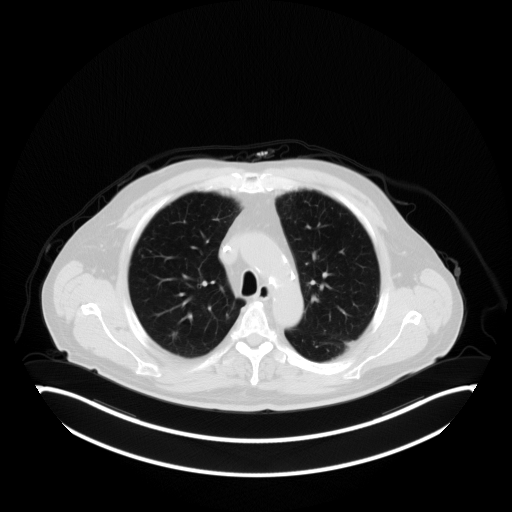
[im 249/249  brain]
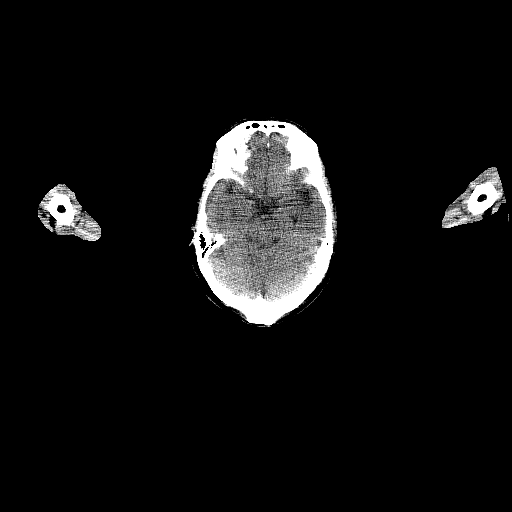

[Series 5: pet sk_thigh nac · axial · 5.0mm · 4.07mm/px · z∈[-884,+108]mm · 5 of 249 slices shown]
[im 1/249]
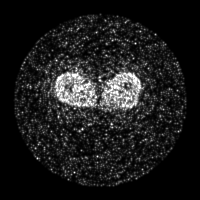
[im 63/249]
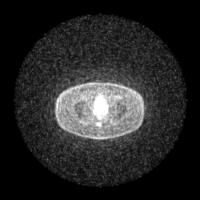
[im 125/249]
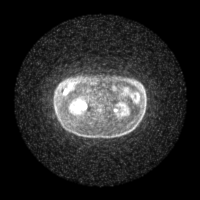
[im 187/249]
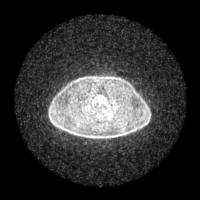
[im 249/249]
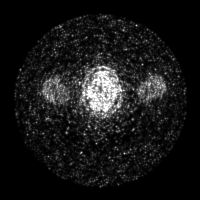

[Series 9: ct sk_thigh 5.0 b70f (id)_bone · axial · 5.0mm · 0.73mm/px · 1 of 68 slices shown]
[im 1/68  bone]
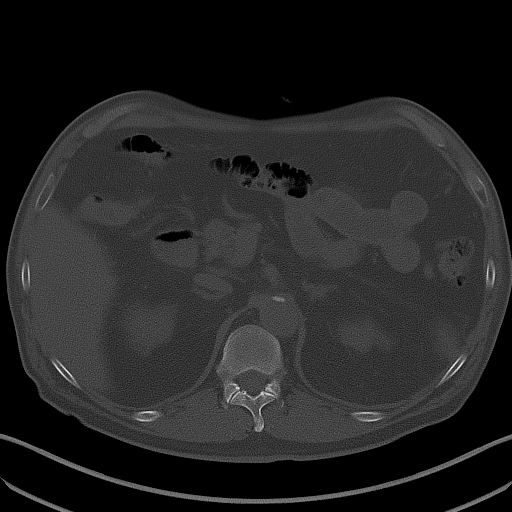

[Series 603: range-ct sk_thigh 5.0 (id)<alpha range> · 2 of 73 slices shown (1 of 2)]
[im 1/73]
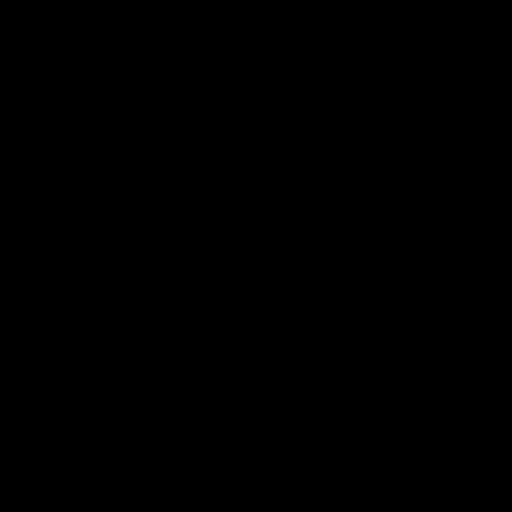
[im 73/73]
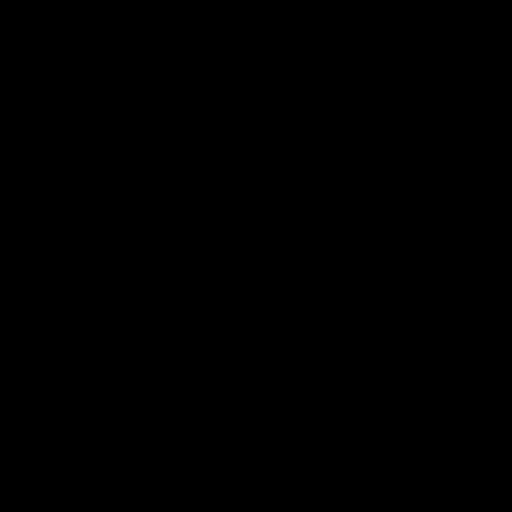

[Series 605: range-ct sk_thigh 5.0 (id)<alpha range> · 5 of 242 slices shown (2 of 2)]
[im 1/242]
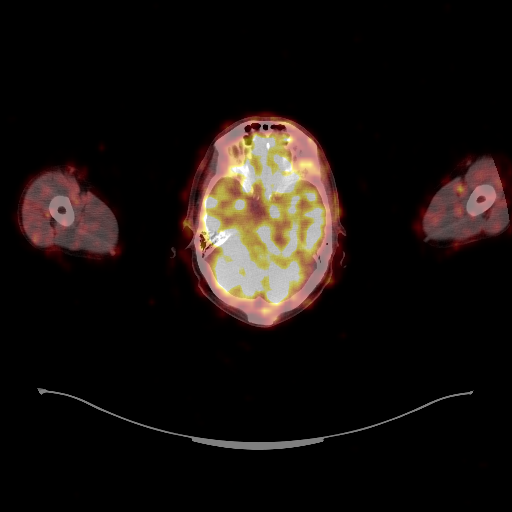
[im 61/242]
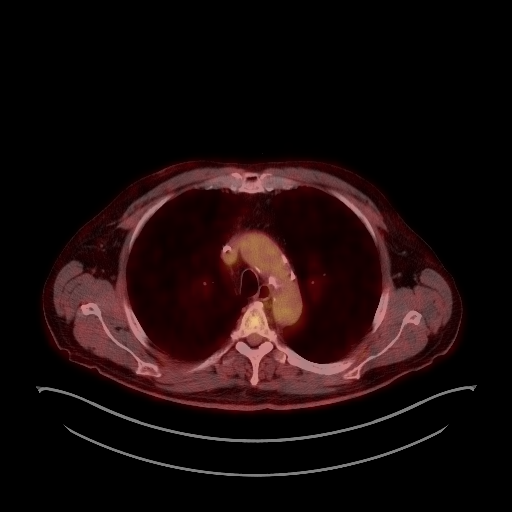
[im 121/242]
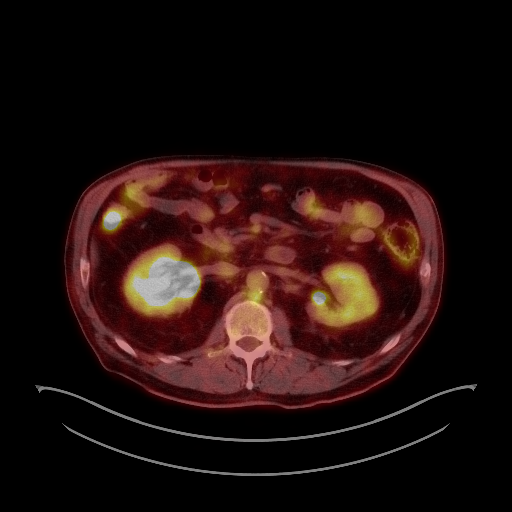
[im 181/242]
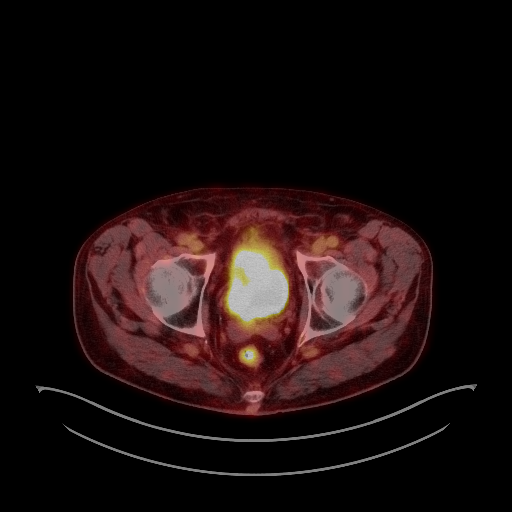
[im 242/242]
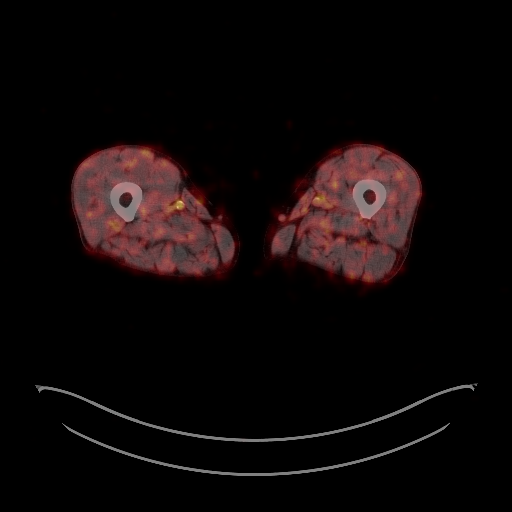

[Series 1077: results mm oncology reading · 1.0mm · 0.50mm/px · 1 of 8 slices shown]
[im 1/8]
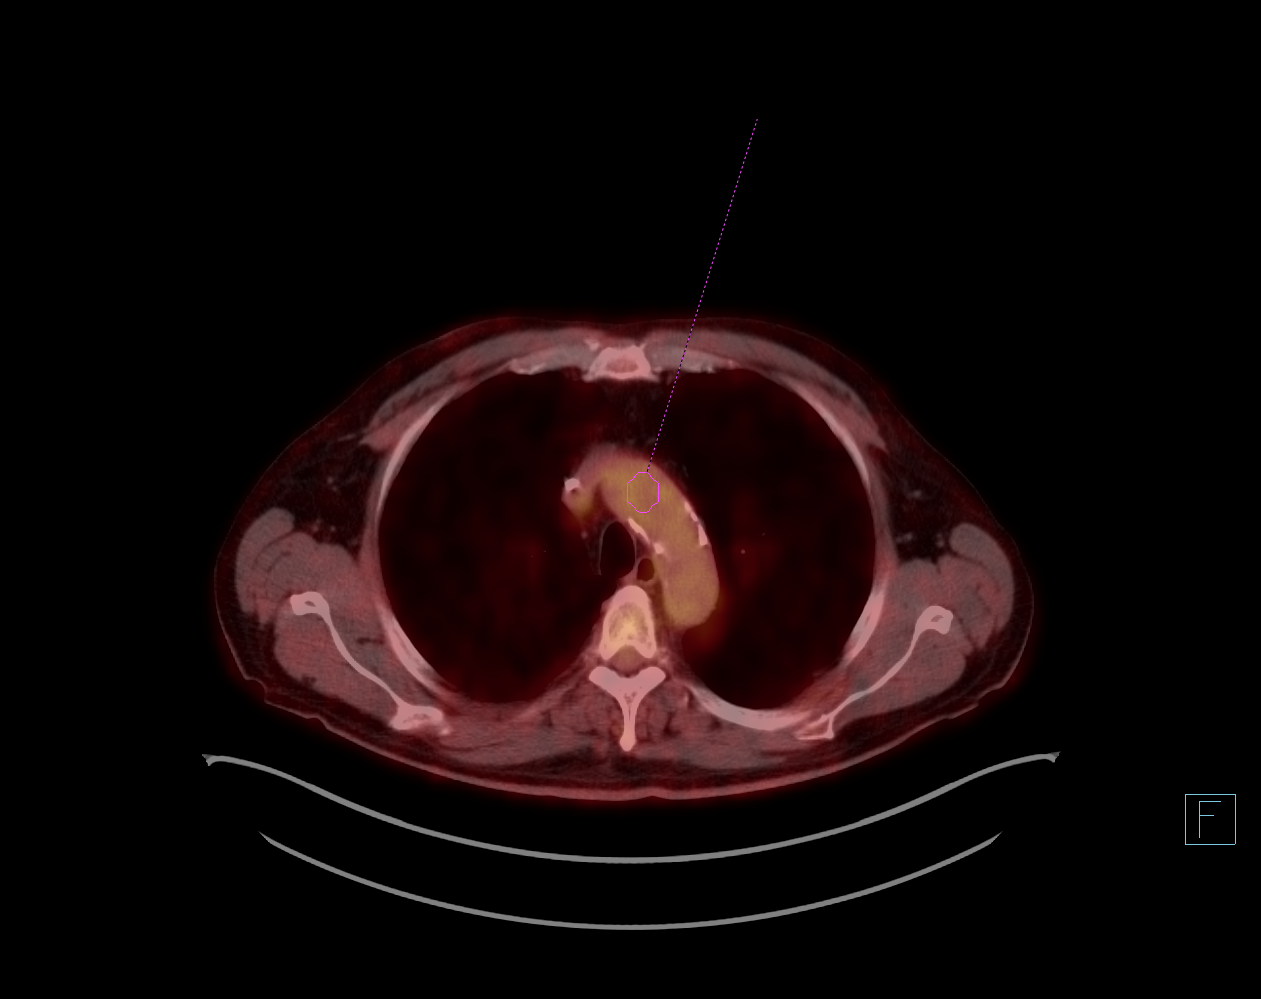

[23 of 25 positions shown; findings below may reference images not displayed]

FINDINGS: Mediastinal blood pool activity: SUV max

NECK: No hypermetabolic lymph nodes in the neck.

Incidental CT findings: none

CHEST: No hypermetabolic supraclavicular, axillary, mediastinal or
hilar lymph nodes. No pleural effusion identified. Small nodule in
the posterior left lower lobe measures 4 mm, image 35/9. Too small
to characterize by PET-CT. Scattered scar like densities are
identified in both lungs. No pleural effusion.

Incidental CT findings: Aortic atherosclerosis. Calcification in the
LAD coronary artery noted.

ABDOMEN/PELVIS: No abnormal uptake identified within the liver.
Normal right adrenal gland.

No abnormal uptake within the adrenal glands. Intense radiotracer
uptake associated with the bladder mass has an SUV max of 7.45.

Retroperitoneal and pelvic adenopathy is again identified. Index
left retroperitoneal lymph node measures 1.2 cm within SUV max of
5.69. Right common iliac lymph node measures 1.4 cm and has an SUV
max of 6.20. Left external iliac lymph node measures 1.5 cm and has
an SUV max of 5.39. Right external iliac lymph node measures 1.2 cm
within SUV max of 6.12. No hypermetabolic inguinal lymph nodes.

Incidental CT findings: Aortic atherosclerosis. Left adrenal adenoma
is again noted measuring 1.5 cm. Previously characterized Bosniak
category 2F lesion in the right inferior pole is again noted.

SKELETON: No focal hypermetabolic activity to suggest skeletal
metastasis.

Incidental CT findings: none
IMPRESSION: 1. Large bladder mass is again noted exhibiting moderate increased
radiotracer uptake compatible with primary urothelial carcinoma.
2. Enlarged and hypermetabolic retroperitoneal and bilateral iliac
lymph nodes compatible with metastatic adenopathy.
3. Left adrenal gland adenoma.
4. Aortic Atherosclerosis ([id number]). Lad coronary artery
atherosclerotic calcifications noted.

## 2019-08-27 DEATH — deceased

## 2019-09-03 ENCOUNTER — Other Ambulatory Visit: Payer: Medicare Other

## 2019-09-03 ENCOUNTER — Ambulatory Visit: Payer: Medicare Other | Admitting: Oncology
